# Patient Record
Sex: Male | Born: 1955 | Race: White | Hispanic: No | Marital: Married | Smoking: Never smoker
Health system: Southern US, Community
[De-identification: ages and names within clinical notes are randomized; demographics above are authoritative.]

## PROBLEM LIST (undated history)

## (undated) DIAGNOSIS — N400 Enlarged prostate without lower urinary tract symptoms: Secondary | ICD-10-CM

## (undated) DIAGNOSIS — K5190 Ulcerative colitis, unspecified, without complications: Secondary | ICD-10-CM

## (undated) DIAGNOSIS — K219 Gastro-esophageal reflux disease without esophagitis: Secondary | ICD-10-CM

## (undated) DIAGNOSIS — I10 Essential (primary) hypertension: Secondary | ICD-10-CM

## (undated) HISTORY — PX: TONSILLECTOMY: SUR1361

## (undated) HISTORY — PX: KNEE ARTHROSCOPY AND ARTHROTOMY: SUR84

---

## 2007-08-26 ENCOUNTER — Ambulatory Visit: Payer: Self-pay | Admitting: Surgery

## 2007-08-30 ENCOUNTER — Ambulatory Visit: Payer: Self-pay | Admitting: Surgery

## 2007-10-20 ENCOUNTER — Emergency Department: Payer: Self-pay | Admitting: Emergency Medicine

## 2007-10-20 ENCOUNTER — Other Ambulatory Visit: Payer: Self-pay

## 2008-02-16 ENCOUNTER — Ambulatory Visit: Payer: Self-pay | Admitting: Specialist

## 2008-10-16 ENCOUNTER — Ambulatory Visit: Payer: Self-pay | Admitting: Gastroenterology

## 2009-03-10 ENCOUNTER — Emergency Department: Payer: Self-pay | Admitting: Emergency Medicine

## 2009-05-24 ENCOUNTER — Ambulatory Visit: Payer: Self-pay | Admitting: Gastroenterology

## 2011-08-22 ENCOUNTER — Emergency Department: Payer: Self-pay | Admitting: Internal Medicine

## 2011-08-22 LAB — URINALYSIS, COMPLETE
Glucose,UR: NEGATIVE mg/dL (ref 0–75)
Leukocyte Esterase: NEGATIVE
Nitrite: NEGATIVE
Ph: 8 (ref 4.5–8.0)
Specific Gravity: 1.011 (ref 1.003–1.030)
Squamous Epithelial: NONE SEEN

## 2011-08-22 LAB — CBC
HCT: 40.7 % (ref 40.0–52.0)
HGB: 14.2 g/dL (ref 13.0–18.0)
MCHC: 34.8 g/dL (ref 32.0–36.0)
MCV: 95 fL (ref 80–100)
RBC: 4.27 10*6/uL — ABNORMAL LOW (ref 4.40–5.90)

## 2011-08-22 LAB — BASIC METABOLIC PANEL
Anion Gap: 6 — ABNORMAL LOW (ref 7–16)
BUN: 22 mg/dL — ABNORMAL HIGH (ref 7–18)
Calcium, Total: 8.5 mg/dL (ref 8.5–10.1)
Chloride: 106 mmol/L (ref 98–107)
EGFR (Non-African Amer.): >60
Potassium: 4 mmol/L (ref 3.5–5.1)
Sodium: 138 mmol/L (ref 136–145)

## 2011-08-22 LAB — AMYLASE: Amylase: 56 U/L (ref 25–115)

## 2012-03-08 DIAGNOSIS — N401 Enlarged prostate with lower urinary tract symptoms: Secondary | ICD-10-CM | POA: Insufficient documentation

## 2012-03-08 DIAGNOSIS — D075 Carcinoma in situ of prostate: Secondary | ICD-10-CM | POA: Insufficient documentation

## 2012-03-08 DIAGNOSIS — R972 Elevated prostate specific antigen [PSA]: Secondary | ICD-10-CM | POA: Insufficient documentation

## 2012-03-08 DIAGNOSIS — Z8042 Family history of malignant neoplasm of prostate: Secondary | ICD-10-CM | POA: Insufficient documentation

## 2012-03-08 DIAGNOSIS — N400 Enlarged prostate without lower urinary tract symptoms: Secondary | ICD-10-CM | POA: Diagnosis present

## 2012-12-03 ENCOUNTER — Ambulatory Visit: Payer: Self-pay | Admitting: Gastroenterology

## 2012-12-17 ENCOUNTER — Other Ambulatory Visit: Payer: Self-pay | Admitting: Family

## 2012-12-23 ENCOUNTER — Other Ambulatory Visit: Payer: Self-pay | Admitting: Family

## 2013-07-18 ENCOUNTER — Ambulatory Visit: Payer: Self-pay | Admitting: Gastroenterology

## 2014-05-10 ENCOUNTER — Emergency Department: Payer: Self-pay | Admitting: Emergency Medicine

## 2014-05-10 LAB — CBC
HCT: 41.6 % (ref 40.0–52.0)
HGB: 13.9 g/dL (ref 13.0–18.0)
MCH: 32.7 pg (ref 26.0–34.0)
MCHC: 33.5 g/dL (ref 32.0–36.0)
MCV: 98 fL (ref 80–100)
Platelet: 269 10*3/uL (ref 150–440)
RBC: 4.26 10*6/uL — ABNORMAL LOW (ref 4.40–5.90)
RDW: 12.9 % (ref 11.5–14.5)
WBC: 7.7 10*3/uL (ref 3.8–10.6)

## 2014-05-10 LAB — BASIC METABOLIC PANEL
Anion Gap: 4 — ABNORMAL LOW (ref 7–16)
BUN: 18 mg/dL (ref 7–18)
CALCIUM: 8.5 mg/dL (ref 8.5–10.1)
CHLORIDE: 104 mmol/L (ref 98–107)
Co2: 32 mmol/L (ref 21–32)
Creatinine: 0.94 mg/dL (ref 0.60–1.30)
EGFR (African American): >60
EGFR (Non-African Amer.): >60
Glucose: 90 mg/dL (ref 65–99)
OSMOLALITY: 281 (ref 275–301)
POTASSIUM: 4.5 mmol/L (ref 3.5–5.1)
Sodium: 140 mmol/L (ref 136–145)

## 2014-05-10 LAB — D-DIMER(ARMC): D-DIMER: 404 ng/mL

## 2014-05-10 LAB — TSH: Thyroid Stimulating Horm: 1.88 u[IU]/mL

## 2014-05-10 LAB — TROPONIN I: Troponin-I: <0.02 ng/mL

## 2015-01-02 DIAGNOSIS — M1711 Unilateral primary osteoarthritis, right knee: Secondary | ICD-10-CM | POA: Insufficient documentation

## 2015-12-09 ENCOUNTER — Emergency Department: Payer: No Typology Code available for payment source

## 2015-12-09 ENCOUNTER — Emergency Department
Admission: EM | Admit: 2015-12-09 | Discharge: 2015-12-09 | Disposition: A | Discharge status: Home / Self Care | Payer: No Typology Code available for payment source | Attending: Emergency Medicine | Admitting: Emergency Medicine

## 2015-12-09 DIAGNOSIS — H8149 Vertigo of central origin, unspecified ear: Secondary | ICD-10-CM | POA: Insufficient documentation

## 2015-12-09 DIAGNOSIS — M62838 Other muscle spasm: Secondary | ICD-10-CM | POA: Diagnosis not present

## 2015-12-09 DIAGNOSIS — R0789 Other chest pain: Secondary | ICD-10-CM | POA: Insufficient documentation

## 2015-12-09 DIAGNOSIS — R079 Chest pain, unspecified: Secondary | ICD-10-CM

## 2015-12-09 DIAGNOSIS — R42 Dizziness and giddiness: Secondary | ICD-10-CM

## 2015-12-09 HISTORY — DX: Ulcerative colitis, unspecified, without complications: K51.90

## 2015-12-09 LAB — CBC
HEMATOCRIT: 42 % (ref 40.0–52.0)
Hemoglobin: 14.7 g/dL (ref 13.0–18.0)
MCH: 32 pg (ref 26.0–34.0)
MCHC: 35.1 g/dL (ref 32.0–36.0)
MCV: 91.3 fL (ref 80.0–100.0)
PLATELETS: 266 10*3/uL (ref 150–440)
RBC: 4.6 MIL/uL (ref 4.40–5.90)
RDW: 13.4 % (ref 11.5–14.5)
WBC: 6.5 10*3/uL (ref 3.8–10.6)

## 2015-12-09 LAB — TROPONIN I
Troponin I: <0.03 ng/mL (ref <0.03)
Troponin I: <0.03 ng/mL (ref <0.03)

## 2015-12-09 LAB — BASIC METABOLIC PANEL
ANION GAP: 5 (ref 5–15)
BUN: 27 mg/dL — AB (ref 6–20)
CO2: 25 mmol/L (ref 22–32)
CREATININE: 0.84 mg/dL (ref 0.61–1.24)
Calcium: 8.8 mg/dL — ABNORMAL LOW (ref 8.9–10.3)
Chloride: 107 mmol/L (ref 101–111)
GFR calc Af Amer: >60 mL/min (ref >60)
GFR calc non Af Amer: >60 mL/min (ref >60)
GLUCOSE: 121 mg/dL — AB (ref 65–99)
POTASSIUM: 3.6 mmol/L (ref 3.5–5.1)
Sodium: 137 mmol/L (ref 135–145)

## 2015-12-09 MED ORDER — CYCLOBENZAPRINE HCL 10 MG PO TABS
5.0000 mg | ORAL_TABLET | Freq: Once | ORAL | Status: DC
  Filled 2015-12-09: qty 1

## 2015-12-09 MED ORDER — IOPAMIDOL (ISOVUE-370) INJECTION 76%
75.0000 mL | Freq: Once | INTRAVENOUS | Status: AC | PRN
  Administered 2015-12-09: 75 mL via INTRAVENOUS

## 2015-12-09 MED ORDER — MECLIZINE HCL 25 MG PO TABS: 25.0000 mg | ORAL_TABLET | Freq: Three times a day (TID) | ORAL | 0 refills | Status: DC | PRN

## 2015-12-09 MED ORDER — CYCLOBENZAPRINE HCL 10 MG PO TABS
10.0000 mg | ORAL_TABLET | Freq: Once | ORAL | Status: AC
  Administered 2015-12-09: 10 mg via ORAL

## 2015-12-09 MED ORDER — CYCLOBENZAPRINE HCL 10 MG PO TABS: 10.0000 mg | ORAL_TABLET | Freq: Three times a day (TID) | ORAL | 0 refills | Status: DC | PRN

## 2015-12-09 NOTE — ED Triage Notes (Signed)
Pt c/o feeling like heart is racing for the past 3 days with left sided chest pain that radiates into the left arm with SOB/.

## 2015-12-09 NOTE — ED Provider Notes (Signed)
[facility] Emergency Department Provider Note ____________________________________________   I have reviewed the triage vital signs and the triage nursing note.  HISTORY  Chief Complaint Palpitations   Historian Patient and spouse and son  HPI Samuel Roberts is a 60 y.o. male with hx of ulcerative colitis here for 2-3 days of "dizzy" spells.He states the first time that it happened he was leaning his head back putting contact solution in his eyes, when he raised his head up he felt lightheaded and extremely dizzy with some spinning sensation. He has felt fatigued since then. He is also having some pain in his left neck down into his left shoulder. Some mild chest discomfort on the left side, the first time he felt this was this morning, now or 2 before he came in. No shortness of breath. No nausea or vomiting. No numbness or tingling. No known traumatic injury, but he has previously broken the left shoulder in the past. He is also a Curator and left handed, and might have overused the left arm/shoulder.  He has had several other dizzy spells including this morning which makes him have to stop until he gets his bearings back. It's not every time he changes position, but he states that tilting his head back and forward can typically bring it on. He's never been diagnosed with vertigo or had an episode like this before in the past.  Denies recent illnesses including cough, fever, congestion, vomiting, or diarrhea. He reports generalized fatigue, but no specific exertional dyspnea.    Past Medical History:  Diagnosis Date  . Colitis, ulcerative ([place])     There are no active problems to display for this patient.   Past Surgical History:  Procedure Laterality Date  . KNEE ARTHROSCOPY AND ARTHROTOMY    . TONSILLECTOMY      Prior to Admission medications   Medication Sig Start Date End Date Taking? Authorizing Provider  cyclobenzaprine (FLEXERIL) 10 MG tablet  Take 1 tablet (10 mg total) by mouth 3 (three) times daily as needed for muscle spasms. 12/09/15   Lisa Roca, MD  meclizine (ANTIVERT) 25 MG tablet Take 1 tablet (25 mg total) by mouth 3 (three) times daily as needed for dizziness or nausea. 12/09/15   Lisa Roca, MD    No Known Allergies  No family history on file.  Social History Social History  Substance Use Topics  . Smoking status: Never Smoker  . Smokeless tobacco: Never Used  . Alcohol use No    Review of Systems  Constitutional: Negative for fever. Eyes: Negative for visual changes.  ENT: Negative for sore throat. Cardiovascular: Negative for pleuritic chest pain. Respiratory: Negative for shortness of breath. Gastrointestinal: Negative for abdominal pain, vomiting and diarrhea. Genitourinary: Negative for dysuria. Musculoskeletal: Negative for back pain. Skin: Negative for rash. Neurological: Negative for headache. 10 point Review of Systems otherwise negative ____________________________________________   PHYSICAL EXAM:  VITAL SIGNS: ED Triage Vitals [12/09/15 0827]  Enc Vitals Group     BP (!) 147/75     Pulse Rate 67     Resp 20     Temp 98 F (36.7 C)     Temp Source Oral     SpO2 98 %     Weight 190 lb (86.2 kg)     Height 5' 10"  (1.778 m)     Head Circumference      Peak Flow      Pain Score 4     Pain Loc  Pain Edu?      Excl. in [place]?      Constitutional: Alert and oriented. Well appearing and in no distress. HEENT   Head: Normocephalic and atraumatic.      Eyes: Conjunctivae are normal. PERRL. Normal extraocular movements.      Ears:         Nose: No congestion/rhinnorhea.   Mouth/Throat: Mucous membranes are moist.   Neck: No stridor. Cardiovascular/Chest: Normal rate, regular rhythm.  No murmurs, rubs, or gallops. Respiratory: Normal respiratory effort without tachypnea nor retractions. Breath sounds are clear and equal bilaterally. No  wheezes/rales/rhonchi. Gastrointestinal: Soft. No distention, no guarding, no rebound. Nontender.    Genitourinary/rectal:Deferred Musculoskeletal: Palpable and tender muscle spasm of left trapezius with twitching upon trigger point pressure.  Muscle spasm of left pectoralis muscle.  No peripheral edema or calf tenderness. Neurologic:  Normal speech and language. No gross or focal neurologic deficits are appreciated.  Heel to shin intact bilaterally. No facial droop. No aphasia. Cranial nerves II through X intact. 5 out of 5 strength in 4 extremities. Skin:  Skin is warm, dry and intact. No rash noted. Psychiatric: Mood and affect are normal. Speech and behavior are normal. Patient exhibits appropriate insight and judgment.  ____________________________________________   EKG I, Lisa Roca, MD, the attending physician have personally viewed and interpreted all ECGs.  69 bpm. Normal sinus rhythm. Narrow QRS. Normal axis. Normal ST and T-wave ____________________________________________  LABS (pertinent positives/negatives)  Labs Reviewed  BASIC METABOLIC PANEL - Abnormal; Notable for the following:       Result Value   Glucose, Bld 121 (*)    BUN 27 (*)    Calcium 8.8 (*)    All other components within normal limits  CBC  TROPONIN I  TROPONIN I    ____________________________________________  RADIOLOGY All Xrays were viewed by me. Imaging interpreted by Radiologist.  Chest x-ray two-view: No edema or consolidation. Small focus of left carotid artery calcification.  CT head without contrast, CT angiogram head and neck:  IMPRESSION: No extracranial or intracranial stenosis or occlusion is identified.  No acute intracranial findings or abnormal postcontrast enhancement. __________________________________________  PROCEDURES  Procedure(s) performed: None  Critical Care performed: None  ____________________________________________   ED COURSE / ASSESSMENT AND  PLAN  Pertinent labs & imaging results that were available during my care of the patient were reviewed by me and considered in my medical decision making (see chart for details).   The main reason the patient came in is multiple episodes of dizziness that sounded someways peripheral and position related, however given the left-sided neck discomfort, I discussed with patient and family obtaining CT head and CT angiogram of the head and neck.  Ultimately, I suspect his dizziness is peripheral vertigo and I will prescribe meclizine.  In terms of the left-sided chest and left shoulder discomfort, his EKG is reassuring, his troponins are reassuring, and his symptoms are very atypical, and he has findings of muscle spasms on the left side of his neck and chest. I will treat with Flexeril. I am additionally going to go ahead and refer him for cardiology evaluation of atypical chest discomfort as well.    CONSULTATIONS:  None  Patient / Family / Caregiver informed of clinical course, medical decision-making process, and agree with plan.   I discussed return precautions, follow-up instructions, and discharged instructions with patient and/or family.   ___________________________________________   FINAL CLINICAL IMPRESSION(S) / ED DIAGNOSES   Final diagnoses:  Dizzy spells  Trapezius muscle spasm  Chest pain, unspecified chest pain type  Vertigo              Note: This dictation was prepared with Dragon dictation. Any transcriptional errors that result from this process are unintentional    Lisa Roca, MD 12/09/15 1250

## 2015-12-09 NOTE — ED Notes (Signed)
Pt in via triage with complaints of left side chest discomfort that radiates into left shoulder x 3 days.  Pt also reports shortness of breath, dizziness, weakness, and "feeling like my heart is racing."  Pt with knot to left upper chest with tenderness upon palpation.  Pt denies any recent injury.  Pt A/Ox4, sinus brady, other vitals WDL, no immediate distress at this time.

## 2015-12-09 NOTE — Discharge Instructions (Signed)
You were evaluated for dizzy spells as well as left-sided neck and chest discomfort. Evaluation are reassuring in the emergency room today.  I suspect your dizzy spells are from vertigo, and your being prescribed meclizine to help her symptoms. If symptoms persist for more than another week, I would recommend you follow-up with a neurologist.  I suspect your left-sided neck and chest discomfort are from muscle spasm, and you are being prescribed a muscle relaxer medication called Flexeril.  However, as we discussed I am recommending that you follow-up with a cardiologist, called the office for Dr.Khan for appointment next 1-2 days.  Please follow up with your primary care physician or the [facility] clinic.  Return to the emergency department for any worsening chest pain, any associated shortness of breath, nausea, sweats, numbness, tingling, passing out, headache, seizure, confusion or altered mental status, or any other symptoms concerning to you.

## 2016-09-05 ENCOUNTER — Emergency Department: Payer: No Typology Code available for payment source

## 2016-09-05 ENCOUNTER — Observation Stay
Admission: EM | Admit: 2016-09-05 | Discharge: 2016-09-06 | Disposition: A | Discharge status: Home / Self Care | Payer: No Typology Code available for payment source | Attending: Internal Medicine | Admitting: Internal Medicine

## 2016-09-05 DIAGNOSIS — Z8249 Family history of ischemic heart disease and other diseases of the circulatory system: Secondary | ICD-10-CM | POA: Diagnosis not present

## 2016-09-05 DIAGNOSIS — R079 Chest pain, unspecified: Secondary | ICD-10-CM | POA: Diagnosis present

## 2016-09-05 DIAGNOSIS — N400 Enlarged prostate without lower urinary tract symptoms: Secondary | ICD-10-CM | POA: Diagnosis not present

## 2016-09-05 DIAGNOSIS — K5190 Ulcerative colitis, unspecified, without complications: Secondary | ICD-10-CM | POA: Diagnosis not present

## 2016-09-05 DIAGNOSIS — I209 Angina pectoris, unspecified: Principal | ICD-10-CM | POA: Insufficient documentation

## 2016-09-05 DIAGNOSIS — G8929 Other chronic pain: Secondary | ICD-10-CM | POA: Insufficient documentation

## 2016-09-05 DIAGNOSIS — K5010 Crohn's disease of large intestine without complications: Secondary | ICD-10-CM | POA: Diagnosis present

## 2016-09-05 DIAGNOSIS — Z79899 Other long term (current) drug therapy: Secondary | ICD-10-CM | POA: Diagnosis not present

## 2016-09-05 DIAGNOSIS — I700 Atherosclerosis of aorta: Secondary | ICD-10-CM | POA: Diagnosis not present

## 2016-09-05 DIAGNOSIS — K50111 Crohn's disease of large intestine with rectal bleeding: Secondary | ICD-10-CM

## 2016-09-05 DIAGNOSIS — M545 Low back pain: Secondary | ICD-10-CM | POA: Insufficient documentation

## 2016-09-05 HISTORY — DX: Benign prostatic hyperplasia without lower urinary tract symptoms: N40.0

## 2016-09-05 LAB — COMPREHENSIVE METABOLIC PANEL
ALBUMIN: 4.1 g/dL (ref 3.5–5.0)
ALT: 15 U/L — ABNORMAL LOW (ref 17–63)
ANION GAP: 8 (ref 5–15)
AST: 27 U/L (ref 15–41)
Alkaline Phosphatase: 62 U/L (ref 38–126)
BILIRUBIN TOTAL: 0.5 mg/dL (ref 0.3–1.2)
BUN: 16 mg/dL (ref 6–20)
CO2: 24 mmol/L (ref 22–32)
Calcium: 9.3 mg/dL (ref 8.9–10.3)
Chloride: 104 mmol/L (ref 101–111)
Creatinine, Ser: 0.67 mg/dL (ref 0.61–1.24)
GFR calc Af Amer: >60 mL/min (ref >60)
Glucose, Bld: 157 mg/dL — ABNORMAL HIGH (ref 65–99)
POTASSIUM: 4.1 mmol/L (ref 3.5–5.1)
Sodium: 136 mmol/L (ref 135–145)
TOTAL PROTEIN: 7.9 g/dL (ref 6.5–8.1)

## 2016-09-05 LAB — CBC
HCT: 41.8 % (ref 40.0–52.0)
Hemoglobin: 14.8 g/dL (ref 13.0–18.0)
MCH: 31.7 pg (ref 26.0–34.0)
MCHC: 35.5 g/dL (ref 32.0–36.0)
MCV: 89.2 fL (ref 80.0–100.0)
PLATELETS: 335 10*3/uL (ref 150–440)
RBC: 4.68 MIL/uL (ref 4.40–5.90)
RDW: 13 % (ref 11.5–14.5)
WBC: 7.9 10*3/uL (ref 3.8–10.6)

## 2016-09-05 LAB — FIBRIN DERIVATIVES D-DIMER (ARMC ONLY): FIBRIN DERIVATIVES D-DIMER (ARMC): 527.76 — AB (ref 0.00–499.00)

## 2016-09-05 LAB — TROPONIN I: Troponin I: <0.03 ng/mL (ref <0.03)

## 2016-09-05 MED ORDER — ASPIRIN 81 MG PO CHEW
CHEWABLE_TABLET | ORAL | Status: AC
  Filled 2016-09-05: qty 4

## 2016-09-05 MED ORDER — NITROGLYCERIN 0.4 MG SL SUBL
0.4000 mg | SUBLINGUAL_TABLET | SUBLINGUAL | Status: DC | PRN
  Administered 2016-09-05 (×2): 0.4 mg via SUBLINGUAL
  Filled 2016-09-05 (×2): qty 1

## 2016-09-05 MED ORDER — ASPIRIN 81 MG PO CHEW
324.0000 mg | CHEWABLE_TABLET | Freq: Once | ORAL | Status: AC
  Administered 2016-09-05: 324 mg via ORAL

## 2016-09-05 NOTE — ED Triage Notes (Signed)
Patient c/o left sided chest tightness, left arm pain/numbness, diaphoresis, SOB beginning today. Pt reports family history of MI.

## 2016-09-05 NOTE — ED Provider Notes (Signed)
[facility] Emergency Department Provider Note    First MD Initiated Contact with Patient 09/05/16 2251     (approximate)  I have reviewed the triage vital signs and the nursing notes.   HISTORY  Chief Complaint Chest Pain   HPI Samuel Roberts is a 61 y.o. male with history of ulcerative colitis presents to the emergency department with acute onset of left-sided chest pain radiating to left arm 3 hours. Patient states that the pain has been persistent with onset at rest. Patient describes pain as tightness. Patient denies any lower extremity pain swelling. Patient admits to dyspnea "lightheadedness", diaphoresis. Patient denies any personal history of coronary artery disease however does admit that his mother father and one of his brothers suffer from coronary artery disease and myocardial infarction.   Past Medical History:  Diagnosis Date  . BPH (benign prostatic hyperplasia)   . Colitis, ulcerative ([place])     Patient Active Problem List   Diagnosis Date Noted  . Angina pectoris ([place]) 09/06/2016  . UC (ulcerative colitis) ([place]) 09/06/2016  . BPH (benign prostatic hyperplasia) 09/06/2016    Past Surgical History:  Procedure Laterality Date  . JOINT REPLACEMENT    . KNEE ARTHROSCOPY AND ARTHROTOMY    . TONSILLECTOMY      Prior to Admission medications   Medication Sig Start Date End Date Taking? Authorizing Provider  finasteride (PROSCAR) 5 MG tablet Take 5 mg by mouth every other day.  08/04/16  Yes [provider]  mesalamine (LIALDA) 1.2 g EC tablet Take 3 tablets by mouth daily.  09/03/16  Yes [provider]  pantoprazole (PROTONIX) 40 MG tablet Take 40 mg by mouth every other day.  08/04/16  Yes [provider]  traMADol (ULTRAM) 50 MG tablet Take 50 mg by mouth every 6 (six) hours as needed for moderate pain.  08/21/16  Yes [provider]    Allergies No known drug allergies  Family History  Problem  Relation Age of Onset  . Heart failure Mother   . Heart failure Father     Social History Social History  Substance Use Topics  . Smoking status: Never Smoker  . Smokeless tobacco: Never Used  . Alcohol use No    Review of Systems Constitutional: No fever/chills Eyes: No visual changes. ENT: No sore throat. Cardiovascular: Positive for chest pain. Respiratory: Positive for shortness of breath. Gastrointestinal: No abdominal pain.  No nausea, no vomiting.  No diarrhea.  No constipation. Genitourinary: Negative for dysuria. Musculoskeletal: Negative for neck pain.  Negative for back pain. Integumentary: Negative for rash. Neurological: Negative for headaches, focal weakness or numbness. Positive for lightheadedness   ____________________________________________   PHYSICAL EXAM:  VITAL SIGNS: ED Triage Vitals  Enc Vitals Group     BP 09/05/16 2300 (!) 153/99     Pulse Rate 09/05/16 2300 98     Resp 09/05/16 2300 16     Temp --      Temp src --      SpO2 09/05/16 2300 96 %     Weight 09/05/16 2236 200 lb (90.7 kg)     Height 09/05/16 2236 5' 10"  (1.778 m)     Head Circumference --      Peak Flow --      Pain Score 09/05/16 2235 7     Pain Loc --      Pain Edu? --      Excl. in [place]? --     Constitutional:  Alert and oriented. Well appearing and in no acute distress. Eyes: Conjunctivae are normal.  Head: Atraumatic. Mouth/Throat: Mucous membranes are moist. Neck: No stridor.   Cardiovascular: Normal rate, regular rhythm. Good peripheral circulation. Grossly normal heart sounds. Respiratory: Normal respiratory effort.  No retractions. Lungs CTAB. Gastrointestinal: Soft and nontender. No distention.  Musculoskeletal: No lower extremity tenderness nor edema. No gross deformities of extremities. Neurologic:  Normal speech and language. No gross focal neurologic deficits are appreciated.  Skin:  Skin is warm, dry and intact. No rash noted. Psychiatric: Mood and affect  are normal. Speech and behavior are normal.  ____________________________________________   LABS (all labs ordered are listed, but only abnormal results are displayed)  Labs Reviewed  COMPREHENSIVE METABOLIC PANEL - Abnormal; Notable for the following:       Result Value   Glucose, Bld 157 (*)    ALT 15 (*)    All other components within normal limits  FIBRIN DERIVATIVES D-DIMER ([facility] ONLY) - Abnormal; Notable for the following:    Fibrin derivatives D-dimer ([facility]) 527.76 (*)    All other components within normal limits  CBC  TROPONIN I  TROPONIN I   ____________________________________________  EKG  ED ECG REPORT I, [place] N Brallan Denio, the attending physician, personally viewed and interpreted this ECG.   Date: 09/05/2016  EKG Time: 10:37PM  Rate: 104  Rhythm: Sinus tachycardia  Axis: Normal  Intervals: Normal  ST&T Change: None  ____________________________________________  RADIOLOGY I, [place] N Raechell Singleton, personally viewed and evaluated these images (plain radiographs) as part of my medical decision making, as well as reviewing the written report by the radiologist.  Dg Chest 2 View  Result Date: 09/05/2016 CLINICAL DATA:  Left-sided chest tightness EXAM: CHEST  2 VIEW COMPARISON:  12/09/2015 FINDINGS: The heart size and mediastinal contours are within normal limits. Both lungs are clear. The visualized skeletal structures are unremarkable. IMPRESSION: No active cardiopulmonary disease. Electronically Signed   By: Ashley Royalty M.D.   On: 09/05/2016 23:03   Ct Angio Chest Pe W Or Wo Contrast  Result Date: 09/06/2016 CLINICAL DATA:  Left-sided chest pain. EXAM: CT ANGIOGRAPHY CHEST WITH CONTRAST TECHNIQUE: Multidetector CT imaging of the chest was performed using the standard protocol during bolus administration of intravenous contrast. Multiplanar CT image reconstructions and MIPs were obtained to evaluate the vascular anatomy. CONTRAST:  75 cc Isovue 370 IV COMPARISON:   Radiographs 2 hours prior. FINDINGS: Cardiovascular: There are no filling defects within the pulmonary arteries to suggest pulmonary embolus. Tortuous thoracic aorta with mild atherosclerosis, no aneurysm or dissection. Heart is upper normal in size. Mediastinum/Nodes: No mediastinal, hilar, or axillary adenopathy. The esophagus is decompressed. Trachea and mainstem bronchi are patent. Lungs/Pleura: Hypoventilatory atelectasis at the lung bases. No consolidation. No pulmonary edema. No pleural fluid. No pulmonary mass. Upper Abdomen: Unremarkable.  Ingested pills in the stomach. Musculoskeletal: There are no acute or suspicious osseous abnormalities. Review of the MIP images confirms the above findings. IMPRESSION: 1. No pulmonary embolus.  No acute abnormality. 2. Mild aortic atherosclerosis. Electronically Signed   By: Jeb Levering M.D.   On: 09/06/2016 00:57     Procedures   ____________________________________________   INITIAL IMPRESSION / ASSESSMENT AND PLAN / ED COURSE  Pertinent labs & imaging results that were available during my care of the patient were reviewed by me and considered in my medical decision making (see chart for details).  Given history of physical exam concern for possible cardiac etiology for the patient's chest pain.  As such patient was discussed with Dr. Jannifer [place] for hospital admission for further evaluation and management. Patient was given aspirin 324 mg in the emergency department as well as sublingual nitroglycerin with improvement of pain      ____________________________________________  FINAL CLINICAL IMPRESSION(S) / ED DIAGNOSES  Final diagnoses:  Chest pain     MEDICATIONS GIVEN DURING THIS VISIT:  Medications  nitroGLYCERIN (NITROSTAT) SL tablet 0.4 mg (0.4 mg Sublingual Given 09/05/16 2316)  aspirin chewable tablet 324 mg (324 mg Oral Given 09/05/16 2312)  iopamidol (ISOVUE-370) 76 % injection 75 mL (75 mLs Intravenous Contrast Given 09/06/16  0040)     NEW OUTPATIENT MEDICATIONS STARTED DURING THIS VISIT:  New Prescriptions   No medications on file    Modified Medications   No medications on file    Discontinued Medications   CYCLOBENZAPRINE (FLEXERIL) 10 MG TABLET    Take 1 tablet (10 mg total) by mouth 3 (three) times daily as needed for muscle spasms.   MECLIZINE (ANTIVERT) 25 MG TABLET    Take 1 tablet (25 mg total) by mouth 3 (three) times daily as needed for dizziness or nausea.     Note:  This document was prepared using Dragon voice recognition software and may include unintentional dictation errors.    Gregor Hams, MD 09/06/16 [phone]

## 2016-09-06 ENCOUNTER — Encounter: Payer: Self-pay | Admitting: Internal Medicine

## 2016-09-06 ENCOUNTER — Emergency Department: Payer: No Typology Code available for payment source

## 2016-09-06 ENCOUNTER — Observation Stay (HOSPITAL_BASED_OUTPATIENT_CLINIC_OR_DEPARTMENT_OTHER)
Admit: 2016-09-06 | Discharge: 2016-09-06 | Disposition: A | Discharge status: Home / Self Care | Payer: No Typology Code available for payment source | Attending: Internal Medicine | Admitting: Internal Medicine

## 2016-09-06 DIAGNOSIS — K5010 Crohn's disease of large intestine without complications: Secondary | ICD-10-CM | POA: Diagnosis present

## 2016-09-06 DIAGNOSIS — R072 Precordial pain: Secondary | ICD-10-CM | POA: Diagnosis not present

## 2016-09-06 DIAGNOSIS — K50111 Crohn's disease of large intestine with rectal bleeding: Secondary | ICD-10-CM

## 2016-09-06 DIAGNOSIS — I209 Angina pectoris, unspecified: Secondary | ICD-10-CM | POA: Diagnosis present

## 2016-09-06 LAB — CBC
HCT: 40.7 % (ref 40.0–52.0)
Hemoglobin: 14.1 g/dL (ref 13.0–18.0)
MCH: 31.1 pg (ref 26.0–34.0)
MCHC: 34.5 g/dL (ref 32.0–36.0)
MCV: 90.1 fL (ref 80.0–100.0)
PLATELETS: 306 10*3/uL (ref 150–440)
RBC: 4.52 MIL/uL (ref 4.40–5.90)
RDW: 13.1 % (ref 11.5–14.5)
WBC: 9.7 10*3/uL (ref 3.8–10.6)

## 2016-09-06 LAB — TROPONIN I: Troponin I: <0.03 ng/mL (ref <0.03)

## 2016-09-06 LAB — BASIC METABOLIC PANEL
Anion gap: 7 (ref 5–15)
BUN: 17 mg/dL (ref 6–20)
CALCIUM: 9.3 mg/dL (ref 8.9–10.3)
CHLORIDE: 103 mmol/L (ref 101–111)
CO2: 28 mmol/L (ref 22–32)
CREATININE: 0.73 mg/dL (ref 0.61–1.24)
GFR calc non Af Amer: >60 mL/min (ref >60)
Glucose, Bld: 143 mg/dL — ABNORMAL HIGH (ref 65–99)
Potassium: 5.1 mmol/L (ref 3.5–5.1)
Sodium: 138 mmol/L (ref 135–145)

## 2016-09-06 LAB — ECHOCARDIOGRAM COMPLETE
Height: 70 in
WEIGHTICAEL: 3305.6 [oz_av]

## 2016-09-06 MED ORDER — ASPIRIN 81 MG PO CHEW: 81.0000 mg | CHEWABLE_TABLET | Freq: Every day | ORAL | Status: DC

## 2016-09-06 MED ORDER — ONDANSETRON HCL 4 MG PO TABS: 4.0000 mg | ORAL_TABLET | Freq: Four times a day (QID) | ORAL | Status: DC | PRN

## 2016-09-06 MED ORDER — ACETAMINOPHEN 325 MG PO TABS: 650.0000 mg | ORAL_TABLET | Freq: Four times a day (QID) | ORAL | Status: DC | PRN

## 2016-09-06 MED ORDER — ENOXAPARIN SODIUM 40 MG/0.4ML ~~LOC~~ SOLN: 40.0000 mg | SUBCUTANEOUS | Status: DC

## 2016-09-06 MED ORDER — MESALAMINE 1.2 G PO TBEC
4.8000 g | DELAYED_RELEASE_TABLET | Freq: Every day | ORAL | Status: DC
  Filled 2016-09-06: qty 4

## 2016-09-06 MED ORDER — FINASTERIDE 5 MG PO TABS: 5.0000 mg | ORAL_TABLET | Freq: Every day | ORAL | Status: DC

## 2016-09-06 MED ORDER — NITROGLYCERIN 0.4 MG SL SUBL: 0.4000 mg | SUBLINGUAL_TABLET | SUBLINGUAL | 0 refills | Status: DC | PRN

## 2016-09-06 MED ORDER — ASPIRIN 81 MG PO CHEW: 81.0000 mg | CHEWABLE_TABLET | Freq: Every day | ORAL | 2 refills | Status: DC

## 2016-09-06 MED ORDER — IOPAMIDOL (ISOVUE-370) INJECTION 76%
75.0000 mL | Freq: Once | INTRAVENOUS | Status: AC | PRN
  Administered 2016-09-06: 75 mL via INTRAVENOUS

## 2016-09-06 MED ORDER — ONDANSETRON HCL 4 MG/2ML IJ SOLN: 4.0000 mg | Freq: Four times a day (QID) | INTRAMUSCULAR | Status: DC | PRN

## 2016-09-06 MED ORDER — ATORVASTATIN CALCIUM 20 MG PO TABS: 40.0000 mg | ORAL_TABLET | Freq: Every day | ORAL | Status: DC

## 2016-09-06 MED ORDER — TRAMADOL HCL 50 MG PO TABS
50.0000 mg | ORAL_TABLET | Freq: Four times a day (QID) | ORAL | Status: DC | PRN
Start: 2016-09-06 — End: 2016-09-06

## 2016-09-06 MED ORDER — ACETAMINOPHEN 650 MG RE SUPP: 650.0000 mg | Freq: Four times a day (QID) | RECTAL | Status: DC | PRN

## 2016-09-06 MED ORDER — PANTOPRAZOLE SODIUM 40 MG PO TBEC: 40.0000 mg | DELAYED_RELEASE_TABLET | Freq: Every day | ORAL | Status: DC

## 2016-09-06 NOTE — Progress Notes (Signed)
Patient discharged to home with his wife.  Discharge instructions included new medications (Nitro SL), explained how and when to take it. He did not appear to be paying attention, he made NO eye contact with me.  He also made very little eye contact with Dr. Tressia Miners when she made rounds.  He was impatient when I asked if he had questions.  I let him know he was scheduled for a Myoview stress test Monday at 0830.  He said he was too busy and could have it.  His wife asked me to not cancel it.  I will leave a message for Nuclear Medicine that he may be a "no show".

## 2016-09-06 NOTE — Plan of Care (Signed)
Problem: Health Behavior/Discharge Planning: Goal: Ability to manage health-related needs will improve Outcome: Completed/Met Date Met: 09/06/16 Discharge instructions complete   

## 2016-09-06 NOTE — Progress Notes (Signed)
[facility] at [place] NAME: Samuel Roberts    MR#:  [id number]  DATE OF BIRTH:  [birth year]  SUBJECTIVE:  CHIEF COMPLAINT:   Chief Complaint  Patient presents with  . Chest Pain   -Admitted with substernal chest tightness and also left arm pain and tingling. Resolved after sublingual nitroglycerin. -For stress test this morning  REVIEW OF SYSTEMS:  Review of Systems  Constitutional: Negative for chills, fever and malaise/fatigue.  HENT: Negative for ear discharge, ear pain, hearing loss and nosebleeds.   Respiratory: Negative for cough, shortness of breath and wheezing.   Cardiovascular: Positive for chest pain. Negative for palpitations and leg swelling.  Gastrointestinal: Negative for abdominal pain, constipation, diarrhea, nausea and vomiting.  Genitourinary: Negative for dysuria.  Musculoskeletal: Positive for back pain. Negative for myalgias.  Neurological: Negative for dizziness, sensory change, speech change, focal weakness, seizures and headaches.  Psychiatric/Behavioral: Negative for depression.    DRUG ALLERGIES:  No Known Allergies  VITALS:  Blood pressure 133/78, pulse 61, temperature 98.1 F (36.7 C), temperature source Oral, resp. rate 16, height 5' 10"  (1.778 m), weight 93.7 kg (206 lb 9.6 oz), SpO2 94 %.  PHYSICAL EXAMINATION:  Physical Exam  GENERAL:  61 y.o.-year-old patient lying in the bed with no acute distress.  EYES: Pupils equal, round, reactive to light and accommodation. No scleral icterus. Extraocular muscles intact.  HEENT: Head atraumatic, normocephalic. Oropharynx and nasopharynx clear.  NECK:  Supple, no jugular venous distention. No thyroid enlargement, no tenderness.  LUNGS: Normal breath sounds bilaterally, no wheezing, rales,rhonchi or crepitation. No use of accessory muscles of respiration.  CARDIOVASCULAR: S1, S2 normal. No murmurs, rubs, or gallops.  ABDOMEN: Soft, nontender, nondistended.  Bowel sounds present. No organomegaly or mass.  EXTREMITIES: No pedal edema, cyanosis, or clubbing.  NEUROLOGIC: Cranial nerves II through XII are intact. Muscle strength 5/5 in all extremities. Sensation intact. Gait not checked.  PSYCHIATRIC: The patient is alert and oriented x 3.  SKIN: No obvious rash, lesion, or ulcer.    LABORATORY PANEL:   CBC  Recent Labs Lab 09/06/16 0202  WBC 9.7  HGB 14.1  HCT 40.7  PLT 306   ------------------------------------------------------------------------------------------------------------------  Chemistries   Recent Labs Lab 09/05/16 2255 09/06/16 0202  NA 136 138  K 4.1 5.1  CL 104 103  CO2 24 28  GLUCOSE 157* 143*  BUN 16 17  CREATININE 0.67 0.73  CALCIUM 9.3 9.3  AST 27  --   ALT 15*  --   ALKPHOS 62  --   BILITOT 0.5  --    ------------------------------------------------------------------------------------------------------------------  Cardiac Enzymes  Recent Labs Lab 09/06/16 0721  TROPONINI <0.03   ------------------------------------------------------------------------------------------------------------------  RADIOLOGY:  Dg Chest 2 View  Result Date: 09/05/2016 CLINICAL DATA:  Left-sided chest tightness EXAM: CHEST  2 VIEW COMPARISON:  12/09/2015 FINDINGS: The heart size and mediastinal contours are within normal limits. Both lungs are clear. The visualized skeletal structures are unremarkable. IMPRESSION: No active cardiopulmonary disease. Electronically Signed   By: Ashley Royalty M.D.   On: 09/05/2016 23:03   Ct Angio Chest Pe W Or Wo Contrast  Result Date: 09/06/2016 CLINICAL DATA:  Left-sided chest pain. EXAM: CT ANGIOGRAPHY CHEST WITH CONTRAST TECHNIQUE: Multidetector CT imaging of the chest was performed using the standard protocol during bolus administration of intravenous contrast. Multiplanar CT image reconstructions and MIPs were obtained to evaluate the vascular anatomy. CONTRAST:  75 cc Isovue 370 IV  COMPARISON:  Radiographs 2  hours prior. FINDINGS: Cardiovascular: There are no filling defects within the pulmonary arteries to suggest pulmonary embolus. Tortuous thoracic aorta with mild atherosclerosis, no aneurysm or dissection. Heart is upper normal in size. Mediastinum/Nodes: No mediastinal, hilar, or axillary adenopathy. The esophagus is decompressed. Trachea and mainstem bronchi are patent. Lungs/Pleura: Hypoventilatory atelectasis at the lung bases. No consolidation. No pulmonary edema. No pleural fluid. No pulmonary mass. Upper Abdomen: Unremarkable.  Ingested pills in the stomach. Musculoskeletal: There are no acute or suspicious osseous abnormalities. Review of the MIP images confirms the above findings. IMPRESSION: 1. No pulmonary embolus.  No acute abnormality. 2. Mild aortic atherosclerosis. Electronically Signed   By: Jeb Levering M.D.   On: 09/06/2016 00:57    EKG:   Orders placed or performed during the hospital encounter of 09/05/16  . ED EKG within 10 minutes  . ED EKG within 10 minutes  . EKG 12-Lead  . EKG 12-Lead    ASSESSMENT AND PLAN:   61 year old male with past medical history significant for prostatic hypertrophy, ulcerative colitis presents to the hospital secondary to sudden onset of chest pain and left arm pain.  #1 chest pain-sounds very typical of angina. Has had prior episodes of occasional chest pain. -Troponins are negative. Ruled out for acute MI. -Echocardiogram and cardiology consult are pending. -ordered Myoview. -Started on aspirin and statin. Follow-up lipid profile. No other factors noted.  #2 chronic low back pain-status post steroid injection in the lower back yesterday. -Continue when necessary tramadol  #3 ulcerative colitis-stable. Continue mesalamine  #4 BPH-on Proscar.  #5 DVT prophylaxis-Lovenox     All the records are reviewed and case discussed with Care Management/Social Workerr. Management plans discussed with the patient,  family and they are in agreement.  CODE STATUS:  Full code  TOTAL TIME TAKING CARE OF THIS PATIENT: 37 minutes.   POSSIBLE D/C IN 1-2 DAYS, DEPENDING ON CLINICAL CONDITION.   Gladstone Lighter M.D on 09/06/2016 at 10:27 AM  Between 7am to 6pm - Pager - [phone]  After 6pm go to [email or web address] - password [email or web address] [facility]  Office  [phone]  CC: Primary care physician; [name], MD

## 2016-09-06 NOTE — H&P (Signed)
[facility] at [place] NAME: Samuel Roberts    MR#:  [id number]  DATE OF BIRTH:  [birth year]  DATE OF ADMISSION:  09/05/2016  PRIMARY CARE PHYSICIAN: [name], MD   REQUESTING/REFERRING PHYSICIAN: Owens Shark, MD  CHIEF COMPLAINT:   Chief Complaint  Patient presents with  . Chest Pain    HISTORY OF PRESENT ILLNESS:  Samuel Roberts  is a 61 y.o. male who presents with An episode of central left-sided chest pain with radiation to his arm. This started earlier this afternoon, was accompanied with some mild shortness of breath and diaphoresis. Chest pain eased and resolved with administration of sublingual nitroglycerin. Patient has no prior history of heart disease or chest pain the past, but does have strong family history of cardiac disease. Initial cardiac workup in the ED was within normal limits, but given his symptoms hospitalists were called for admission and further evaluation  PAST MEDICAL HISTORY:   Past Medical History:  Diagnosis Date  . BPH (benign prostatic hyperplasia)   . Colitis, ulcerative ([place])     PAST SURGICAL HISTORY:   Past Surgical History:  Procedure Laterality Date  . JOINT REPLACEMENT    . KNEE ARTHROSCOPY AND ARTHROTOMY    . TONSILLECTOMY      SOCIAL HISTORY:   Social History  Substance Use Topics  . Smoking status: Never Smoker  . Smokeless tobacco: Never Used  . Alcohol use No    FAMILY HISTORY:   Family History  Problem Relation Age of Onset  . Heart failure Mother   . Heart failure Father     DRUG ALLERGIES:  No Known Allergies  MEDICATIONS AT HOME:   Prior to Admission medications   Medication Sig Start Date End Date Taking? Authorizing Provider  cyclobenzaprine (FLEXERIL) 10 MG tablet Take 1 tablet (10 mg total) by mouth 3 (three) times daily as needed for muscle spasms. 12/09/15   Lisa Roca, MD  finasteride (PROSCAR) 5 MG tablet Take 5 mg by mouth daily. 08/04/16    [provider]  meclizine (ANTIVERT) 25 MG tablet Take 1 tablet (25 mg total) by mouth 3 (three) times daily as needed for dizziness or nausea. 12/09/15   Lisa Roca, MD  mesalamine (LIALDA) 1.2 g EC tablet Take 4 tablets by mouth daily. 09/03/16   [provider]  pantoprazole (PROTONIX) 40 MG tablet Take 40 mg by mouth daily. 08/04/16   [provider]  traMADol (ULTRAM) 50 MG tablet Take 50 mg by mouth every 6 (six) hours as needed for moderate pain.  08/21/16   [provider]    REVIEW OF SYSTEMS:  Review of Systems  Constitutional: Positive for diaphoresis. Negative for chills, fever, malaise/fatigue and weight loss.  HENT: Negative for ear pain, hearing loss and tinnitus.   Eyes: Negative for blurred vision, double vision, pain and redness.  Respiratory: Positive for shortness of breath. Negative for cough and hemoptysis.   Cardiovascular: Positive for chest pain. Negative for palpitations, orthopnea and leg swelling.  Gastrointestinal: Negative for abdominal pain, constipation, diarrhea, nausea and vomiting.  Genitourinary: Negative for dysuria, frequency and hematuria.  Musculoskeletal: Negative for back pain, joint pain and neck pain.  Skin:       No acne, rash, or lesions  Neurological: Negative for dizziness, tremors, focal weakness and weakness.  Endo/Heme/Allergies: Negative for polydipsia. Does not bruise/bleed easily.  Psychiatric/Behavioral: Negative for depression. The patient is not nervous/anxious and does not have insomnia.  VITAL SIGNS:   Vitals:   09/05/16 2236 09/05/16 2300 09/05/16 2336 09/06/16 0027  BP:  (!) 153/99 129/89 116/87  Pulse:  98 96 81  Resp:  16 19 16   SpO2:  96% 96% 96%  Weight: 90.7 kg (200 lb)     Height: 5' 10"  (1.778 m)      Wt Readings from Last 3 Encounters:  09/05/16 90.7 kg (200 lb)  12/09/15 86.2 kg (190 lb)    PHYSICAL EXAMINATION:  Physical Exam  Vitals reviewed. Constitutional: He is  oriented to person, place, and time. He appears well-developed and well-nourished. No distress.  HENT:  Head: Normocephalic and atraumatic.  Mouth/Throat: Oropharynx is clear and moist.  Eyes: Conjunctivae and EOM are normal. Pupils are equal, round, and reactive to light. No scleral icterus.  Neck: Normal range of motion. Neck supple. No JVD present. No thyromegaly present.  Cardiovascular: Normal rate, regular rhythm and intact distal pulses.  Exam reveals no gallop and no friction rub.   No murmur heard. Respiratory: Effort normal and breath sounds normal. No respiratory distress. He has no wheezes. He has no rales.  GI: Soft. Bowel sounds are normal. He exhibits no distension. There is no tenderness.  Musculoskeletal: Normal range of motion. He exhibits no edema.  No arthritis, no gout  Lymphadenopathy:    He has no cervical adenopathy.  Neurological: He is alert and oriented to person, place, and time. No cranial nerve deficit.  No dysarthria, no aphasia  Skin: Skin is warm and dry. No rash noted. No erythema.  Psychiatric: He has a normal mood and affect. His behavior is normal. Judgment and thought content normal.    LABORATORY PANEL:   CBC  Recent Labs Lab 09/05/16 2255  WBC 7.9  HGB 14.8  HCT 41.8  PLT 335   ------------------------------------------------------------------------------------------------------------------  Chemistries   Recent Labs Lab 09/05/16 2255  NA 136  K 4.1  CL 104  CO2 24  GLUCOSE 157*  BUN 16  CREATININE 0.67  CALCIUM 9.3  AST 27  ALT 15*  ALKPHOS 62  BILITOT 0.5   ------------------------------------------------------------------------------------------------------------------  Cardiac Enzymes  Recent Labs Lab 09/05/16 2255  TROPONINI <0.03   ------------------------------------------------------------------------------------------------------------------  RADIOLOGY:  Dg Chest 2 View  Result Date: 09/05/2016 CLINICAL  DATA:  Left-sided chest tightness EXAM: CHEST  2 VIEW COMPARISON:  12/09/2015 FINDINGS: The heart size and mediastinal contours are within normal limits. Both lungs are clear. The visualized skeletal structures are unremarkable. IMPRESSION: No active cardiopulmonary disease. Electronically Signed   By: Ashley Royalty M.D.   On: 09/05/2016 23:03    EKG:   Orders placed or performed during the hospital encounter of 09/05/16  . ED EKG within 10 minutes  . ED EKG within 10 minutes  . EKG 12-Lead  . EKG 12-Lead    IMPRESSION AND PLAN:  Principal Problem:   Angina pectoris ([place]) - patient's symptoms are typical for cardiac and his chest pain. Initial workup in the ED is within normal limits.  We will trend his cardiac enzymes tonight and then get an echocardiogram and a cardiology consult in the morning Active Problems:   UC (ulcerative colitis) ([place]) - continue home meds   BPH (benign prostatic hyperplasia) - continue home medications  All the records are reviewed and case discussed with ED provider. Management plans discussed with the patient and/or family.  DVT PROPHYLAXIS: SubQ lovenox  GI PROPHYLAXIS: PPI  ADMISSION STATUS: Observation  CODE STATUS: Full Code Status History  This patient does not have a recorded code status. Please follow your organizational policy for patients in this situation.      TOTAL TIME TAKING CARE OF THIS PATIENT: 40 minutes.   Cru Kritikos Wailuku 09/06/2016, 12:42 AM  [facility]  Office  [phone]  CC: Primary care physician; [name], MD  Note:  This document was prepared using Dragon voice recognition software and may include unintentional dictation errors.

## 2016-09-06 NOTE — Progress Notes (Signed)
*  PRELIMINARY RESULTS* Echocardiogram 2D Echocardiogram has been performed.  Lavell Luster Ritvik Mczeal 09/06/2016, 8:05 AM

## 2016-09-07 NOTE — Discharge Summary (Signed)
[facility] at [place] NAME: Samuel Roberts    MR#:  [id number]  DATE OF BIRTH:  [birth year]  DATE OF ADMISSION:  09/05/2016   ADMITTING PHYSICIAN: Lance Coon, MD  DATE OF DISCHARGE: 09/06/2016  2:00 PM  PRIMARY CARE PHYSICIAN: [name], MD   ADMISSION DIAGNOSIS:   Chest pain [R07.9]  DISCHARGE DIAGNOSIS:   Principal Problem:   Angina pectoris ([place]) Active Problems:   UC (ulcerative colitis) ([place])   BPH (benign prostatic hyperplasia)   SECONDARY DIAGNOSIS:   Past Medical History:  Diagnosis Date  . BPH (benign prostatic hyperplasia)   . Colitis, ulcerative [facility])     HOSPITAL COURSE:   61 year old male with past medical history significant for prostatic hypertrophy, ulcerative colitis presents to the hospital secondary to sudden onset of chest pain and left arm pain.  #1 chest pain-sounds very typical of angina. Has had prior episodes of occasional chest pain. -Troponins are negative. Ruled out for acute MI. -Echocardiogram done and was completely normal with normal LV ejection fraction. -Myoview view was ordered, however patient did not want to stay for Myoview and monitor get that study done as an outpatient. Cardiology follow up as outpatient as well as patient wanted to be discharged and was very anxious about it. Discussed the plan with patient and his wife and they were comfortable to get the Myoview as outpatient as he is completely asymptomatic at this time.  -Started on aspirin at discharge and also give prescription for when necessary sublingual nitroglycerin  #2 chronic low back pain-status post steroid injection in the lower back prior to admission. -Continue when necessary tramadol  #3 ulcerative colitis-stable. Continue mesalamine  #4 BPH-on Proscar.  Discharge home today  DISCHARGE CONDITIONS:   Guarded  CONSULTS OBTAINED:   Treatment Team:  Thompson Grayer, MD  DRUG ALLERGIES:   No  Known Allergies DISCHARGE MEDICATIONS:   Allergies as of 09/06/2016   No Known Allergies     Medication List    TAKE these medications   aspirin 81 MG chewable tablet Chew 1 tablet (81 mg total) by mouth daily.   finasteride 5 MG tablet Commonly known as:  PROSCAR Take 5 mg by mouth every other day. Notes to patient:  EVERY OTHER DAY   mesalamine 1.2 g EC tablet Commonly known as:  LIALDA Take 3 tablets by mouth daily.   nitroGLYCERIN 0.4 MG SL tablet Commonly known as:  NITROSTAT Place 1 tablet (0.4 mg total) under the tongue every 5 (five) minutes as needed for chest pain (x3).   pantoprazole 40 MG tablet Commonly known as:  PROTONIX Take 40 mg by mouth every other day.   traMADol 50 MG tablet Commonly known as:  ULTRAM Take 50 mg by mouth every 6 (six) hours as needed for moderate pain.        DISCHARGE INSTRUCTIONS:   1. PCP follow-up in 1 week. Outpatient stress test recommended.  DIET:   Cardiac diet  ACTIVITY:   Activity as tolerated  OXYGEN:   Home Oxygen: No.  Oxygen Delivery: room air  DISCHARGE LOCATION:   home   If you experience worsening of your admission symptoms, develop shortness of breath, life threatening emergency, suicidal or homicidal thoughts you must seek medical attention immediately by calling 911 or calling your MD immediately  if symptoms less severe.  You Must read complete instructions/literature along with all the possible adverse reactions/side effects for all the Medicines you take and that  have been prescribed to you. Take any new Medicines after you have completely understood and accpet all the possible adverse reactions/side effects.   Please note  You were cared for by a hospitalist during your hospital stay. If you have any questions about your discharge medications or the care you received while you were in the hospital after you are discharged, you can call the unit and asked to speak with the hospitalist on call  if the hospitalist that took care of you is not available. Once you are discharged, your primary care physician will handle any further medical issues. Please note that NO REFILLS for any discharge medications will be authorized once you are discharged, as it is imperative that you return to your primary care physician (or establish a relationship with a primary care physician if you do not have one) for your aftercare needs so that they can reassess your need for medications and monitor your lab values.    On the day of Discharge:  VITAL SIGNS:   Blood pressure 133/78, pulse 61, temperature 98.1 F (36.7 C), temperature source Oral, resp. rate 16, height 5' 10"  (1.778 m), weight 93.7 kg (206 lb 9.6 oz), SpO2 94 %.  PHYSICAL EXAMINATION:    GENERAL:  61 y.o.-year-old patient lying in the bed with no acute distress.  EYES: Pupils equal, round, reactive to light and accommodation. No scleral icterus. Extraocular muscles intact.  HEENT: Head atraumatic, normocephalic. Oropharynx and nasopharynx clear.  NECK:  Supple, no jugular venous distention. No thyroid enlargement, no tenderness.  LUNGS: Normal breath sounds bilaterally, no wheezing, rales,rhonchi or crepitation. No use of accessory muscles of respiration.  CARDIOVASCULAR: S1, S2 normal. No murmurs, rubs, or gallops.  ABDOMEN: Soft, nontender, nondistended. Bowel sounds present. No organomegaly or mass.  EXTREMITIES: No pedal edema, cyanosis, or clubbing.  NEUROLOGIC: Cranial nerves II through XII are intact. Muscle strength 5/5 in all extremities. Sensation intact. Gait not checked.  PSYCHIATRIC: The patient is alert and oriented x 3.  SKIN: No obvious rash, lesion, or ulcer.   DATA REVIEW:   CBC  Recent Labs Lab 09/06/16 0202  WBC 9.7  HGB 14.1  HCT 40.7  PLT 306    Chemistries   Recent Labs Lab 09/05/16 2255 09/06/16 0202  NA 136 138  K 4.1 5.1  CL 104 103  CO2 24 28  GLUCOSE 157* 143*  BUN 16 17  CREATININE  0.67 0.73  CALCIUM 9.3 9.3  AST 27  --   ALT 15*  --   ALKPHOS 62  --   BILITOT 0.5  --      Microbiology Results  No results found for this or any previous visit.  RADIOLOGY:  No results found.   Management plans discussed with the patient, family and they are in agreement.  CODE STATUS:  Code Status History    Date Active Date Inactive Code Status Order ID Comments User Context   09/06/2016  1:39 AM 09/06/2016  5:08 PM Full Code [id number]  Lance Coon, MD ED      TOTAL TIME TAKING CARE OF THIS PATIENT: 38 minutes.    Mart Colpitts M.D on 09/07/2016 at 12:19 PM  Between 7am to 6pm - Pager - [phone]  After 6pm go to [email or web address] - Proofreader  [facility]  Office  [phone]  CC: Primary care physician; [name], MD   Note: This dictation was prepared with Dragon dictation along with smaller phrase technology. Any transcriptional errors that result  from this process are unintentional.

## 2016-09-08 ENCOUNTER — Other Ambulatory Visit: Payer: No Typology Code available for payment source

## 2016-09-09 ENCOUNTER — Encounter
Admission: RE | Disposition: A | Discharge status: Home / Self Care | Payer: Self-pay | Source: Ambulatory Visit | Attending: Internal Medicine

## 2016-09-09 ENCOUNTER — Ambulatory Visit: Admit: 2016-09-09 | Payer: No Typology Code available for payment source | Admitting: Internal Medicine

## 2016-09-09 ENCOUNTER — Ambulatory Visit
Admission: RE | Admit: 2016-09-09 | Discharge: 2016-09-09 | Disposition: A | Discharge status: Home / Self Care | Payer: No Typology Code available for payment source | Source: Ambulatory Visit | Attending: Internal Medicine | Admitting: Internal Medicine

## 2016-09-09 DIAGNOSIS — Z955 Presence of coronary angioplasty implant and graft: Secondary | ICD-10-CM | POA: Insufficient documentation

## 2016-09-09 DIAGNOSIS — K5190 Ulcerative colitis, unspecified, without complications: Secondary | ICD-10-CM | POA: Insufficient documentation

## 2016-09-09 DIAGNOSIS — Z6829 Body mass index (BMI) 29.0-29.9, adult: Secondary | ICD-10-CM | POA: Insufficient documentation

## 2016-09-09 DIAGNOSIS — E669 Obesity, unspecified: Secondary | ICD-10-CM | POA: Diagnosis not present

## 2016-09-09 DIAGNOSIS — M1990 Unspecified osteoarthritis, unspecified site: Secondary | ICD-10-CM | POA: Diagnosis not present

## 2016-09-09 DIAGNOSIS — Z8249 Family history of ischemic heart disease and other diseases of the circulatory system: Secondary | ICD-10-CM | POA: Diagnosis not present

## 2016-09-09 DIAGNOSIS — Z720 Tobacco use: Secondary | ICD-10-CM | POA: Diagnosis not present

## 2016-09-09 DIAGNOSIS — I2510 Atherosclerotic heart disease of native coronary artery without angina pectoris: Secondary | ICD-10-CM | POA: Diagnosis present

## 2016-09-09 DIAGNOSIS — Z86718 Personal history of other venous thrombosis and embolism: Secondary | ICD-10-CM | POA: Diagnosis not present

## 2016-09-09 DIAGNOSIS — I25110 Atherosclerotic heart disease of native coronary artery with unstable angina pectoris: Secondary | ICD-10-CM | POA: Diagnosis not present

## 2016-09-09 DIAGNOSIS — E785 Hyperlipidemia, unspecified: Secondary | ICD-10-CM | POA: Diagnosis not present

## 2016-09-09 HISTORY — PX: LEFT HEART CATH AND CORONARY ANGIOGRAPHY: CATH118249

## 2016-09-09 SURGERY — LEFT HEART CATH AND CORONARY ANGIOGRAPHY
Anesthesia: Moderate Sedation

## 2016-09-09 SURGERY — LEFT HEART CATH AND CORONARY ANGIOGRAPHY
Anesthesia: Moderate Sedation | Laterality: Left

## 2016-09-09 MED ORDER — IOPAMIDOL (ISOVUE-300) INJECTION 61%
INTRAVENOUS | Status: DC | PRN
  Administered 2016-09-09: 105 mL via INTRA_ARTERIAL

## 2016-09-09 MED ORDER — MIDAZOLAM HCL 2 MG/2ML IJ SOLN
INTRAMUSCULAR | Status: AC
  Filled 2016-09-09: qty 2

## 2016-09-09 MED ORDER — SODIUM CHLORIDE 0.9 % WEIGHT BASED INFUSION
3.0000 mL/kg/h | INTRAVENOUS | Status: DC
  Administered 2016-09-09: 3 mL/kg/h via INTRAVENOUS

## 2016-09-09 MED ORDER — ASPIRIN 81 MG PO CHEW: 81.0000 mg | CHEWABLE_TABLET | ORAL | Status: DC

## 2016-09-09 MED ORDER — SODIUM CHLORIDE 0.9 % IV SOLN: 250.0000 mL | INTRAVENOUS | Status: DC | PRN

## 2016-09-09 MED ORDER — SODIUM CHLORIDE 0.9% FLUSH: 3.0000 mL | INTRAVENOUS | Status: DC | PRN

## 2016-09-09 MED ORDER — FENTANYL CITRATE (PF) 100 MCG/2ML IJ SOLN
INTRAMUSCULAR | Status: AC
  Filled 2016-09-09: qty 2

## 2016-09-09 MED ORDER — MIDAZOLAM HCL 2 MG/2ML IJ SOLN
INTRAMUSCULAR | Status: DC | PRN
  Administered 2016-09-09: 1 mg via INTRAVENOUS

## 2016-09-09 MED ORDER — SODIUM CHLORIDE 0.9% FLUSH: 3.0000 mL | Freq: Two times a day (BID) | INTRAVENOUS | Status: DC

## 2016-09-09 MED ORDER — SODIUM CHLORIDE 0.9 % WEIGHT BASED INFUSION
1.0000 mL/kg/h | INTRAVENOUS | Status: DC
  Administered 2016-09-09: 1 mL/kg/h via INTRAVENOUS

## 2016-09-09 MED ORDER — SODIUM CHLORIDE 0.9 % WEIGHT BASED INFUSION: 1.0000 mL/kg/h | INTRAVENOUS | Status: DC

## 2016-09-09 MED ORDER — ACETAMINOPHEN 325 MG PO TABS: 650.0000 mg | ORAL_TABLET | ORAL | Status: DC | PRN

## 2016-09-09 MED ORDER — ONDANSETRON HCL 4 MG/2ML IJ SOLN: 4.0000 mg | Freq: Four times a day (QID) | INTRAMUSCULAR | Status: DC | PRN

## 2016-09-09 MED ORDER — HEPARIN (PORCINE) IN NACL 2-0.9 UNIT/ML-% IJ SOLN
INTRAMUSCULAR | Status: AC
  Filled 2016-09-09: qty 500

## 2016-09-09 MED ORDER — FENTANYL CITRATE (PF) 100 MCG/2ML IJ SOLN
INTRAMUSCULAR | Status: DC | PRN
  Administered 2016-09-09: 50 ug via INTRAVENOUS

## 2016-09-09 SURGICAL SUPPLY — 9 items
CATH 5FR PIGTAIL DIAGNOSTIC (CATHETERS) ×2 IMPLANT
CATH INFINITI 5FR JL4 (CATHETERS) ×1 IMPLANT
CATH INFINITI JR4 5F (CATHETERS) ×2 IMPLANT
DEVICE CLOSURE MYNXGRIP 5F (Vascular Products) ×1 IMPLANT
GUIDEWIRE 3MM J TIP .035 145 (WIRE) ×2 IMPLANT
KIT MANI 3VAL PERCEP (MISCELLANEOUS) ×2 IMPLANT
NEEDLE PERC 18GX7CM (NEEDLE) ×2 IMPLANT
PACK CARDIAC CATH (CUSTOM PROCEDURE TRAY) ×2 IMPLANT
SHEATH AVANTI 5FR X 11CM (SHEATH) ×1 IMPLANT

## 2016-09-09 NOTE — Discharge Instructions (Signed)
Moderate Conscious Sedation, Adult, Care After These instructions provide you with information about caring for yourself after your procedure. Your health care provider may also give you more specific instructions. Your treatment has been planned according to current medical practices, but problems sometimes occur. Call your health care provider if you have any problems or questions after your procedure. What can I expect after the procedure? After your procedure, it is common:  To feel sleepy for several hours.  To feel clumsy and have poor balance for several hours.  To have poor judgment for several hours.  To vomit if you eat too soon. Follow these instructions at home: For at least 24 hours after the procedure:    Do not:  Participate in activities where you could fall or become injured.  Drive.  Use heavy machinery.  Drink alcohol.  Take sleeping pills or medicines that cause drowsiness.  Make important decisions or sign legal documents.  Take care of children on your own.  Rest. Eating and drinking   Follow the diet recommended by your health care provider.  If you vomit:  Drink water, juice, or soup when you can drink without vomiting.  Make sure you have little or no nausea before eating solid foods. General instructions   Have a responsible adult stay with you until you are awake and alert.  Take over-the-counter and prescription medicines only as told by your health care provider.  If you smoke, do not smoke without supervision.  Keep all follow-up visits as told by your health care provider. This is important. Contact a health care provider if:  You keep feeling nauseous or you keep vomiting.  You feel light-headed.  You develop a rash.  You have a fever. Get help right away if:  You have trouble breathing. This information is not intended to replace advice given to you by your health care provider. Make sure you discuss any questions you  have with your health care provider. Document Released: 01/26/2013 Document Revised: 09/10/2015 Document Reviewed: 07/28/2015 [employer] Interactive Patient Education  2017 [place]. Groin Insertion Instructions-If you lose feeling or develop tingling or pain in your leg or foot after the procedure, please walk around first.  If the discomfort does not improve , contact your physician and proceed to the nearest emergency room.  Loss of feeling in your leg might mean that a blockage has formed in the artery and this can be appropriately treated.  Limit your activity for the next two days after your procedure.  Avoid stooping, bending, heavy lifting or exertion as this may put pressure on the insertion site.  Resume normal activities in 48 hours.  You may shower after 24 hours but avoid excessive warm water and do not scrub the site.  Remove clear dressing in 48 hours.  If you have had a closure device inserted, do not soak in a tub bath or a hot tub for at least one week.  No driving for 48 hours after discharge.  After the procedure, check the insertion site occasionally.  If any oozing occurs or there is apparent swelling, firm pressure over the site will prevent a bruise from forming.  You can not hurt anything by pressing directly on the site.  The pressure stops the bleeding by allowing a small clot to form.  If the bleeding continues after the pressure has been applied for more than 15 minutes, call 911 or go to the nearest emergency room.    The x-ray dye causes  you to pass a considerate amount of urine.  For this reason, you will be asked to drink plenty of liquids after the procedure to prevent dehydration.  You may resume you regular diet.  Avoid caffeine products.    For pain at the site of your procedure, take non-aspirin medicines such as Tylenol.  Medications: A. Hold Metformin for 48 hours if applicable.  B. Continue taking all your present medications at home unless your doctor  prescribes any changes.

## 2016-09-10 ENCOUNTER — Encounter: Payer: Self-pay | Admitting: Internal Medicine

## 2016-09-10 LAB — CARDIAC CATHETERIZATION: CATHEFQUANT: 60 %

## 2016-09-17 ENCOUNTER — Other Ambulatory Visit: Payer: Self-pay | Admitting: Gastroenterology

## 2016-09-17 DIAGNOSIS — R1013 Epigastric pain: Secondary | ICD-10-CM

## 2016-09-17 DIAGNOSIS — R110 Nausea: Secondary | ICD-10-CM

## 2016-10-14 ENCOUNTER — Ambulatory Visit: Admission: RE | Admit: 2016-10-14 | Payer: No Typology Code available for payment source | Source: Ambulatory Visit

## 2017-06-24 ENCOUNTER — Ambulatory Visit
Admission: RE | Admit: 2017-06-24 | Discharge: 2017-06-24 | Disposition: A | Discharge status: Home / Self Care | Payer: 59 | Source: Ambulatory Visit | Attending: Nurse Practitioner | Admitting: Nurse Practitioner

## 2017-06-24 ENCOUNTER — Encounter: Payer: Self-pay | Admitting: Nurse Practitioner

## 2017-06-24 ENCOUNTER — Ambulatory Visit: Payer: 59 | Attending: Nurse Practitioner | Admitting: Nurse Practitioner

## 2017-06-24 VITALS — BP 137/94 | HR 91 | Temp 98.0°F | Resp 16 | Ht 69.0 in | Wt 210.0 lb

## 2017-06-24 DIAGNOSIS — Z9660 Presence of unspecified orthopedic joint implant: Secondary | ICD-10-CM | POA: Insufficient documentation

## 2017-06-24 DIAGNOSIS — Z79899 Other long term (current) drug therapy: Secondary | ICD-10-CM | POA: Insufficient documentation

## 2017-06-24 DIAGNOSIS — M545 Low back pain, unspecified: Secondary | ICD-10-CM

## 2017-06-24 DIAGNOSIS — G8929 Other chronic pain: Secondary | ICD-10-CM | POA: Insufficient documentation

## 2017-06-24 DIAGNOSIS — M47812 Spondylosis without myelopathy or radiculopathy, cervical region: Secondary | ICD-10-CM | POA: Diagnosis not present

## 2017-06-24 DIAGNOSIS — Z789 Other specified health status: Secondary | ICD-10-CM

## 2017-06-24 DIAGNOSIS — M5481 Occipital neuralgia: Secondary | ICD-10-CM | POA: Diagnosis present

## 2017-06-24 DIAGNOSIS — M542 Cervicalgia: Secondary | ICD-10-CM | POA: Diagnosis not present

## 2017-06-24 DIAGNOSIS — Z79891 Long term (current) use of opiate analgesic: Secondary | ICD-10-CM | POA: Insufficient documentation

## 2017-06-24 DIAGNOSIS — G894 Chronic pain syndrome: Secondary | ICD-10-CM | POA: Insufficient documentation

## 2017-06-24 DIAGNOSIS — Z813 Family history of other psychoactive substance abuse and dependence: Secondary | ICD-10-CM | POA: Insufficient documentation

## 2017-06-24 DIAGNOSIS — Z8249 Family history of ischemic heart disease and other diseases of the circulatory system: Secondary | ICD-10-CM | POA: Insufficient documentation

## 2017-06-24 DIAGNOSIS — M50321 Other cervical disc degeneration at C4-C5 level: Secondary | ICD-10-CM | POA: Insufficient documentation

## 2017-06-24 DIAGNOSIS — Z8379 Family history of other diseases of the digestive system: Secondary | ICD-10-CM | POA: Diagnosis not present

## 2017-06-24 DIAGNOSIS — M899 Disorder of bone, unspecified: Secondary | ICD-10-CM | POA: Diagnosis not present

## 2017-06-24 DIAGNOSIS — M25512 Pain in left shoulder: Secondary | ICD-10-CM

## 2017-06-24 DIAGNOSIS — M25562 Pain in left knee: Secondary | ICD-10-CM | POA: Diagnosis not present

## 2017-06-24 DIAGNOSIS — Z833 Family history of diabetes mellitus: Secondary | ICD-10-CM | POA: Diagnosis not present

## 2017-06-24 DIAGNOSIS — Z809 Family history of malignant neoplasm, unspecified: Secondary | ICD-10-CM | POA: Insufficient documentation

## 2017-06-24 DIAGNOSIS — M25561 Pain in right knee: Secondary | ICD-10-CM

## 2017-06-24 DIAGNOSIS — Z955 Presence of coronary angioplasty implant and graft: Secondary | ICD-10-CM | POA: Insufficient documentation

## 2017-06-24 DIAGNOSIS — M79622 Pain in left upper arm: Secondary | ICD-10-CM | POA: Insufficient documentation

## 2017-06-24 DIAGNOSIS — M898X2 Other specified disorders of bone, upper arm: Secondary | ICD-10-CM

## 2017-06-24 NOTE — Progress Notes (Signed)
Patient's Name: Samuel Roberts  MRN: [id number]  Referring Provider: Renata Caprice  DOB: [birth year]  PCP: [name], MD  DOS: 06/24/2017  Note by: Dionisio David NP  Service setting: Ambulatory outpatient  Specialty: Interventional Pain Management  Location: [facility] (AMB) Pain Management Facility    Patient type: New Patient    Primary Reason(s) for Visit: Initial Patient Evaluation CC: Neck Pain (left s/p surgery from a fall); Shoulder Pain (left); Back Pain (right lower s/p fracture); Knee Pain (bilateral.  right knee s/p orthoscopy x 2.  pins in right knee ); and Foot Pain (bilateral)  HPI  Samuel Roberts is a 62 y.o. year old, male patient, who comes today for an initial evaluation. He has Angina pectoris ([place]); UC (ulcerative colitis) ([place]); BPH (benign prostatic hyperplasia); Carcinoma in situ of prostate; Elevated prostate specific antigen (PSA); Family history of malignant neoplasm of prostate; Osteoarthritis of knee; Enlarged prostate with lower urinary tract symptoms (LUTS); Chronic neck pain  (Primary Area of Pain) (B (L>R); Occipital neuralgia of left side [facility] Area of Pain); Chronic left shoulder pain (Secondary Area of Pain); Chronic right-sided low back pain without sciatica (Fourth Area of Pain); Chronic pain of both knees (R>L); Chronic pain syndrome; Long term current use of opiate analgesic; Pharmacologic therapy; Disorder of skeletal system; and Problems influencing health status on their problem list.. His primarily concern today is the Neck Pain (left s/p surgery from a fall); Shoulder Pain (left); Back Pain (right lower s/p fracture); Knee Pain (bilateral.  right knee s/p orthoscopy x 2.  pins in right knee ); and Foot Pain (bilateral)  Pain Assessment: Location: Lower, Right(has been told that it was his SI joint) Back(see visit information) Radiating: neck into shoulder and upper back in the spinal area Onset: More than a month ago Duration: Chronic pain Quality:  Sore, Discomfort, Dull, Constant, Sharp, Radiating Severity: 4 /10 (self-reported pain score)  Note: Reported level is compatible with observation.                          Effect on ADL: patient states he remains very active, working fulltime.  neck and shoulder pain affects him more than anything along with the back.  Timing: Constant Modifying factors: procedures to the knee,   Onset and Duration: Present longer than 3 months Cause of pain: football injuries Severity: Getting worse, NAS-11 at its worse: 10/10, NAS-11 at its best: 4/10, NAS-11 now: 5/10 and NAS-11 on the average: 7/10 Timing: Not influenced by the time of the day Aggravating Factors: Bending, Climbing, Kneeling, Lifiting, Motion, Prolonged sitting, Prolonged standing, Squatting, Stooping , Twisting, Walking, Walking uphill, Walking downhill and Working Alleviating Factors: none Associated Problems: Fatigue, Nausea, Personality changes, Spasms, Swelling, Pain that wakes patient up and Pain that does not allow patient to sleep Quality of Pain: Aching, Agonizing, Annoying, Burning, Constant, Deep, Distressing, Exhausting, Feeling of constriction, Heavy, Pressure-like, Sharp, Shooting, Tender, Uncomfortable and Work related Previous Examinations or Tests: Endoscopy, X-rays and Chiropractic evaluation Previous Treatments: Chiropractic manipulations  The patient comes into the clinics today for the first time for a chronic pain management evaluation. According to the patient's primary area of pain is in his neck. He admits that 6 years ago he fell off a loading dock 6 feet and fracture his left humerus.  He admits that he has been having left sided neck and shoulder pain since the fall. He denies any previous surgeries, interventional therapy, physical therapy or  recent images.   His second area of pain is in his left shoulder. He denies any surgery just casting after the fall.  He is left-hand dominant. He denies any upper extremity  numbness tingling or weakness. He denies any previous physical therapy or recent images.  His third area of pain is headaches. He admits that he does have a headache most days. He admits that the majority are in the back of his head. He contributes that to daily working. He denies any previous interventional therapy or specific treatment.  His fourth area pain is in his lower back. He admits that his right side only. He denies any pain going down the legs.He denies any previous surgery. He admits that he has had sacroiliac joint injection by Dr. Lubertha Sayres greater than 1 year ago. He admits that he has been more than a chiropractor for adjustments and has been effective. He denies any recent physical therapy. He admits that he did have a recent x-ray.  His next area of pain is in his knees. He admits that the right is greater than the left. He is status post surgery to his right knee. He states first surgery was in 1980 and he had 2 additional arthroscopic surgeries by Dr. Sabra Heck with [facility]. He admits that he did have a cortisone injection to the right knee recently which is effective. He admits that he has had injection to his left knee within the past year which was also effective. He has had recent images   Today I took the time to provide the patient with information regarding this pain practice. The patient was informed that the practice is divided into two sections: an interventional pain management section, as well as a completely separate and distinct medication management section. I explained that there are procedure days for interventional therapies, and evaluation days for follow-ups and medication management. Because of the amount of documentation required during both, they are kept separated. This means that there is the possibility that he may be scheduled for a procedure on one day, and medication management the next. I have also informed him that because of staffing and facility  limitations, this practice will no longer take patients for medication management only. To illustrate the reasons for this, I gave the patient the example of surgeons, and how inappropriate it would be to refer a patient to his/her care, just to write for the post-surgical antibiotics on a surgery done by a different surgeon.   Because interventional pain management is part of the board-certified specialty for the doctors, the patient was informed that joining this practice means that they are open to any and all interventional therapies. I made it clear that this does not mean that they will be forced to have any procedures done. What this means is that I believe interventional therapies to be essential part of the diagnosis and proper management of chronic pain conditions. Therefore, patients not interested in these interventional alternatives will be better served under the care of a different practitioner.  The patient was also made aware of my Comprehensive Pain Management Safety Guidelines where by joining this practice, they limit all of their nerve blocks and joint injections to those done by our practice, for as long as we are retained to manage their care. Historic Controlled Substance Pharmacotherapy Review  PMP and historical list of controlled substances: Tramadol 50 mg, oxycodone/acetaminophen 10/325 mg, diphenoxylate/atropine 2.5/0.025, hydrocodone/acetaminophen 5/325 mg, oxycodone 5 mg, oxycodone/acetaminophen 5/325 mg Highest opioid analgesic regimen found:  Oxycodone/acetaminophen 10 /325 mg 1-2 tablets every 2 hours (fill date 07/11/2016) oxycodone 60 mg per day Most recent opioid analgesic: Tramadol 50 mg 4 times daily (fill date 06/12/2017) tramadol 20 mg per day Current opioid analgesics: Tramadol 50 mg 4 times daily (fill date 06/12/2017) tramadol 20 mg per day however patient only using 10 mg per day Highest recorded MME/day: 42m/day MME/day: 10 mg/day Medications: The patient did  not bring the medication(s) to the appointment, as requested in our "New Patient Package" Pharmacodynamics: Desired effects: Analgesia: The patient reports >50% benefit. Reported improvement in function: The patient reports medication allows him to accomplish basic ADLs. Clinically meaningful improvement in function (CMIF): Sustained CMIF goals met Perceived effectiveness: Described as relatively effective, allowing for increase in activities of daily living (ADL) Undesirable effects: Side-effects or Adverse reactions: None reported Historical Monitoring: The patient  reports that he does not use drugs. List of all UDS Test(s): No results found for: MDMA, COCAINSCRNUR, PCPSCRNUR, PCPQUANT, CANNABQUANT, THCU, EJasperList of all Serum Drug Screening Test(s):  No results found for: AMPHSCRSER, BARBSCRSER, BENZOSCRSER, COCAINSCRSER, PCPSCRSER, PCPQUANT, THCSCRSER, CANNABQUANT, OPIATESCRSER, OXYSCRSER, PROPOXSCRSER Historical Background Evaluation: [place] PDMP: Six (6) year initial data search conducted.             [place] Department of public safety, offender search: (Editor, commissioningInformation) Non-contributory Risk Assessment Profile: Aberrant behavior: None observed or detected today Risk factors for fatal opioid overdose: None identified today Fatal overdose hazard ratio (HR): Calculation deferred Non-fatal overdose hazard ratio (HR): Calculation deferred Risk of opioid abuse or dependence: 0.7-3.0% with doses ? 36 MME/day and 6.1-26% with doses ? 120 MME/day. Substance use disorder (SUD) risk level: Pending results of Medical Psychology Evaluation for SUD Opioid risk tool (ORT) (Total Score): 10  ORT Scoring interpretation table:  Score <3 = Low Risk for SUD  Score between 4-7 = Moderate Risk for SUD  Score >8 = High Risk for Opioid Abuse   PHQ-2 Depression Scale:  Total score: 0  PHQ-2 Scoring interpretation table: (Score and probability of major depressive disorder)  Score 0 = No depression   Score 1 = 15.4% Probability  Score 2 = 21.1% Probability  Score 3 = 38.4% Probability  Score 4 = 45.5% Probability  Score 5 = 56.4% Probability  Score 6 = 78.6% Probability   PHQ-9 Depression Scale:  Total score: 0  PHQ-9 Scoring interpretation table:  Score 0-4 = No depression  Score 5-9 = Mild depression  Score 10-14 = Moderate depression  Score 15-19 = Moderately severe depression  Score 20-27 = Severe depression (2.4 times higher risk of SUD and 2.89 times higher risk of overuse)   Pharmacologic Plan: Pending ordered tests and/or consults  Meds  The patient has a current medication list which includes the following prescription(s): metoprolol tartrate, pantoprazole, and tramadol.  Current Outpatient Medications on File Prior to Visit  Medication Sig  . metoprolol tartrate (LOPRESSOR) 25 MG tablet Take 25 mg by mouth 2 (two) times daily.  . pantoprazole (PROTONIX) 40 MG tablet Take 40 mg by mouth every other day.   . traMADol (ULTRAM) 50 MG tablet Take 50 mg by mouth every 6 (six) hours as needed for moderate pain.    No current facility-administered medications on file prior to visit.    Imaging Review    Note: No new results found.        ROS  Cardiovascular History: No reported cardiovascular signs or symptoms such as High blood pressure, coronary artery disease, abnormal heart  rate or rhythm, heart attack, blood thinner therapy or heart weakness and/or failure Pulmonary or Respiratory History: Shortness of breath Neurological History: No reported neurological signs or symptoms such as seizures, abnormal skin sensations, urinary and/or fecal incontinence, being born with an abnormal open spine and/or a tethered spinal cord Review of Past Neurological Studies: No results found for this or any previous visit. Psychological-Psychiatric History: No reported psychological or psychiatric signs or symptoms such as difficulty sleeping, anxiety, depression, delusions or  hallucinations (schizophrenial), mood swings (bipolar disorders) or suicidal ideations or attempts Gastrointestinal History: Reflux or heatburn Genitourinary History: No reported renal or genitourinary signs or symptoms such as difficulty voiding or producing urine, peeing blood, non-functioning kidney, kidney stones, difficulty emptying the bladder, difficulty controlling the flow of urine, or chronic kidney disease Hematological History: No reported hematological signs or symptoms such as prolonged bleeding, low or poor functioning platelets, bruising or bleeding easily, hereditary bleeding problems, low energy levels due to low hemoglobin or being anemic Endocrine History: No reported endocrine signs or symptoms such as high or low blood sugar, rapid heart rate due to high thyroid levels, obesity or weight gain due to slow thyroid or thyroid disease Rheumatologic History: No reported rheumatological signs and symptoms such as fatigue, joint pain, tenderness, swelling, redness, heat, stiffness, decreased range of motion, with or without associated rash Musculoskeletal History: Negative for myasthenia gravis, muscular dystrophy, multiple sclerosis or malignant hyperthermia Work History: Working full time  Allergies  Samuel Roberts has No Known Allergies.  Laboratory Chemistry  Inflammation Markers No results found for: CRP, ESRSEDRATE (CRP: Acute Phase) (ESR: Chronic Phase) Renal Function Markers Lab Results  Component Value Date   BUN 17 09/06/2016   CREATININE 0.73 09/06/2016   GFRAA >60 09/06/2016   GFRNONAA >60 09/06/2016   Hepatic Function Markers Lab Results  Component Value Date   AST 27 09/05/2016   ALT 15 (L) 09/05/2016   ALBUMIN 4.1 09/05/2016   ALKPHOS 62 09/05/2016   Electrolytes Lab Results  Component Value Date   NA 138 09/06/2016   K 5.1 09/06/2016   CL 103 09/06/2016   CALCIUM 9.3 09/06/2016   Neuropathy Markers No results found for: [id number] Bone Pathology  Markers Lab Results  Component Value Date   ALKPHOS 62 09/05/2016   CALCIUM 9.3 09/06/2016   Coagulation Parameters Lab Results  Component Value Date   PLT 306 09/06/2016   Cardiovascular Markers Lab Results  Component Value Date   HGB 14.1 09/06/2016   HCT 40.7 09/06/2016   Note: Lab results reviewed.  [place]  Drug: Samuel Roberts  reports that he does not use drugs. Alcohol:  reports that he does not drink alcohol. Tobacco:  reports that  has never smoked. he has never used smokeless tobacco. Medical:  has a past medical history of BPH (benign prostatic hyperplasia) and Colitis, ulcerative ([place]). Family: family history includes Cancer in his father; Cirrhosis in his brother; Diabetes in his mother, sister, and sister; Drug abuse in his brother and brother; Heart disease in his brother and brother; Heart failure in his father and mother.  Past Surgical History:  Procedure Laterality Date  . JOINT REPLACEMENT Right    pins and tendon replacement.   [facility] Kitchen KNEE ARTHROSCOPY AND ARTHROTOMY    . LEFT HEART CATH AND CORONARY ANGIOGRAPHY Left 09/09/2016   Procedure: Left Heart Cath and Coronary Angiography;  Surgeon: Yolonda Kida, MD;  Location: [facility];  Service: Cardiovascular;  Laterality: Left;  . TONSILLECTOMY  Active Ambulatory Problems    Diagnosis Date Noted  . Angina pectoris ([place]) 09/06/2016  . UC (ulcerative colitis) ([place]) 09/06/2016  . BPH (benign prostatic hyperplasia) 09/06/2016  . Carcinoma in situ of prostate 03/08/2012  . Elevated prostate specific antigen (PSA) 03/08/2012  . Family history of malignant neoplasm of prostate 03/08/2012  . Osteoarthritis of knee 01/02/2015  . Enlarged prostate with lower urinary tract symptoms (LUTS) 03/08/2012  . Chronic neck pain  (Primary Area of Pain) (B (L>R) 06/24/2017  . Occipital neuralgia of left side [facility] Area of Pain) 06/24/2017  . Chronic left shoulder pain (Secondary Area of Pain) 06/24/2017  .  Chronic right-sided low back pain without sciatica (Fourth Area of Pain) 06/24/2017  . Chronic pain of both knees (R>L) 06/24/2017  . Chronic pain syndrome 06/24/2017  . Long term current use of opiate analgesic 06/24/2017  . Pharmacologic therapy 06/24/2017  . Disorder of skeletal system 06/24/2017  . Problems influencing health status 06/24/2017   Resolved Ambulatory Problems    Diagnosis Date Noted  . No Resolved Ambulatory Problems   Past Medical History:  Diagnosis Date  . BPH (benign prostatic hyperplasia)   . Colitis, ulcerative ([place])    Constitutional Exam  General appearance: Well nourished, well developed, and well hydrated. In no apparent acute distress Vitals:   06/24/17 0814  BP: (!) 137/94  Pulse: 91  Resp: 16  Temp: 98 F (36.7 C)  TempSrc: Oral  SpO2: 98%  Weight: 210 lb (95.3 kg)  Height: _0  (1.753 m)   BMI Assessment: Estimated body mass index is 31.01 kg/m as calculated from the following:   Height as of this encounter: _1  (1.753 m).   Weight as of this encounter: 210 lb (95.3 kg).  BMI interpretation table: BMI level Category Range association with higher incidence of chronic pain  <18 kg/m2 Underweight   18.5-24.9 kg/m2 Ideal body weight   25-29.9 kg/m2 Overweight Increased incidence by 20%  30-34.9 kg/m2 Obese (Class I) Increased incidence by 68%  35-39.9 kg/m2 Severe obesity (Class II) Increased incidence by 136%  >40 kg/m2 Extreme obesity (Class III) Increased incidence by 254%   BMI Readings from Last 4 Encounters:  06/24/17 31.01 kg/m  09/09/16 29.56 kg/m  09/06/16 29.64 kg/m  12/09/15 27.26 kg/m   Wt Readings from Last 4 Encounters:  06/24/17 210 lb (95.3 kg)  09/09/16 206 lb (93.4 kg)  09/06/16 206 lb 9.6 oz (93.7 kg)  12/09/15 190 lb (86.2 kg)  Psych/Mental status: Alert, oriented x 3 (person, place, & time)       Eyes: PERLA Respiratory: No evidence of acute respiratory distress  Cervical Spine Exam  Inspection: No  masses, redness, or swelling Alignment: Symmetrical Functional ROM: Adequate ROM      Stability: No instability detected Muscle strength & Tone: Functionally intact Sensory: Unimpaired Palpation: Complains of area being tender to palpation              Upper Extremity (UE) Exam    Side: Right upper extremity  Side: Left upper extremity  Inspection: No masses, redness, swelling, or asymmetry. No contractures  Inspection: Asymmetry in comparison to right  Functional ROM: Unrestricted ROM          Functional ROM: Unrestricted ROM          Muscle strength & Tone: Functionally intact  Muscle strength & Tone: Functionally intact  Sensory: Unimpaired  Sensory: Unimpaired  Palpation: No palpable anomalies  Palpation: Complains of area being tender to palpation              Specialized Test(s): Deferred         Specialized Test(s): Deferred          Thoracic Spine Exam  Inspection: No masses, redness, or swelling Alignment: Symmetrical Functional ROM: Unrestricted ROM Stability: No instability detected Sensory: Unimpaired Muscle strength & Tone: No palpable anomalies  Lumbar Spine Exam  Inspection: No masses, redness, or swelling Alignment: Symmetrical Functional ROM: Painful ROM      Stability: No instability detected Muscle strength & Tone: Functionally intact Sensory: Unimpaired Palpation: Non-tender       Provocative Tests: Lumbar Hyperextension and rotation test: Positive on the right for facet joint pain.bilateral leg raising is negative Patrick's Maneuver: Negative for bilateral S-I arthralgia and for bilateral hip arthralgia  Gait & Posture Assessment  Ambulation: Unassisted Gait: Relatively normal for age and body habitus Posture: WNL   Lower Extremity Exam    Side: Right lower extremity  Side: Left lower extremity  Inspection: Well-healed scar from previous surgery Edema  Inspection: No masses, redness, swelling, or asymmetry. No contractures  Functional ROM:  Decreased ROM          Functional ROM: Unrestricted ROM          Muscle strength & Tone: Functionally intact  Muscle strength & Tone: Functionally intact  Sensory: Unimpaired  Sensory: Unimpaired  Palpation: Tender medial aspect   Palpation: No palpable anomalies   Assessment  Primary Diagnosis & Pertinent Problem List: The primary encounter diagnosis was Chronic neck pain  (Primary Area of Pain) (B (L>R). Diagnoses of Occipital neuralgia of left side (Tertiary Area of Pain), Chronic left shoulder pain (Secondary Area of Pain), Chronic right-sided low back pain without sciatica (Fourth Area of Pain), Chronic pain of both knees (R>L), Chronic pain syndrome, Long term current use of opiate analgesic, Pharmacologic therapy, Disorder of skeletal system, and Problems influencing health status were also pertinent to this visit.  Visit Diagnosis: 1. Chronic neck pain  (Primary Area of Pain) (B (L>R)   2. Occipital neuralgia of left side [facility] Area of Pain)   3. Chronic left shoulder pain (Secondary Area of Pain)   4. Chronic right-sided low back pain without sciatica (Fourth Area of Pain)   5. Chronic pain of both knees (R>L)   6. Chronic pain syndrome   7. Long term current use of opiate analgesic   8. Pharmacologic therapy   9. Disorder of skeletal system   10. Problems influencing health status    Plan of Care  Initial treatment plan:  Please be advised that as per protocol, today's visit has been an evaluation only. We have not taken over the patient's controlled substance management.  Problem-specific plan: No problem-specific Assessment & Plan notes found for this encounter.  Ordered Lab-work, Procedure(s), Referral(s), & Consult(s): Orders Placed This Encounter  Procedures  . DG Cervical Spine Complete  . DG Shoulder Left  . Compliance Drug Analysis, Ur  . Magnesium  . Sedimentation rate  . 25-Hydroxyvitamin D Lcms D2+D3  . C-reactive protein  . Ambulatory referral to  Psychology   Pharmacotherapy: Medications ordered:  No orders of the defined types were placed in this encounter.  Medications administered during this visit: Samuel Roberts had no medications administered during this visit.   Pharmacotherapy under consideration:  Opioid Analgesics: The patient was informed that there is no guarantee that he would be a candidate for opioid  analgesics. The decision will be made following CDC guidelines. This decision will be based on the results of diagnostic studies, as well as Samuel Roberts risk profile.  Membrane stabilizer: To be determined at a later time Muscle relaxant: To be determined at a later time NSAID: To be determined at a later time Other analgesic(s): To be determined at a later time   Interventional therapies under consideration: Samuel Roberts was informed that there is no guarantee that he would be a candidate for interventional therapies. The decision will be based on the results of diagnostic studies, as well as Samuel Roberts's risk profile.  Possible procedure(s): Diagnostic left-sided cervical facet nerve block Possible left-sided cervical facet RFA Diagnostic left suprascapular nerve block Diagnostic trigger point injection Diagnostic right lumbar facet nerve block Diagnostic right lumbar facet RFA Diagnostic Left knee Hyalgan series Diagnostic Right knee genicular nerve block Possible right knee genicular RFA    Provider-requested follow-up: Return for 2nd Visit, w/ Dr. Dossie Arbour, after [facility] eval.  No future appointments.  Primary Care Physician: [name], MD Location: [facility] Note by:  Date: 06/24/2017; Time: 10:16 AM  Pain Score Disclaimer: We use the NRS-11 scale. This is a self-reported, subjective measurement of pain severity with only modest accuracy. It is used primarily to identify changes within a particular patient. It must be understood that outpatient pain scales are  significantly less accurate that those used for research, where they can be applied under ideal controlled circumstances with minimal exposure to variables. In reality, the score is likely to be a combination of pain intensity and pain affect, where pain affect describes the degree of emotional arousal or changes in action readiness caused by the sensory experience of pain. Factors such as social and work situation, setting, emotional state, anxiety levels, expectation, and prior pain experience may influence pain perception and show large inter-individual differences that may also be affected by time variables.  Patient instructions provided during this appointment: Patient Instructions   ____________________________________________________________________________________________  Appointment Policy Summary  It is our goal and responsibility to provide the medical community with assistance in the evaluation and management of patients with chronic pain. Unfortunately our resources are limited. Because we do not have an unlimited amount of time, or available appointments, we are required to closely monitor and manage their use. The following rules exist to maximize their use:  Patient's responsibilities: 1. Punctuality:  At what time should I arrive? You should be physically present in our office 30 minutes before your scheduled appointment. Your scheduled appointment is with your assigned healthcare provider. However, it takes 5-10 minutes to be "checked-in", and another 15 minutes for the nurses to do the admission. If you arrive to our office at the time you were given for your appointment, you will end up being at least 20-25 minutes late to your appointment with the provider. 2. Tardiness:  What happens if I arrive only a few minutes after my scheduled appointment time? You will need to reschedule your appointment. The cutoff is your appointment time. This is why it is so important that you arrive  at least 30 minutes before that appointment. If you have an appointment scheduled for 10:00 AM and you arrive at 10:01, you will be required to reschedule your appointment.  3. Plan ahead:  Always assume that you will encounter traffic on your way in. Plan for it. If you are dependent on a driver, make sure they understand these rules and the need to arrive early.  4. Other appointments and responsibilities:  Avoid scheduling any other appointments before or after your pain clinic appointments.  5. Be prepared:  Write down everything that you need to discuss with your healthcare provider and give this information to the admitting nurse. Write down the medications that you will need refilled. Bring your pills and bottles (even the empty ones), to all of your appointments, except for those where a procedure is scheduled. 6. No children or pets:  Find someone to take care of them. It is not appropriate to bring them in. 7. Scheduling changes:  We request "advanced notification" of any changes or cancellations. 8. Advanced notification:  Defined as a time period of more than 24 hours prior to the originally scheduled appointment. This allows for the appointment to be offered to other patients. 9. Rescheduling:  When a visit is rescheduled, it will require the cancellation of the original appointment. For this reason they both fall within the category of "Cancellations".  10. Cancellations:  They require advanced notification. Any cancellation less than 24 hours before the  appointment will be recorded as a "No Show". 11. No Show:  Defined as an unkept appointment where the patient failed to notify or declare to the practice their intention or inability to keep the appointment.  Corrective process for repeat offenders:  1. Tardiness: Three (3) episodes of rescheduling due to late arrivals will be recorded as one (1) "No Show". 2. Cancellation or reschedule: Three (3) cancellations or rescheduling  will be recorded as one (1) "No Show". 3. "No Shows": Three (3) "No Shows" within a 12 month period will result in discharge from the practice. ____________________________________________________________________________________________  ____________________________________________________________________________________________  Pain Scale  Introduction: The pain score used by this practice is the Verbal Numerical Rating Scale (VNRS-11). This is an 11-point scale. It is for adults and children 10 years or older. There are significant differences in how the pain score is reported, used, and applied. Forget everything you learned in the past and learn this scoring system.  General Information: The scale should reflect your current level of pain. Unless you are specifically asked for the level of your worst pain, or your average pain. If you are asked for one of these two, then it should be understood that it is over the past 24 hours.  Basic Activities of Daily Living (ADL): Personal hygiene, dressing, eating, transferring, and using restroom.  Instructions: Most patients tend to report their level of pain as a combination of two factors, their physical pain and their psychosocial pain. This last one is also known as "suffering" and it is reflection of how physical pain affects you socially and psychologically. From now on, report them separately. From this point on, when asked to report your pain level, report only your physical pain. Use the following table for reference.  Pain Clinic Pain Levels (0-5/10)  Pain Level Score  Description  No Pain 0   Mild pain 1 Nagging, annoying, but does not interfere with basic activities of daily living (ADL). Patients are able to eat, bathe, get dressed, toileting (being able to get on and off the toilet and perform personal hygiene functions), transfer (move in and out of bed or a chair without assistance), and maintain continence (able to control bladder and  bowel functions). Blood pressure and heart rate are unaffected. A normal heart rate for a healthy adult ranges from 60 to 100 bpm (beats per minute).   Mild to moderate pain 2 Noticeable and distracting. Impossible to  hide from other people. More frequent flare-ups. Still possible to adapt and function close to normal. It can be very annoying and may have occasional stronger flare-ups. With discipline, patients may get used to it and adapt.   Moderate pain 3 Interferes significantly with activities of daily living (ADL). It becomes difficult to feed, bathe, get dressed, get on and off the toilet or to perform personal hygiene functions. Difficult to get in and out of bed or a chair without assistance. Very distracting. With effort, it can be ignored when deeply involved in activities.   Moderately severe pain 4 Impossible to ignore for more than a few minutes. With effort, patients may still be able to manage work or participate in some social activities. Very difficult to concentrate. Signs of autonomic nervous system discharge are evident: dilated pupils (mydriasis); mild sweating (diaphoresis); sleep interference. Heart rate becomes elevated (>115 bpm). Diastolic blood pressure (lower number) rises above 100 mmHg. Patients find relief in laying down and not moving.   Severe pain 5 Intense and extremely unpleasant. Associated with frowning face and frequent crying. Pain overwhelms the senses.  Ability to do any activity or maintain social relationships becomes significantly limited. Conversation becomes difficult. Pacing back and forth is common, as getting into a comfortable position is nearly impossible. Pain wakes you up from deep sleep. Physical signs will be obvious: pupillary dilation; increased sweating; goosebumps; brisk reflexes; cold, clammy hands and feet; nausea, vomiting or dry heaves; loss of appetite; significant sleep disturbance with inability to fall asleep or to remain asleep. When  persistent, significant weight loss is observed due to the complete loss of appetite and sleep deprivation.  Blood pressure and heart rate becomes significantly elevated. Caution: If elevated blood pressure triggers a pounding headache associated with blurred vision, then the patient should immediately seek attention at an urgent or emergency care unit, as these may be signs of an impending stroke.    Emergency Department Pain Levels (6-10/10)  Emergency Room Pain 6 Severely limiting. Requires emergency care and should not be seen or managed at an outpatient pain management facility. Communication becomes difficult and requires great effort. Assistance to reach the emergency department may be required. Facial flushing and profuse sweating along with potentially dangerous increases in heart rate and blood pressure will be evident.   Distressing pain 7 Self-care is very difficult. Assistance is required to transport, or use restroom. Assistance to reach the emergency department will be required. Tasks requiring coordination, such as bathing and getting dressed become very difficult.   Disabling pain 8 Self-care is no longer possible. At this level, pain is disabling. The individual is unable to do even the most "basic" activities such as walking, eating, bathing, dressing, transferring to a bed, or toileting. Fine motor skills are lost. It is difficult to think clearly.   Incapacitating pain 9 Pain becomes incapacitating. Thought processing is no longer possible. Difficult to remember your own name. Control of movement and coordination are lost.   The worst pain imaginable 10 At this level, most patients pass out from pain. When this level is reached, collapse of the autonomic nervous system occurs, leading to a sudden drop in blood pressure and heart rate. This in turn results in a temporary and dramatic drop in blood flow to the brain, leading to a loss of consciousness. Fainting is one of the body's  self defense mechanisms. Passing out puts the brain in a calmed state and causes it to shut down for a while, in  order to begin the healing process.    Summary: 1. Refer to this scale when providing [place] with your pain level. 2. Be accurate and careful when reporting your pain level. This will help with your care. 3. Over-reporting your pain level will lead to loss of credibility. 4. Even a level of 1/10 means that there is pain and will be treated at our facility. 5. High, inaccurate reporting will be documented as "Symptom Exaggeration", leading to loss of credibility and suspicions of possible secondary gains such as obtaining more narcotics, or wanting to appear disabled, for fraudulent reasons. 6. Only pain levels of 5 or below will be seen at our facility. 7. Pain levels of 6 and above will be sent to the Emergency Department and the appointment cancelled. ____________________________________________________________________________________________

## 2017-06-24 NOTE — Progress Notes (Signed)
Safety precautions to be maintained throughout the outpatient stay will include: orient to surroundings, keep bed in low position, maintain call bell within reach at all times, provide assistance with transfer out of bed and ambulation.   Patients spouse states that at night when patient rolls over he actually wakes her up moaning and groaning with pain from moving.

## 2017-06-24 NOTE — Patient Instructions (Signed)
____________________________________________________________________________________________  Appointment Policy Summary  It is our goal and responsibility to provide the medical community with assistance in the evaluation and management of patients with chronic pain. Unfortunately our resources are limited. Because we do not have an unlimited amount of time, or available appointments, we are required to closely monitor and manage their use. The following rules exist to maximize their use:  Patient's responsibilities: 1. Punctuality:  At what time should I arrive? You should be physically present in our office 30 minutes before your scheduled appointment. Your scheduled appointment is with your assigned healthcare provider. However, it takes 5-10 minutes to be "checked-in", and another 15 minutes for the nurses to do the admission. If you arrive to our office at the time you were given for your appointment, you will end up being at least 20-25 minutes late to your appointment with the provider. 2. Tardiness:  What happens if I arrive only a few minutes after my scheduled appointment time? You will need to reschedule your appointment. The cutoff is your appointment time. This is why it is so important that you arrive at least 30 minutes before that appointment. If you have an appointment scheduled for 10:00 AM and you arrive at 10:01, you will be required to reschedule your appointment.  3. Plan ahead:  Always assume that you will encounter traffic on your way in. Plan for it. If you are dependent on a driver, make sure they understand these rules and the need to arrive early. 4. Other appointments and responsibilities:  Avoid scheduling any other appointments before or after your pain clinic appointments.  5. Be prepared:  Write down everything that you need to discuss with your healthcare provider and give this information to the admitting nurse. Write down the medications that you will need  refilled. Bring your pills and bottles (even the empty ones), to all of your appointments, except for those where a procedure is scheduled. 6. No children or pets:  Find someone to take care of them. It is not appropriate to bring them in. 7. Scheduling changes:  We request "advanced notification" of any changes or cancellations. 8. Advanced notification:  Defined as a time period of more than 24 hours prior to the originally scheduled appointment. This allows for the appointment to be offered to other patients. 9. Rescheduling:  When a visit is rescheduled, it will require the cancellation of the original appointment. For this reason they both fall within the category of "Cancellations".  10. Cancellations:  They require advanced notification. Any cancellation less than 24 hours before the  appointment will be recorded as a "No Show". 11. No Show:  Defined as an unkept appointment where the patient failed to notify or declare to the practice their intention or inability to keep the appointment.  Corrective process for repeat offenders:  1. Tardiness: Three (3) episodes of rescheduling due to late arrivals will be recorded as one (1) "No Show". 2. Cancellation or reschedule: Three (3) cancellations or rescheduling will be recorded as one (1) "No Show". 3. "No Shows": Three (3) "No Shows" within a 12 month period will result in discharge from the practice. ____________________________________________________________________________________________  ____________________________________________________________________________________________  Pain Scale  Introduction: The pain score used by this practice is the Verbal Numerical Rating Scale (VNRS-11). This is an 11-point scale. It is for adults and children 10 years or older. There are significant differences in how the pain score is reported, used, and applied. Forget everything you learned in the past and learn  this scoring system.  General  Information: The scale should reflect your current level of pain. Unless you are specifically asked for the level of your worst pain, or your average pain. If you are asked for one of these two, then it should be understood that it is over the past 24 hours.  Basic Activities of Daily Living (ADL): Personal hygiene, dressing, eating, transferring, and using restroom.  Instructions: Most patients tend to report their level of pain as a combination of two factors, their physical pain and their psychosocial pain. This last one is also known as "suffering" and it is reflection of how physical pain affects you socially and psychologically. From now on, report them separately. From this point on, when asked to report your pain level, report only your physical pain. Use the following table for reference.  Pain Clinic Pain Levels (0-5/10)  Pain Level Score  Description  No Pain 0   Mild pain 1 Nagging, annoying, but does not interfere with basic activities of daily living (ADL). Patients are able to eat, bathe, get dressed, toileting (being able to get on and off the toilet and perform personal hygiene functions), transfer (move in and out of bed or a chair without assistance), and maintain continence (able to control bladder and bowel functions). Blood pressure and heart rate are unaffected. A normal heart rate for a healthy adult ranges from 60 to 100 bpm (beats per minute).   Mild to moderate pain 2 Noticeable and distracting. Impossible to hide from other people. More frequent flare-ups. Still possible to adapt and function close to normal. It can be very annoying and may have occasional stronger flare-ups. With discipline, patients may get used to it and adapt.   Moderate pain 3 Interferes significantly with activities of daily living (ADL). It becomes difficult to feed, bathe, get dressed, get on and off the toilet or to perform personal hygiene functions. Difficult to get in and out of bed or a chair  without assistance. Very distracting. With effort, it can be ignored when deeply involved in activities.   Moderately severe pain 4 Impossible to ignore for more than a few minutes. With effort, patients may still be able to manage work or participate in some social activities. Very difficult to concentrate. Signs of autonomic nervous system discharge are evident: dilated pupils (mydriasis); mild sweating (diaphoresis); sleep interference. Heart rate becomes elevated (>115 bpm). Diastolic blood pressure (lower number) rises above 100 mmHg. Patients find relief in laying down and not moving.   Severe pain 5 Intense and extremely unpleasant. Associated with frowning face and frequent crying. Pain overwhelms the senses.  Ability to do any activity or maintain social relationships becomes significantly limited. Conversation becomes difficult. Pacing back and forth is common, as getting into a comfortable position is nearly impossible. Pain wakes you up from deep sleep. Physical signs will be obvious: pupillary dilation; increased sweating; goosebumps; brisk reflexes; cold, clammy hands and feet; nausea, vomiting or dry heaves; loss of appetite; significant sleep disturbance with inability to fall asleep or to remain asleep. When persistent, significant weight loss is observed due to the complete loss of appetite and sleep deprivation.  Blood pressure and heart rate becomes significantly elevated. Caution: If elevated blood pressure triggers a pounding headache associated with blurred vision, then the patient should immediately seek attention at an urgent or emergency care unit, as these may be signs of an impending stroke.    Emergency Department Pain Levels (6-10/10)  Emergency Room Pain 6 Severely  limiting. Requires emergency care and should not be seen or managed at an outpatient pain management facility. Communication becomes difficult and requires great effort. Assistance to reach the emergency department  may be required. Facial flushing and profuse sweating along with potentially dangerous increases in heart rate and blood pressure will be evident.   Distressing pain 7 Self-care is very difficult. Assistance is required to transport, or use restroom. Assistance to reach the emergency department will be required. Tasks requiring coordination, such as bathing and getting dressed become very difficult.   Disabling pain 8 Self-care is no longer possible. At this level, pain is disabling. The individual is unable to do even the most "basic" activities such as walking, eating, bathing, dressing, transferring to a bed, or toileting. Fine motor skills are lost. It is difficult to think clearly.   Incapacitating pain 9 Pain becomes incapacitating. Thought processing is no longer possible. Difficult to remember your own name. Control of movement and coordination are lost.   The worst pain imaginable 10 At this level, most patients pass out from pain. When this level is reached, collapse of the autonomic nervous system occurs, leading to a sudden drop in blood pressure and heart rate. This in turn results in a temporary and dramatic drop in blood flow to the brain, leading to a loss of consciousness. Fainting is one of the body's self defense mechanisms. Passing out puts the brain in a calmed state and causes it to shut down for a while, in order to begin the healing process.    Summary: 1. Refer to this scale when providing [place] with your pain level. 2. Be accurate and careful when reporting your pain level. This will help with your care. 3. Over-reporting your pain level will lead to loss of credibility. 4. Even a level of 1/10 means that there is pain and will be treated at our facility. 5. High, inaccurate reporting will be documented as "Symptom Exaggeration", leading to loss of credibility and suspicions of possible secondary gains such as obtaining more narcotics, or wanting to appear disabled, for  fraudulent reasons. 6. Only pain levels of 5 or below will be seen at our facility. 7. Pain levels of 6 and above will be sent to the Emergency Department and the appointment cancelled. ____________________________________________________________________________________________

## 2017-06-25 NOTE — Progress Notes (Signed)
Results were reviewed and found to be: abnormal  Further testing may be useful; no acute abnormality  Review would suggest interventional pain management techniques may be of benefit

## 2017-06-27 LAB — 25-HYDROXY VITAMIN D LCMS D2+D3
25-Hydroxy, Vitamin D-2: 2.2 ng/mL
25-Hydroxy, Vitamin D: 37 ng/mL

## 2017-06-27 LAB — MAGNESIUM: Magnesium: 2.4 mg/dL — ABNORMAL HIGH (ref 1.6–2.3)

## 2017-06-27 LAB — 25-HYDROXYVITAMIN D LCMS D2+D3: 25-HYDROXY, VITAMIN D-3: 35 ng/mL

## 2017-06-27 LAB — SEDIMENTATION RATE: SED RATE: 11 mm/h (ref 0–30)

## 2017-06-27 LAB — C-REACTIVE PROTEIN: CRP: 2.8 mg/L (ref 0.0–4.9)

## 2017-06-29 LAB — COMPLIANCE DRUG ANALYSIS, UR

## 2017-07-13 ENCOUNTER — Ambulatory Visit
Admission: RE | Admit: 2017-07-13 | Discharge: 2017-07-13 | Disposition: A | Discharge status: Home / Self Care | Payer: 59 | Source: Ambulatory Visit | Attending: Pain Medicine | Admitting: Pain Medicine

## 2017-07-13 ENCOUNTER — Encounter: Payer: Self-pay | Admitting: Pain Medicine

## 2017-07-13 ENCOUNTER — Ambulatory Visit: Payer: 59 | Attending: Pain Medicine | Admitting: Pain Medicine

## 2017-07-13 VITALS — BP 135/81 | HR 71 | Temp 97.9°F | Resp 16 | Ht 70.0 in | Wt 200.0 lb

## 2017-07-13 DIAGNOSIS — M545 Low back pain, unspecified: Secondary | ICD-10-CM

## 2017-07-13 DIAGNOSIS — M5481 Occipital neuralgia: Secondary | ICD-10-CM | POA: Diagnosis not present

## 2017-07-13 DIAGNOSIS — M25561 Pain in right knee: Secondary | ICD-10-CM | POA: Insufficient documentation

## 2017-07-13 DIAGNOSIS — M50321 Other cervical disc degeneration at C4-C5 level: Secondary | ICD-10-CM | POA: Diagnosis not present

## 2017-07-13 DIAGNOSIS — Z79891 Long term (current) use of opiate analgesic: Secondary | ICD-10-CM | POA: Diagnosis not present

## 2017-07-13 DIAGNOSIS — M2578 Osteophyte, vertebrae: Secondary | ICD-10-CM | POA: Diagnosis not present

## 2017-07-13 DIAGNOSIS — F329 Major depressive disorder, single episode, unspecified: Secondary | ICD-10-CM | POA: Diagnosis not present

## 2017-07-13 DIAGNOSIS — M4802 Spinal stenosis, cervical region: Secondary | ICD-10-CM | POA: Diagnosis not present

## 2017-07-13 DIAGNOSIS — G8929 Other chronic pain: Secondary | ICD-10-CM | POA: Diagnosis present

## 2017-07-13 DIAGNOSIS — Z789 Other specified health status: Secondary | ICD-10-CM

## 2017-07-13 DIAGNOSIS — M542 Cervicalgia: Secondary | ICD-10-CM | POA: Insufficient documentation

## 2017-07-13 DIAGNOSIS — M1711 Unilateral primary osteoarthritis, right knee: Secondary | ICD-10-CM

## 2017-07-13 DIAGNOSIS — M5030 Other cervical disc degeneration, unspecified cervical region: Secondary | ICD-10-CM | POA: Insufficient documentation

## 2017-07-13 DIAGNOSIS — I209 Angina pectoris, unspecified: Secondary | ICD-10-CM | POA: Diagnosis not present

## 2017-07-13 DIAGNOSIS — M25512 Pain in left shoulder: Secondary | ICD-10-CM | POA: Diagnosis not present

## 2017-07-13 DIAGNOSIS — K5190 Ulcerative colitis, unspecified, without complications: Secondary | ICD-10-CM | POA: Insufficient documentation

## 2017-07-13 DIAGNOSIS — M79602 Pain in left arm: Secondary | ICD-10-CM | POA: Diagnosis not present

## 2017-07-13 DIAGNOSIS — G894 Chronic pain syndrome: Secondary | ICD-10-CM | POA: Insufficient documentation

## 2017-07-13 DIAGNOSIS — M899 Disorder of bone, unspecified: Secondary | ICD-10-CM | POA: Diagnosis not present

## 2017-07-13 DIAGNOSIS — Z79899 Other long term (current) drug therapy: Secondary | ICD-10-CM

## 2017-07-13 DIAGNOSIS — M9981 Other biomechanical lesions of cervical region: Secondary | ICD-10-CM

## 2017-07-13 DIAGNOSIS — M47812 Spondylosis without myelopathy or radiculopathy, cervical region: Secondary | ICD-10-CM | POA: Insufficient documentation

## 2017-07-13 DIAGNOSIS — M25562 Pain in left knee: Secondary | ICD-10-CM

## 2017-07-13 MED ORDER — TRAMADOL HCL 50 MG PO TABS: 50.0000 mg | ORAL_TABLET | Freq: Two times a day (BID) | ORAL | 0 refills | Status: DC

## 2017-07-13 MED ORDER — TRAMADOL HCL 50 MG PO TABS: 50.0000 mg | ORAL_TABLET | Freq: Four times a day (QID) | ORAL | 0 refills | Status: DC | PRN

## 2017-07-13 NOTE — Patient Instructions (Addendum)
____________________________________________________________________________________________  Pain Scale  Introduction: The pain score used by this practice is the Verbal Numerical Rating Scale (VNRS-11). This is an 11-point scale. It is for adults and children 10 years or older. There are significant differences in how the pain score is reported, used, and applied. Forget everything you learned in the past and learn this scoring system.  General Information: The scale should reflect your current level of pain. Unless you are specifically asked for the level of your worst pain, or your average pain. If you are asked for one of these two, then it should be understood that it is over the past 24 hours.  Basic Activities of Daily Living (ADL): Personal hygiene, dressing, eating, transferring, and using restroom.  Instructions: Most patients tend to report their level of pain as a combination of two factors, their physical pain and their psychosocial pain. This last one is also known as "suffering" and it is reflection of how physical pain affects you socially and psychologically. From now on, report them separately. From this point on, when asked to report your pain level, report only your physical pain. Use the following table for reference.  Pain Clinic Pain Levels (0-5/10)  Pain Level Score  Description  No Pain 0   Mild pain 1 Nagging, annoying, but does not interfere with basic activities of daily living (ADL). Patients are able to eat, bathe, get dressed, toileting (being able to get on and off the toilet and perform personal hygiene functions), transfer (move in and out of bed or a chair without assistance), and maintain continence (able to control bladder and bowel functions). Blood pressure and heart rate are unaffected. A normal heart rate for a healthy adult ranges from 60 to 100 bpm (beats per minute).   Mild to moderate pain 2 Noticeable and distracting. Impossible to hide from other  people. More frequent flare-ups. Still possible to adapt and function close to normal. It can be very annoying and may have occasional stronger flare-ups. With discipline, patients may get used to it and adapt.   Moderate pain 3 Interferes significantly with activities of daily living (ADL). It becomes difficult to feed, bathe, get dressed, get on and off the toilet or to perform personal hygiene functions. Difficult to get in and out of bed or a chair without assistance. Very distracting. With effort, it can be ignored when deeply involved in activities.   Moderately severe pain 4 Impossible to ignore for more than a few minutes. With effort, patients may still be able to manage work or participate in some social activities. Very difficult to concentrate. Signs of autonomic nervous system discharge are evident: dilated pupils (mydriasis); mild sweating (diaphoresis); sleep interference. Heart rate becomes elevated (>115 bpm). Diastolic blood pressure (lower number) rises above 100 mmHg. Patients find relief in laying down and not moving.   Severe pain 5 Intense and extremely unpleasant. Associated with frowning face and frequent crying. Pain overwhelms the senses.  Ability to do any activity or maintain social relationships becomes significantly limited. Conversation becomes difficult. Pacing back and forth is common, as getting into a comfortable position is nearly impossible. Pain wakes you up from deep sleep. Physical signs will be obvious: pupillary dilation; increased sweating; goosebumps; brisk reflexes; cold, clammy hands and feet; nausea, vomiting or dry heaves; loss of appetite; significant sleep disturbance with inability to fall asleep or to remain asleep. When persistent, significant weight loss is observed due to the complete loss of appetite and sleep deprivation.  Blood  pressure and heart rate becomes significantly elevated. Caution: If elevated blood pressure triggers a pounding headache  associated with blurred vision, then the patient should immediately seek attention at an urgent or emergency care unit, as these may be signs of an impending stroke.    Emergency Department Pain Levels (6-10/10)  Emergency Room Pain 6 Severely limiting. Requires emergency care and should not be seen or managed at an outpatient pain management facility. Communication becomes difficult and requires great effort. Assistance to reach the emergency department may be required. Facial flushing and profuse sweating along with potentially dangerous increases in heart rate and blood pressure will be evident.   Distressing pain 7 Self-care is very difficult. Assistance is required to transport, or use restroom. Assistance to reach the emergency department will be required. Tasks requiring coordination, such as bathing and getting dressed become very difficult.   Disabling pain 8 Self-care is no longer possible. At this level, pain is disabling. The individual is unable to do even the most "basic" activities such as walking, eating, bathing, dressing, transferring to a bed, or toileting. Fine motor skills are lost. It is difficult to think clearly.   Incapacitating pain 9 Pain becomes incapacitating. Thought processing is no longer possible. Difficult to remember your own name. Control of movement and coordination are lost.   The worst pain imaginable 10 At this level, most patients pass out from pain. When this level is reached, collapse of the autonomic nervous system occurs, leading to a sudden drop in blood pressure and heart rate. This in turn results in a temporary and dramatic drop in blood flow to the brain, leading to a loss of consciousness. Fainting is one of the body's self defense mechanisms. Passing out puts the brain in a calmed state and causes it to shut down for a while, in order to begin the healing process.    Summary: 1. Refer to this scale when providing [place] with your pain level. 2. Be  accurate and careful when reporting your pain level. This will help with your care. 3. Over-reporting your pain level will lead to loss of credibility. 4. Even a level of 1/10 means that there is pain and will be treated at our facility. 5. High, inaccurate reporting will be documented as "Symptom Exaggeration", leading to loss of credibility and suspicions of possible secondary gains such as obtaining more narcotics, or wanting to appear disabled, for fraudulent reasons. 6. Only pain levels of 5 or below will be seen at our facility. 7. Pain levels of 6 and above will be sent to the Emergency Department and the appointment cancelled. ____________________________________________________________________________________________   ____________________________________________________________________________________________  Medication Rules  Applies to: All patients receiving prescriptions (written or electronic).  Pharmacy of record: Pharmacy where electronic prescriptions will be sent. If written prescriptions are taken to a different pharmacy, please inform the nursing staff. The pharmacy listed in the electronic medical record should be the one where you would like electronic prescriptions to be sent.  Prescription refills: Only during scheduled appointments. Applies to both, written and electronic prescriptions.  NOTE: The following applies primarily to controlled substances (Opioid* Pain Medications).   Patient's responsibilities: 1. Pain Pills: Bring all pain pills to every appointment (except for procedure appointments). 2. Pill Bottles: Bring pills in original pharmacy bottle. Always bring newest bottle. Bring bottle, even if empty. 3. Medication refills: You are responsible for knowing and keeping track of what medications you need refilled. The day before your appointment, write a list of all  prescriptions that need to be refilled. Bring that list to your appointment and give it to the  admitting nurse. Prescriptions will be written only during appointments. If you forget a medication, it will not be "Called in", "Faxed", or "electronically sent". You will need to get another appointment to get these prescribed. 4. Prescription Accuracy: You are responsible for carefully inspecting your prescriptions before leaving our office. Have the discharge nurse carefully go over each prescription with you, before taking them home. Make sure that your name is accurately spelled, that your address is correct. Check the name and dose of your medication to make sure it is accurate. Check the number of pills, and the written instructions to make sure they are clear and accurate. Make sure that you are given enough medication to last until your next medication refill appointment. 5. Taking Medication: Take medication as prescribed. Never take more pills than instructed. Never take medication more frequently than prescribed. Taking less pills or less frequently is permitted and encouraged, when it comes to controlled substances (written prescriptions).  6. Inform other Doctors: Always inform, all of your healthcare providers, of all the medications you take. 7. Pain Medication from other Providers: You are not allowed to accept any additional pain medication from any other Doctor or Healthcare provider. There are two exceptions to this rule. (see below) In the event that you require additional pain medication, you are responsible for notifying us, as stated below. 8. Medication Agreement: You are responsible for carefully reading and following our Medication Agreement. This must be signed before receiving any prescriptions from our practice. Safely store a copy of your signed Agreement. Violations to the Agreement will result in no further prescriptions. (Additional copies of our Medication Agreement are available upon request.) 9. Laws, Rules, & Regulations: All patients are expected to follow all Federal  and [employer], [employer], Rules, [employer]. Ignorance of the Laws does not constitute a valid excuse. The use of any illegal substances is prohibited. 10. Adopted CDC guidelines & recommendations: Target dosing levels will be at or below 60 MME/day. Use of benzodiazepines** is not recommended.  Exceptions: There are only two exceptions to the rule of not receiving pain medications from other Healthcare Providers. 1. Exception #1 (Emergencies): In the event of an emergency (i.e.: accident requiring emergency care), you are allowed to receive additional pain medication. However, you are responsible for: As soon as you are able, call our office [phone], at any time of the day or night, and leave a message stating your name, the date and nature of the emergency, and the name and dose of the medication prescribed. In the event that your call is answered by a member of our staff, make sure to document and save the date, time, and the name of the person that took your information.  2. Exception #2 (Planned Surgery): In the event that you are scheduled by another doctor or dentist to have any type of surgery or procedure, you are allowed (for a period no longer than 30 days), to receive additional pain medication, for the acute post-op pain. However, in this case, you are responsible for picking up a copy of our "Post-op Pain Management for Surgeons" handout, and giving it to your surgeon or dentist. This document is available at our office, and does not require an appointment to obtain it. Simply go to our office during business hours (Monday-Thursday from 8:00 AM to 4:00 PM) (Friday 8:00 AM to 12:00 Noon) or if you  have a scheduled appointment with [place], prior to your surgery, and ask for it by name. In addition, you will need to provide [place] with your name, name of your surgeon, type of surgery, and date of procedure or surgery.  *Opioid medications include: morphine, codeine, oxycodone, oxymorphone,  hydrocodone, hydromorphone, meperidine, tramadol, tapentadol, buprenorphine, fentanyl, methadone. **Benzodiazepine medications include: diazepam (Valium), alprazolam (Xanax), clonazepam (Klonopine), lorazepam (Ativan), clorazepate (Tranxene), chlordiazepoxide (Librium), estazolam (Prosom), oxazepam (Serax), temazepam (Restoril), triazolam (Halcion) (Last updated: 06/18/2017) ____________________________________________________________________________________________  ____________________________________________________________________________________________  Medication Recommendations and Reminders  Applies to: All patients receiving prescriptions (written and/or electronic).  Medication Rules & Regulations: These rules and regulations exist for your safety and that of others. They are not flexible and neither are we. Dismissing or ignoring them will be considered "non-compliance" with medication therapy, resulting in complete and irreversible termination of such therapy. (See document titled "Medication Rules" for more details.) In all conscience, because of safety reasons, we cannot continue providing a therapy where the patient does not follow instructions.  Pharmacy of record:   Definition: This is the pharmacy where your electronic prescriptions will be sent.   We do not endorse any particular pharmacy.  You are not restricted in your choice of pharmacy.  The pharmacy listed in the electronic medical record should be the one where you want electronic prescriptions to be sent.  If you choose to change pharmacy, simply notify our nursing staff of your choice of new pharmacy.  Recommendations:  Keep all of your pain medications in a safe place, under lock and key, even if you live alone.   After you fill your prescription, take 1 week's worth of pills and put them away in a safe place. You should keep a separate, properly labeled bottle for this purpose. The remainder should be kept  in the original bottle. Use this as your primary supply, until it runs out. Once it's gone, then you know that you have 1 week's worth of medicine, and it is time to come in for a prescription refill. If you do this correctly, it is unlikely that you will ever run out of medicine.  To make sure that the above recommendation works, it is very important that you make sure your medication refill appointments are scheduled at least 1 week before you run out of medicine. To do this in an effective manner, make sure that you do not leave the office without scheduling your next medication management appointment. Always ask the nursing staff to show you in your prescription , when your medication will be running out. Then arrange for the receptionist to get you a return appointment, at least 7 days before you run out of medicine. Do not wait until you have 1 or 2 pills left, to come in. This is very poor planning and does not take into consideration that we may need to cancel appointments due to bad weather, sickness, or emergencies affecting our staff.  Prescription refills and/or changes in medication(s):   Prescription refills, and/or changes in dose or medication, will be conducted only during scheduled medication management appointments. (Applies to both, written and electronic prescriptions.)  No refills on procedure days. No medication will be changed or started on procedure days. No changes, adjustments, and/or refills will be conducted on a procedure day. Doing so will interfere with the diagnostic portion of the procedure.  No phone refills. No medications will be "called into the pharmacy".  No Fax refills.  No weekend refills.  No Holliday refills.  No after hours refills.  Remember:  Business hours are:  Monday to Thursday 8:00 AM to 4:00 PM Provider's Schedule: Dionisio David, NP - Appointments are:  Medication management: Monday to Thursday 8:00 AM to 4:00 PM Milinda Pointer, MD -  Appointments are:  Medication management: Monday and Wednesday 8:00 AM to 4:00 PM Procedure day: Tuesday and Thursday 7:30 AM to 4:00 PM Gillis Santa, MD - Appointments are:  Medication management: Tuesday and Thursday 8:00 AM to 4:00 PM Procedure day: Monday and Wednesday 7:30 AM to 4:00 PM (Last update: 06/18/2017) ____________________________________________________________________________________________  ____________________________________________________________________________________________  CANNABIDIOL (AKA: CBD Oil or Pills)  Applies to: All patients receiving prescriptions of controlled substances (written and/or electronic).  General Information: Cannabidiol (CBD) was discovered in 5. It is one of some 113 identified cannabinoids in cannabis (Marijuana) plants, accounting for up to 40% of the plant's extract. As of 2018, preliminary clinical research on cannabidiol included studies of anxiety, cognition, movement disorders, and pain.  Cannabidiol is consummed in multiple ways, including inhalation of cannabis smoke or vapor, as an aerosol spray into the cheek, and by mouth. It may be supplied as CBD oil containing CBD as the active ingredient (no added tetrahydrocannabinol (THC) or terpenes), a full-plant CBD-dominant hemp extract oil, capsules, dried cannabis, or as a liquid solution. CBD is thought not have the same psychoactivity as THC, and may affect the actions of THC. Studies suggest that CBD may interact with different biological targets, including cannabinoid receptors and other neurotransmitter receptors. As of 2018 the mechanism of action for its biological effects has not been determined.  In the [place], cannabidiol has a limited approval by the Food and Drug Administration (FDA) for treatment of only two types of epilepsy disorders. The side effects of long-term use of the drug include somnolence, decreased appetite, diarrhea, fatigue, malaise, weakness,  sleeping problems, and others.  CBD remains a Schedule I drug prohibited for any use.  Legality: Some manufacturers ship CBD products nationally, an illegal action which the FDA has not enforced in 2018, with CBD remaining the subject of an FDA investigational new drug evaluation, and is not considered legal as a dietary supplement or food ingredient as of December 2018. Federal illegality has made it difficult historically to conduct research on CBD. CBD is openly sold in head shops and health food stores in some states where such sales have not been explicitly legalized.  Warning: Because it is not FDA approved for general use or treatment of pain, it is not required to undergo the same manufacturing controls as prescription drugs.  This means that the available cannabidiol (CBD) may be contaminated with THC.  If this is the case, it will trigger a positive urine drug screen (UDS) test for cannabinoids (Marijuana).  Because a positive UDS for illicit substances is a violation of our medication agreement, your opioid analgesics (pain medicine) may be permanently discontinued. (Last update: 07/09/2017) ____________________________________________________________________________________________  ____________________________________________________________________________________________  Preparing for Procedure with Sedation  Instructions: . Oral Intake: Do not eat or drink anything for at least 8 hours prior to your procedure. . Transportation: Public transportation is not allowed. Bring an adult driver. The driver must be physically present in our waiting room before any procedure can be started. [place]land Kitchen Physical Assistance: Bring an adult physically capable of assisting you, in the event you need help. This adult should keep you company at home for at least 6 hours after the procedure. . Blood Pressure Medicine: Take your blood pressure medicine with a sip  of water the morning of the  procedure. . Blood thinners:  . Diabetics on insulin: Notify the staff so that you can be scheduled 1st case in the morning. If your diabetes requires high dose insulin, take only  of your normal insulin dose the morning of the procedure and notify the staff that you have done so. . Preventing infections: Shower with an antibacterial soap the morning of your procedure. . Build-up your immune system: Take 1000 mg of Vitamin C with every meal (3 times a day) the day prior to your procedure. Marland Kitchen Antibiotics: Inform the staff if you have a condition or reason that requires you to take antibiotics before dental procedures. . Pregnancy: If you are pregnant, call and cancel the procedure. . Sickness: If you have a cold, fever, or any active infections, call and cancel the procedure. . Arrival: You must be in the facility at least 30 minutes prior to your scheduled procedure. . Children: Do not bring children with you. . Dress appropriately: Bring dark clothing that you would not mind if they get stained. . Valuables: Do not bring any jewelry or valuables.  Procedure appointments are reserved for interventional treatments only. [place]land Kitchen No Prescription Refills. . No medication changes will be discussed during procedure appointments. . No disability issues will be discussed.  Remember:  Regular Business hours are:  Monday to Thursday 8:00 AM to 4:00 PM  Provider's Schedule: Milinda Pointer, MD:  Procedure days: Tuesday and Thursday 7:30 AM to 4:00 PM  Gillis Santa, MD:  Procedure days: Monday and Wednesday 7:30 AM to 4:00 PM ____________________________________________________________________________________________   ____________________________________________________________________________________________  Pain Prevention Technique  Definition:   A technique used to minimize the effects of an activity known to cause inflammation or swelling, which in turn leads to an increase in  pain.  Purpose: To prevent swelling from occurring. It is based on the fact that it is easier to prevent swelling from happening than it is to get rid of it, once it occurs.  Contraindications: 1. Anyone with allergy or hypersensitivity to the recommended medications. 2. Anyone taking anticoagulants (Blood Thinners) (e.g., Coumadin, Warfarin, Plavix, etc.). 3. Patients in Renal Failure.  Technique: Before you undertake an activity known to cause pain, or a flare-up of your chronic pain, and before you experience any pain, do the following:  1. On a full stomach, take 4 (four) over the counter Ibuprofens 264m tablets (Motrin), for a total of 800 mg. 2. In addition, take over the counter Magnesium 400 to 500 mg, before doing the activity.  3. Six (6) hours later, again on a full stomach, repeat the Ibuprofen. 4. That night, take a warm shower and stretch under the running warm water.  This technique may be sufficient to abort the pain and discomfort before it happens. Keep in mind that it takes a lot less medication to prevent swelling than it takes to eliminate it once it occurs.  ____________________________________________________________________________________________

## 2017-07-13 NOTE — Progress Notes (Signed)
Patient's Name: Samuel Roberts  MRN: [id number]  Referring Provider: [name], MD  DOB: 01/17/1956  PCP: [name], MD  DOS: 07/13/2017  Note by: Gaspar Cola, MD  Service setting: Ambulatory outpatient  Specialty: Interventional Pain Management  Location: [facility] (AMB) Pain Management Facility    Patient type: Established   Primary Reason(s) for Visit: Encounter for evaluation before starting new chronic pain management plan of care (Level of risk: moderate) CC: Knee Pain (right); Back Pain (lower right); Neck Pain (left); and Shoulder Pain (left)  HPI  Samuel Roberts is a 62 y.o. year old, male patient, who comes today for a follow-up evaluation to review the test results and decide on a treatment plan. He has Angina pectoris ([place]); UC (ulcerative colitis) ([place]); BPH (benign prostatic hyperplasia); Carcinoma in situ of prostate; Elevated prostate specific antigen (PSA); Family history of malignant neoplasm of prostate; Arthropathy of knee (Right); Enlarged prostate with lower urinary tract symptoms (LUTS); Chronic neck pain  (Primary Area of Pain) (Bilateral) (L>R); Occipital neuralgia (Tertiary Area of Pain) (Left); Chronic shoulder pain (Secondary Area of Pain) (Left); Chronic low back pain (Fourth Area of Pain) (Right) without sciatica; Chronic knee pain (Fifth Area of Pain) (Bilateral) (R>L); Chronic pain syndrome; Long term current use of opiate analgesic; Pharmacologic therapy; Disorder of skeletal system; Problems influencing health status; Pain of left humerus; Chronic upper extremity pain (Left); DDD (degenerative disc disease), cervical; Cervical spondylosis; 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 His primarily concern today is the Knee Pain (right); Back Pain (lower right); Neck Pain (left); and Shoulder Pain (left)  Pain Assessment: Location: Right Knee(see visit info for all pain sites) Radiating: neck pain goes into  shoulder and into upper arm just recently  Onset: More than a month ago Duration: Chronic pain Quality: Constant, Discomfort, Dull, Radiating, Sharp, Sore Severity: 8 /10 (self-reported pain score)  Note: Reported level is compatible with observation.                         When using our objective Pain Scale, levels between 6 and 10/10 are said to belong in an emergency room, as it progressively worsens from a 6/10, described as severely limiting, requiring emergency care not usually available at an outpatient pain management facility. At a 6/10 level, communication becomes difficult and requires great effort. Assistance to reach the emergency department may be required. Facial flushing and profuse sweating along with potentially dangerous increases in heart rate and blood pressure will be evident. Effect on ADL: working full time.  difficulty upon rising with increased pain Timing: Constant Modifying factors: has had several injections to the knee which helped.  injections to the lower back 1st helped, the 2nd did not help, done approx 1 year ago.    Samuel Roberts comes in today for a follow-up visit after his initial evaluation on 06/24/2017. Today we went over the results of his tests. These were explained in "Layman's terms". During today's appointment we went over my diagnostic impression, as well as the proposed treatment plan.  According to the patient's primary area of pain is in his neck. He admits that 6 years ago he fell off a loading dock 6 feet and fracture his left humerus.  He admits that he has been having left sided neck and shoulder pain since the fall. He denies any previous surgeries, interventional therapy, physical therapy or recent images. Cracked his left shoulder blade.  His second area of pain is in his left shoulder. He denies any surgery just casting after the fall.  He is left-hand dominant. He denies any upper extremity numbness tingling or weakness. He denies any previous  physical therapy or recent images.  His third area of pain is in his lower back. He admits that his right side only. He denies any pain going down the legs. He denies any previous surgery. He admits that he has had sacroiliac joint injection by Dr. Lubertha Sayres greater than 1 year ago. He admits that he has been more than a chiropractor for adjustments and has been effective. He denies any recent physical therapy. He admits that he did have a recent x-ray.  His fourth area of pain is in his knees. He admits that the right is greater than the left. He is s/p surgery to his right knee. He states first surgery was in 1980 and he had 2 additional arthroscopic surgeries by Dr. Sabra Heck with [facility]. He admits that he did have a cortisone injection to the right knee recently which is effective. He admits that he has had injection to his left knee within the past year which was also effective. He has had recent images  His fifth area of pain is headaches. He admits that he does have a headache most days. He admits that the majority are in the back of his head. He contributes that to daily working. He denies any previous interventional therapy or specific treatment.  In considering the treatment plan options, Samuel Roberts was reminded that I no longer take patients for medication management only. I asked him to let me know if he had no intention of taking advantage of the interventional therapies, so that we could make arrangements to provide this space to someone interested. I also made it clear that undergoing interventional therapies for the purpose of getting pain medications is very inappropriate on the part of a patient, and it will not be tolerated in this practice. This type of behavior would suggest true addiction and therefore it requires referral to an addiction specialist.   Further details on both, my assessment(s), as well as the proposed treatment plan, please see below.  Controlled Substance  Pharmacotherapy Assessment REMS (Risk Evaluation and Mitigation Strategy)  Analgesic: Tramadol 50 mg BID. (100 mg/day). (2/day) Highest recorded MME/day: 61m/day MME/day: 10 mg/day Pill Count: None expected due to no prior prescriptions written by our practice. No notes on file  Pharmacokinetics: Liberation and absorption (onset of action): WNL Distribution (time to peak effect): WNL Metabolism and excretion (duration of action): WNL         Pharmacodynamics: Desired effects: Analgesia: Mr. NSavantreports >50% benefit. Functional ability: Patient reports that medication allows him to accomplish basic ADLs Clinically meaningful improvement in function (CMIF): Sustained CMIF goals met Perceived effectiveness: Described as relatively effective, allowing for increase in activities of daily living (ADL) Undesirable effects: Side-effects or Adverse reactions: None reported Monitoring: [place] PMP: Online review of the past 154-montheriod previously conducted. Not applicable at this point since we have not taken over the patient's medication management yet. List of all UDS test(s) done:  Lab Results  Component Value Date   SUMMARY FINAL 06/24/2017   Last UDS on record: Summary  Date Value Ref Range Status  06/24/2017 FINAL  Final    Comment:    ==================================================================== TOXAS[facility]RE COMP DRUG ANALYSIS,UR ==================================================================== Test  Result       Flag       Units Drug Present and Declared for Prescription Verification   Tramadol                       >2083        EXPECTED   ng/mg creat   O-Desmethyltramadol            >2083        EXPECTED   ng/mg creat   N-Desmethyltramadol            1305         EXPECTED   ng/mg creat    Source of tramadol is a prescription medication.    O-desmethyltramadol and N-desmethyltramadol are expected    metabolites of tramadol. Drug Present not  Declared for Prescription Verification   Acetaminophen                  PRESENT      UNEXPECTED   Lidocaine                      PRESENT      UNEXPECTED Drug Absent but Declared for Prescription Verification   Metoprolol                     Not Detected UNEXPECTED ==================================================================== Test                      Result    Flag   Units      Ref Range   Creatinine              240              mg/dL      >=20 ==================================================================== Declared Medications:  The flagging and interpretation on this report are based on the  following declared medications.  Unexpected results may arise from  inaccuracies in the declared medications.  **Note: The testing scope of this panel includes these medications:  Metoprolol (Lopressor)  Tramadol (Ultram)  **Note: The testing scope of this panel does not include following  reported medications:  Pantoprazole (Protonix) ==================================================================== For clinical consultation, please call [phone]. ====================================================================    UDS interpretation: No unexpected findings.          Medication Assessment Form: Patient introduced to form today Treatment compliance: Treatment may start today if patient agrees with proposed plan. Evaluation of compliance is not applicable at this point Risk Assessment Profile: Aberrant behavior: See initial evaluations. None observed or detected today Comorbid factors increasing risk of overdose: See initial evaluation. No additional risks detected today Medical Psychology Evaluation: Please see scanned results in medical record. Opioid Risk Tool - 06/24/17 0829      Family History of Substance Abuse   Alcohol  Positive Male    Illegal Drugs  Positive Male    Rx Drugs  Positive Male or Male      Personal History of Substance Abuse   Alcohol   Negative    Illegal Drugs  Negative    Rx Drugs  Negative      Psychological Disease   Psychological Disease  Negative    Depression  Negative      Total Score   Opioid Risk Tool Scoring  10    Opioid Risk Interpretation  High Risk      ORT Scoring interpretation table:  Score <3 = Low Risk for  SUD  Score between 4-7 = Moderate Risk for SUD  Score >8 = High Risk for Opioid Abuse   Risk Mitigation Strategies:  Patient opioid safety counseling: Completed today. Counseling provided to patient as per "Patient Counseling Document". Document signed by patient, attesting to counseling and understanding Patient-Prescriber Agreement (PPA): Obtained today.  Controlled substance notification to other providers: Written and sent today.  Pharmacologic Plan: Today we may be taking over the patient's pharmacological regimen. See below.             Laboratory Chemistry  Inflammation Markers (CRP: Acute Phase) (ESR: Chronic Phase) Lab Results  Component Value Date   CRP 2.8 06/24/2017   ESRSEDRATE 11 06/24/2017                        Renal Function Markers Lab Results  Component Value Date   BUN 17 09/06/2016   CREATININE 0.73 09/06/2016   GFRAA >60 09/06/2016   GFRNONAA >60 09/06/2016                 Hepatic Function Markers Lab Results  Component Value Date   AST 27 09/05/2016   ALT 15 (L) 09/05/2016   ALBUMIN 4.1 09/05/2016   ALKPHOS 62 09/05/2016                 Electrolytes Lab Results  Component Value Date   NA 138 09/06/2016   K 5.1 09/06/2016   CL 103 09/06/2016   CALCIUM 9.3 09/06/2016   MG 2.4 (H) 06/24/2017                        Bone Pathology Markers Lab Results  Component Value Date   25OHVITD1 37 06/24/2017   25OHVITD2 2.2 06/24/2017   25OHVITD3 35 06/24/2017                         Coagulation Parameters Lab Results  Component Value Date   PLT 306 09/06/2016                 Cardiovascular Markers Lab Results  Component Value Date    TROPONINI <0.03 09/06/2016   HGB 14.1 09/06/2016   HCT 40.7 09/06/2016                 Note: Lab results reviewed.  Recent Diagnostic Imaging Review  Cervical Imaging: Cervical DG complete:  Results for orders placed during the hospital encounter of 06/24/17  DG Cervical Spine Complete   Narrative CLINICAL DATA:  Occipital neuralgia left side.  Chronic neck pain  EXAM: CERVICAL SPINE - COMPLETE 4+ VIEW  COMPARISON:  CT angio head and neck 12/09/2015  FINDINGS: Normal alignment.  No fracture or mass.  Disc degeneration and spurring C3-4 and C4-5. Mild foraminal narrowing bilaterally C3-4 and C4-5 due to spurring. Remaining foramen patent. Soft tissues negative  IMPRESSION: Disc degeneration and spondylosis C3-4 and C4-5 with mild foraminal narrowing bilaterally.   Electronically Signed   By: Franchot Gallo M.D.   On: 06/24/2017 15:09    Shoulder Imaging: Shoulder-L DG:  Results for orders placed during the hospital encounter of 06/24/17  DG Shoulder Left   Narrative CLINICAL DATA:  Chronic left shoulder pain  EXAM: LEFT SHOULDER - 2+ VIEW  COMPARISON:  None.  FINDINGS: There is no evidence of fracture or dislocation. There is no evidence of arthropathy or other focal bone abnormality. Soft tissues are unremarkable.  IMPRESSION: Negative.   Electronically Signed   By: Franchot Gallo M.D.   On: 06/24/2017 15:10    Complexity Note: Imaging results reviewed. Results shared with Mr. Woody, using Layman's terms.                         Meds   Current Outpatient Medications:  .  pantoprazole (PROTONIX) 40 MG tablet, Take 40 mg by mouth every other day. , Disp: , Rfl: 0 .  traMADol (ULTRAM) 50 MG tablet, Take 1 tablet (50 mg total) by mouth 2 (two) times daily., Disp: 60 tablet, Rfl: 0  ROS  Constitutional: Denies any fever or chills Gastrointestinal: No reported hemesis, hematochezia, vomiting, or acute GI distress Musculoskeletal: Denies any acute  onset joint swelling, redness, loss of ROM, or weakness Neurological: No reported episodes of acute onset apraxia, aphasia, dysarthria, agnosia, amnesia, paralysis, loss of coordination, or loss of consciousness  Allergies  Mr. Mccolgan has No Known Allergies.  [place]  Drug: Mr. Zacharia  reports that he does not use drugs. Alcohol:  reports that he does not drink alcohol. Tobacco:  reports that he has never smoked. He has never used smokeless tobacco. Medical:  has a past medical history of BPH (benign prostatic hyperplasia) and Colitis, ulcerative ([place]). Surgical: Mr. Hausen  has a past surgical history that includes Knee arthroscopy and arthrotomy; Tonsillectomy; LEFT HEART CATH AND CORONARY ANGIOGRAPHY (Left, 09/09/2016); and Joint replacement (Right). Family: family history includes Cancer in his father; Cirrhosis in his brother; Diabetes in his mother, sister, and sister; Drug abuse in his brother and brother; Heart disease in his brother and brother; Heart failure in his father and mother.  Constitutional Exam  General appearance: Well nourished, well developed, and well hydrated. In no apparent acute distress Vitals:   07/13/17 0835  BP: 135/81  Pulse: 71  Resp: 16  Temp: 97.9 F (36.6 C)  TempSrc: Oral  SpO2: 98%  Weight: 200 lb (90.7 kg)  Height: 5' 10"  (1.778 m)   BMI Assessment: Estimated body mass index is 28.7 kg/m as calculated from the following:   Height as of this encounter: 5' 10"  (1.778 m).   Weight as of this encounter: 200 lb (90.7 kg).  BMI interpretation table: BMI level Category Range association with higher incidence of chronic pain  <18 kg/m2 Underweight   18.5-24.9 kg/m2 Ideal body weight   25-29.9 kg/m2 Overweight Increased incidence by 20%  30-34.9 kg/m2 Obese (Class I) Increased incidence by 68%  35-39.9 kg/m2 Severe obesity (Class II) Increased incidence by 136%  >40 kg/m2 Extreme obesity (Class III) Increased incidence by 254%   BMI Readings from  Last 4 Encounters:  07/13/17 28.70 kg/m  06/24/17 31.01 kg/m  09/09/16 29.56 kg/m  09/06/16 29.64 kg/m   Wt Readings from Last 4 Encounters:  07/13/17 200 lb (90.7 kg)  06/24/17 210 lb (95.3 kg)  09/09/16 206 lb (93.4 kg)  09/06/16 206 lb 9.6 oz (93.7 kg)  Psych/Mental status: Alert, oriented x 3 (person, place, & time)       Eyes: PERLA Respiratory: No evidence of acute respiratory distress  Cervical Spine Area Exam  Skin & Axial Inspection: No masses, redness, edema, swelling, or associated skin lesions Alignment: Symmetrical Functional ROM: Decreased ROM      Stability: No instability detected Muscle Tone/Strength: Functionally intact. No obvious neuro-muscular anomalies detected. Sensory (Neurological): Movement-associated pain Palpation: No palpable anomalies  Upper Extremity (UE) Exam    Side: Right upper extremity  Side: Left upper extremity  Skin & Extremity Inspection: Skin color, temperature, and hair growth are WNL. No peripheral edema or cyanosis. No masses, redness, swelling, asymmetry, or associated skin lesions. No contractures.  Skin & Extremity Inspection: Skin color, temperature, and hair growth are WNL. No peripheral edema or cyanosis. No masses, redness, swelling, asymmetry, or associated skin lesions. No contractures.  Functional ROM: Unrestricted ROM          Functional ROM: Unrestricted ROM          Muscle Tone/Strength: Functionally intact. No obvious neuro-muscular anomalies detected.  Muscle Tone/Strength: Functionally intact. No obvious neuro-muscular anomalies detected.  Sensory (Neurological): Unimpaired          Sensory (Neurological): Unimpaired          Palpation: No palpable anomalies              Palpation: No palpable anomalies              Specialized Test(s): Deferred         Specialized Test(s): Deferred          Thoracic Spine Area Exam  Skin & Axial Inspection: No masses, redness, or swelling Alignment: Symmetrical Functional  ROM: Unrestricted ROM Stability: No instability detected Muscle Tone/Strength: Functionally intact. No obvious neuro-muscular anomalies detected. Sensory (Neurological): Unimpaired Muscle strength & Tone: No palpable anomalies  Lumbar Spine Area Exam  Skin & Axial Inspection: No masses, redness, or swelling Alignment: Symmetrical Functional ROM: Decreased ROM      Stability: No instability detected Muscle Tone/Strength: Functionally intact. No obvious neuro-muscular anomalies detected. Sensory (Neurological): Unimpaired Palpation: No palpable anomalies       Provocative Tests: Lumbar Hyperextension and rotation test: evaluation deferred today       Lumbar Lateral bending test: evaluation deferred today       Patrick's Maneuver: evaluation deferred today                    Gait & Posture Assessment  Ambulation: Limited Gait: Antalgic Posture: Difficulty standing up straight, due to pain   Lower Extremity Exam    Side: Right lower extremity  Side: Left lower extremity  Skin & Extremity Inspection: Skin color, temperature, and hair growth are WNL. No peripheral edema or cyanosis. No masses, redness, swelling, asymmetry, or associated skin lesions. No contractures.  Skin & Extremity Inspection: Skin color, temperature, and hair growth are WNL. No peripheral edema or cyanosis. No masses, redness, swelling, asymmetry, or associated skin lesions. No contractures.  Functional ROM: Unrestricted ROM          Functional ROM: Unrestricted ROM          Muscle Tone/Strength: Functionally intact. No obvious neuro-muscular anomalies detected.  Muscle Tone/Strength: Functionally intact. No obvious neuro-muscular anomalies detected.  Sensory (Neurological): Unimpaired  Sensory (Neurological): Unimpaired  Palpation: No palpable anomalies  Palpation: No palpable anomalies   Assessment & Plan  Primary Diagnosis & Pertinent Problem List: The primary encounter diagnosis was Chronic pain syndrome.  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  Visit Diagnosis: 1. Chronic pain syndrome   2. Chronic neck pain  (Primary Area of Pain) (Bilateral) (L>R)   3. Chronic shoulder pain (Secondary Area of Pain) (Left)   4. Occipital neuralgia (Tertiary Area of Pain) (Left)   5. Chronic low back pain (Fourth Area of Pain) (Right) without sciatica   6. Cervicalgia   7. DDD (degenerative disc disease), cervical   8. Cervical spondylosis   9. 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 Chronic upper extremity pain (Left)   11. Chronic knee pain (Fifth Area of Pain) (Bilateral) (R>L)   12. Arthropathy of knee (Right)   13. Disorder of skeletal system   14. Problems influencing health status   15. Pharmacologic therapy   16. Long term current use of opiate analgesic    Problems updated and reviewed during this visit: Problem  Chronic upper extremity pain (Left)  Ddd (Degenerative Disc Disease), Cervical  Cervical spondylosis   Disc degeneration and spondylosis C3-4 and C4-5 with mild foraminal narrowing bilaterally.   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)  Cervicalgia  Chronic neck pain  (Primary Area of Pain) (Bilateral) (L>R)  Occipital neuralgia (Tertiary Area of Pain) (Left)  Chronic shoulder pain (Secondary Area of Pain) (Left)  Chronic low  back pain (Fourth Area of Pain) (Right) without sciatica  Chronic knee pain (Fifth Area of Pain) (Bilateral) (R>L)  Chronic Pain Syndrome  Pain of Left Humerus  Arthropathy of knee (Right)  Carcinoma in Situ of Prostate  Long Term Current Use of Opiate Analgesic  Pharmacologic Therapy  Disorder of Skeletal System  Problems Influencing Health Status  Angina Pectoris ([facility])  Uc (Ulcerative Colitis) ([facility])  Bph (Benign Prostatic Hyperplasia)  Elevated Prostate Specific Antigen (Psa)  Family History of Malignant Neoplasm of Prostate  Enlarged Prostate With Lower Urinary Tract Symptoms (Luts)   Time Note: Greater than 50% of the 40 minute(s) of face-to-face time spent with Mr. Bartosiewicz, was spent in counseling/coordination of care regarding: the appropriate use of the pain scale, Mr. Aguiniga's primary cause of pain, the results of his recent test(s), the significance of each one oth the test(s) anomalies and it's corresponding characteristic pain pattern(s), the treatment plan, treatment alternatives, realistic expectations, the goals of pain management (increased in functionality), the medication agreement, the importance of providing [place] with accurate post-procedure information, the patient's responsibilities when it comes to controlled substances and the need to collect and read the AVS material.  Plan of Care  Pharmacotherapy (Medications Ordered): Meds ordered this encounter  Medications  . traMADol (ULTRAM) 50 MG tablet    Sig: Take 1 tablet (50 mg total) by mouth 2 (two) times daily.    Dispense:  60 tablet    Refill:  0    Fill one day early if pharmacy is closed on scheduled refill date. Do not fill until: 07/13/17 To last until: 08/12/17    Procedure Orders     Cervical Epidural Injection Lab Orders  No laboratory test(s) ordered today    Imaging Orders     DG Lumbar Spine Complete W/Bend     DG Knee 1-2 Views Right     DG Knee 1-2 Views Left Referral Orders  No referral(s)  requested today    Pharmacological management options:  Opioid Analgesics: We'll take over management today. See above orders Membrane stabilizer: We  have discussed the possibility of optimizing this mode of therapy, if tolerated Muscle relaxant: We have discussed the possibility of a trial NSAID: We have discussed the possibility of a trial Other analgesic(s): To be determined at a later time   Interventional management options: Planned, scheduled, and/or pending:    Diagnostic left-sided cervical epidural steroid injection #1 under fluoroscopic guidance   Considering:   Diagnostic left-sided cervical facet nerve block Possible left-sided cervical facet RFA Diagnostic left suprascapular nerve block Diagnostic trigger point injection Diagnostic right lumbar facet nerve block Diagnostic right lumbar facet RFA Diagnostic Left knee Hyalgan series Diagnostic Right knee genicular nerve block Possible right knee genicular RFA    PRN Procedures:   None at this time   Provider-requested follow-up: Return for Procedure (w/ sedation): (L) CESI #1.  No future appointments.  Primary Care Physician: [name], MD Location: [facility] Out[facility] Facility Note by: Gaspar Cola, MD Date: 07/13/2017; Time: 1:49 PM

## 2017-07-14 ENCOUNTER — Ambulatory Visit (HOSPITAL_BASED_OUTPATIENT_CLINIC_OR_DEPARTMENT_OTHER): Payer: 59 | Admitting: Pain Medicine

## 2017-07-14 ENCOUNTER — Ambulatory Visit
Admission: RE | Admit: 2017-07-14 | Discharge: 2017-07-14 | Disposition: A | Discharge status: Home / Self Care | Payer: 59 | Source: Ambulatory Visit | Attending: Pain Medicine | Admitting: Pain Medicine

## 2017-07-14 ENCOUNTER — Other Ambulatory Visit: Payer: Self-pay

## 2017-07-14 ENCOUNTER — Encounter: Payer: Self-pay | Admitting: Pain Medicine

## 2017-07-14 VITALS — BP 143/94 | HR 74 | Temp 98.2°F | Resp 19 | Ht 70.0 in | Wt 200.0 lb

## 2017-07-14 DIAGNOSIS — M4802 Spinal stenosis, cervical region: Secondary | ICD-10-CM | POA: Diagnosis not present

## 2017-07-14 DIAGNOSIS — M47812 Spondylosis without myelopathy or radiculopathy, cervical region: Secondary | ICD-10-CM | POA: Insufficient documentation

## 2017-07-14 DIAGNOSIS — M542 Cervicalgia: Secondary | ICD-10-CM

## 2017-07-14 DIAGNOSIS — M5030 Other cervical disc degeneration, unspecified cervical region: Secondary | ICD-10-CM

## 2017-07-14 DIAGNOSIS — G8929 Other chronic pain: Secondary | ICD-10-CM | POA: Diagnosis not present

## 2017-07-14 MED ORDER — ROPIVACAINE HCL 2 MG/ML IJ SOLN
INTRAMUSCULAR | Status: AC
  Filled 2017-07-14: qty 10

## 2017-07-14 MED ORDER — LIDOCAINE HCL 2 % IJ SOLN
INTRAMUSCULAR | Status: AC
  Filled 2017-07-14: qty 20

## 2017-07-14 MED ORDER — SODIUM CHLORIDE 0.9% FLUSH
1.0000 mL | Freq: Once | INTRAVENOUS | Status: AC
  Administered 2017-07-14: 10 mL

## 2017-07-14 MED ORDER — ROPIVACAINE HCL 2 MG/ML IJ SOLN
1.0000 mL | Freq: Once | INTRAMUSCULAR | Status: AC
  Administered 2017-07-14: 10 mL via EPIDURAL

## 2017-07-14 MED ORDER — DEXAMETHASONE SODIUM PHOSPHATE 10 MG/ML IJ SOLN
INTRAMUSCULAR | Status: AC
  Filled 2017-07-14: qty 1

## 2017-07-14 MED ORDER — LIDOCAINE HCL 2 % IJ SOLN
20.0000 mL | Freq: Once | INTRAMUSCULAR | Status: AC
  Administered 2017-07-14: 400 mg

## 2017-07-14 MED ORDER — IOPAMIDOL (ISOVUE-M 200) INJECTION 41%
10.0000 mL | Freq: Once | INTRAMUSCULAR | Status: AC
  Administered 2017-07-14: 10 mL via EPIDURAL

## 2017-07-14 MED ORDER — SODIUM CHLORIDE 0.9 % IJ SOLN
INTRAMUSCULAR | Status: AC
  Filled 2017-07-14: qty 10

## 2017-07-14 MED ORDER — DEXAMETHASONE SODIUM PHOSPHATE 10 MG/ML IJ SOLN
10.0000 mg | Freq: Once | INTRAMUSCULAR | Status: AC
  Administered 2017-07-14: 10 mg

## 2017-07-14 MED ORDER — IOPAMIDOL (ISOVUE-M 200) INJECTION 41%
INTRAMUSCULAR | Status: AC
  Filled 2017-07-14: qty 10

## 2017-07-14 NOTE — Progress Notes (Signed)
Safety precautions to be maintained throughout the outpatient stay will include: orient to surroundings, keep bed in low position, maintain call bell within reach at all times, provide assistance with transfer out of bed and ambulation.  

## 2017-07-14 NOTE — Patient Instructions (Signed)

## 2017-07-14 NOTE — Progress Notes (Signed)
Patient's Name: Samuel Roberts  MRN: [id number]  Referring Provider: Milinda Pointer, MD  DOB: 1956/03/02  PCP: [name], MD  DOS: 07/14/2017  Note by: Gaspar Cola, MD  Service setting: Ambulatory outpatient  Specialty: Interventional Pain Management  Patient type: Established  Location: [facility] (AMB) Pain Management Facility  Visit type: Interventional Procedure   Primary Reason for Visit: Interventional Pain Management Treatment. CC: Neck Pain (left)  Procedure:       Anesthesia, Analgesia, Anxiolysis:  Type: Diagnostic, Inter-Laminar, Epidural Steroid Injection #1  Region: Posterior Cervico-thoracic Region Level: C7-T1 Laterality: Left-Sided Paramedial  Type: Local Anesthesia Indication(s): Analgesia         Route: Infiltration ([place]/IM) IV Access: Declined Sedation: Declined  Local Anesthetic: Lidocaine 1-2%   Indications: 1. DDD (degenerative disc disease), cervical   2. Chronic neck pain  (Primary Area of Pain) (Bilateral) (L>R)   3. 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 Cervical spondylosis    Pain Score: Pre-procedure: 7 /10 Post-procedure: 0-No pain/10  Pre-op Assessment:  Samuel Roberts is a 62 y.o. (year old), male patient, seen today for interventional treatment. He  has a past surgical history that includes Knee arthroscopy and arthrotomy; Tonsillectomy; LEFT HEART CATH AND CORONARY ANGIOGRAPHY (Left, 09/09/2016); and Joint replacement (Right). Samuel Roberts has a current medication list which includes the following prescription(s): pantoprazole and tramadol. His primarily concern today is the Neck Pain (left)  Initial Vital Signs:  Pulse Rate: 74 Temp: 98.2 F (36.8 C) Resp: 16 BP: 131/89 SpO2: 99 %  BMI: Estimated body mass index is 28.7 kg/m as calculated from the following:   Height as of this encounter: 5' 10"  (1.778 m).   Weight as of this encounter: 200 lb (90.7 kg).  Risk Assessment: Allergies: Reviewed. He has No Known  Allergies.  Allergy Precautions: None required Coagulopathies: Reviewed. None identified.  Blood-thinner therapy: None at this time Active Infection(s): Reviewed. None identified. Samuel Roberts is afebrile  Site Confirmation: Samuel Roberts was asked to confirm the procedure and laterality before marking the site Procedure checklist: Completed Consent: Before the procedure and under the influence of no sedative(s), amnesic(s), or anxiolytics, the patient was informed of the treatment options, risks and possible complications. To fulfill our ethical and legal obligations, as recommended by the American Medical Association's Code of Ethics, I have informed the patient of my clinical impression; the nature and purpose of the treatment or procedure; the risks, benefits, and possible complications of the intervention; the alternatives, including doing nothing; the risk(s) and benefit(s) of the alternative treatment(s) or procedure(s); and the risk(s) and benefit(s) of doing nothing. The patient was provided information about the general risks and possible complications associated with the procedure. These may include, but are not limited to: failure to achieve desired goals, infection, bleeding, organ or nerve damage, allergic reactions, paralysis, and death. In addition, the patient was informed of those risks and complications associated to Spine-related procedures, such as failure to decrease pain; infection (i.e.: Meningitis, epidural or intraspinal abscess); bleeding (i.e.: epidural hematoma, subarachnoid hemorrhage, or any other type of intraspinal or peri-dural bleeding); organ or nerve damage (i.e.: Any type of peripheral nerve, nerve root, or spinal cord injury) with subsequent damage to sensory, motor, and/or autonomic systems, resulting in permanent pain, numbness, and/or weakness of one or several areas of the body; allergic reactions; (i.e.: anaphylactic reaction); and/or death. Furthermore, the patient  was informed of those risks and complications associated with the medications. These include, but are not  limited to: allergic reactions (i.e.: anaphylactic or anaphylactoid reaction(s)); adrenal axis suppression; blood sugar elevation that in diabetics may result in ketoacidosis or comma; water retention that in patients with history of congestive heart failure may result in shortness of breath, pulmonary edema, and decompensation with resultant heart failure; weight gain; swelling or edema; medication-induced neural toxicity; particulate matter embolism and blood vessel occlusion with resultant organ, and/or nervous system infarction; and/or aseptic necrosis of one or more joints. Finally, the patient was informed that Medicine is not an exact science; therefore, there is also the possibility of unforeseen or unpredictable risks and/or possible complications that may result in a catastrophic outcome. The patient indicated having understood very clearly. We have given the patient no guarantees and we have made no promises. Enough time was given to the patient to ask questions, all of which were answered to the patient's satisfaction. Samuel Roberts has indicated that he wanted to continue with the procedure. Attestation: I, the ordering provider, attest that I have discussed with the patient the benefits, risks, side-effects, alternatives, likelihood of achieving goals, and potential problems during recovery for the procedure that I have provided informed consent. Date  Time: 07/14/2017  1:56 PM  Pre-Procedure Preparation:  Monitoring: As per clinic protocol. Respiration, ETCO2, SpO2, BP, heart rate and rhythm monitor placed and checked for adequate function Safety Precautions: Patient was assessed for positional comfort and pressure points before starting the procedure. Time-out: I initiated and conducted the "Time-out" before starting the procedure, as per protocol. The patient was asked to participate by  confirming the accuracy of the "Time Out" information. Verification of the correct person, site, and procedure were performed and confirmed by me, the nursing staff, and the patient. "Time-out" conducted as per Joint Commission's Universal Protocol (UP.01.01.01). Time: 1440  Description of Procedure Process:   Position: Prone with head of the table was raised to facilitate breathing. Target Area: For Epidural Steroid injections the target is the interlaminar space, initially targeting the lower border of the superior vertebral body lamina. Approach: Paramedial approach. Area Prepped: Entire PosteriorCervical Region Prepping solution: ChloraPrep (2% chlorhexidine gluconate and 70% isopropyl alcohol) Safety Precautions: Aspiration looking for blood return was conducted prior to all injections. At no point did we inject any substances, as a needle was being advanced. No attempts were made at seeking any paresthesias. Safe injection practices and needle disposal techniques used. Medications properly checked for expiration dates. SDV (single dose vial) medications used. Description of the Procedure: Protocol guidelines were followed. The procedure needle was introduced through the skin, ipsilateral to the reported pain, and advanced to the target area. Bone was contacted and the needle walked caudad, until the lamina was cleared. The epidural space was identified using "loss-of-resistance technique" with 2-3 ml of PF-NaCl (0.9% NSS), in a 5cc LOR glass syringe. Vitals:   07/14/17 1433 07/14/17 1438 07/14/17 1443 07/14/17 1446  BP: (!) 128/100 (!) 126/101 (!) 127/97 (!) 143/94  Pulse:      Resp: 17 18 19 19   Temp:      TempSrc:      SpO2: 96% 95% 96% 96%  Weight:      Height:        Start Time: 1440 hrs. End Time: 1445(5.5 cm from skin C7-T1 left) hrs. Materials:  Needle(s) Type: Epidural needle Gauge: 17G Length: 3.5-in Medication(s): Please see orders for medications and dosing  details.  Imaging Guidance (Spinal):  Type of Imaging Technique: Fluoroscopy Guidance (Spinal) Indication(s): Assistance in needle guidance and placement  for procedures requiring needle placement in or near specific anatomical locations not easily accessible without such assistance. Exposure Time: Please see nurses notes. Contrast: Before injecting any contrast, we confirmed that the patient did not have an allergy to iodine, shellfish, or radiological contrast. Once satisfactory needle placement was completed at the desired level, radiological contrast was injected. Contrast injected under live fluoroscopy. No contrast complications. See chart for type and volume of contrast used. Fluoroscopic Guidance: I was personally present during the use of fluoroscopy. "Tunnel Vision Technique" used to obtain the best possible view of the target area. Parallax error corrected before commencing the procedure. "Direction-depth-direction" technique used to introduce the needle under continuous pulsed fluoroscopy. Once target was reached, antero-posterior, oblique, and lateral fluoroscopic projection used confirm needle placement in all planes. Images permanently stored in EMR. Interpretation: I personally interpreted the imaging intraoperatively. Adequate needle placement confirmed in multiple planes. Appropriate spread of contrast into desired area was observed. No evidence of afferent or efferent intravascular uptake. No intrathecal or subarachnoid spread observed. Permanent images saved into the patient's record.  Antibiotic Prophylaxis:   Anti-infectives (From admission, onward)   None     Indication(s): None identified  Post-operative Assessment:  Post-procedure Vital Signs:  Pulse Rate: 74 Temp: 98.2 F (36.8 C) Resp: 19 BP: (!) 143/94 SpO2: 96 %  EBL: None  Complications: No immediate post-treatment complications observed by team, or reported by patient.  Note: The patient tolerated the  entire procedure well. A repeat set of vitals were taken after the procedure and the patient was kept under observation following institutional policy, for this type of procedure. Post-procedural neurological assessment was performed, showing return to baseline, prior to discharge. The patient was provided with post-procedure discharge instructions, including a section on how to identify potential problems. Should any problems arise concerning this procedure, the patient was given instructions to immediately contact us, at any time, without hesitation. In any case, we plan to contact the patient by telephone for a follow-up status report regarding this interventional procedure.  Comments:  No additional relevant information.  Plan of Care    Imaging Orders     DG C-Arm 1-60 Min-No Report Procedure Orders    No procedure(s) ordered today    Medications ordered for procedure: Meds ordered this encounter  Medications  . iopamidol (ISOVUE-M) 41 % intrathecal injection 10 mL    Must be Myelogram-compatible. If not available, you may substitute with a water-soluble, non-ionic, hypoallergenic, myelogram-compatible radiological contrast medium.  Marland Kitchen lidocaine (XYLOCAINE) 2 % (with pres) injection 400 mg  . sodium chloride flush (NS) 0.9 % injection 1 mL  . ropivacaine (PF) 2 mg/mL (0.2%) (NAROPIN) injection 1 mL  . dexamethasone (DECADRON) injection 10 mg   Medications administered: We administered iopamidol, lidocaine, sodium chloride flush, ropivacaine (PF) 2 mg/mL (0.2%), and dexamethasone.  See the medical record for exact dosing, route, and time of administration.  New Prescriptions   No medications on file   Disposition: Discharge home  Discharge Date & Time: 07/14/2017; 1447 hrs.   Physician-requested Follow-up: Return for post-procedure eval (2 wks), w/ Dr. Dossie Arbour.  Future Appointments  Date Time Provider [place]  08/05/2017  1:15 PM Milinda Pointer, MD [facility] None    Primary Care Physician: [name], MD Location: [facility] Outpatient Pain Management Facility Note by: Gaspar Cola, MD Date: 07/14/2017; Time: 3:38 PM  Disclaimer:  Medicine is not an Chief Strategy Officer. The only guarantee in medicine is that nothing is guaranteed. It is important to  note that the decision to proceed with this intervention was based on the information collected from the patient. The Data and conclusions were drawn from the patient's questionnaire, the interview, and the physical examination. Because the information was provided in large part by the patient, it cannot be guaranteed that it has not been purposely or unconsciously manipulated. Every effort has been made to obtain as much relevant data as possible for this evaluation. It is important to note that the conclusions that lead to this procedure are derived in large part from the available data. Always take into account that the treatment will also be dependent on availability of resources and existing treatment guidelines, considered by other Pain Management Practitioners as being common knowledge and practice, at the time of the intervention. For Medico-Legal purposes, it is also important to point out that variation in procedural techniques and pharmacological choices are the acceptable norm. The indications, contraindications, technique, and results of the above procedure should only be interpreted and judged by a Board-Certified Interventional Pain Specialist with extensive familiarity and expertise in the same exact procedure and technique.

## 2017-07-15 ENCOUNTER — Telehealth: Payer: Self-pay | Admitting: *Deleted

## 2017-07-15 NOTE — Telephone Encounter (Signed)
Denies complications post procedure. 

## 2017-07-17 ENCOUNTER — Telehealth: Payer: Self-pay | Admitting: Pain Medicine

## 2017-07-17 NOTE — Telephone Encounter (Signed)
PA sent and approved.  Wife notified.

## 2017-07-17 NOTE — Telephone Encounter (Signed)
Spoke with patient and states he believes he needs a PA for his Tramadol.  Will get it sent today.

## 2017-07-17 NOTE — Telephone Encounter (Signed)
Wife is calling about patient's medications ? Left 2 msgs and that is all she said. Please call to assist

## 2017-07-17 NOTE — Telephone Encounter (Signed)
Attempted to call patient but voicemail box was full.

## 2017-08-05 ENCOUNTER — Encounter: Payer: Self-pay | Admitting: Pain Medicine

## 2017-08-05 ENCOUNTER — Telehealth: Payer: Self-pay

## 2017-08-05 ENCOUNTER — Other Ambulatory Visit: Payer: Self-pay

## 2017-08-05 ENCOUNTER — Ambulatory Visit: Payer: 59 | Attending: Pain Medicine | Admitting: Pain Medicine

## 2017-08-05 VITALS — BP 130/90 | HR 73 | Temp 98.3°F | Resp 16 | Ht 70.0 in | Wt 200.0 lb

## 2017-08-05 DIAGNOSIS — G8929 Other chronic pain: Secondary | ICD-10-CM

## 2017-08-05 DIAGNOSIS — K5190 Ulcerative colitis, unspecified, without complications: Secondary | ICD-10-CM | POA: Diagnosis not present

## 2017-08-05 DIAGNOSIS — M170 Bilateral primary osteoarthritis of knee: Secondary | ICD-10-CM | POA: Diagnosis not present

## 2017-08-05 DIAGNOSIS — M5030 Other cervical disc degeneration, unspecified cervical region: Secondary | ICD-10-CM | POA: Diagnosis not present

## 2017-08-05 DIAGNOSIS — I209 Angina pectoris, unspecified: Secondary | ICD-10-CM | POA: Insufficient documentation

## 2017-08-05 DIAGNOSIS — M542 Cervicalgia: Secondary | ICD-10-CM | POA: Diagnosis not present

## 2017-08-05 DIAGNOSIS — M5481 Occipital neuralgia: Secondary | ICD-10-CM

## 2017-08-05 DIAGNOSIS — M25561 Pain in right knee: Secondary | ICD-10-CM | POA: Diagnosis present

## 2017-08-05 DIAGNOSIS — M1711 Unilateral primary osteoarthritis, right knee: Secondary | ICD-10-CM | POA: Insufficient documentation

## 2017-08-05 DIAGNOSIS — M47812 Spondylosis without myelopathy or radiculopathy, cervical region: Secondary | ICD-10-CM | POA: Insufficient documentation

## 2017-08-05 DIAGNOSIS — N401 Enlarged prostate with lower urinary tract symptoms: Secondary | ICD-10-CM | POA: Diagnosis not present

## 2017-08-05 DIAGNOSIS — M545 Low back pain, unspecified: Secondary | ICD-10-CM

## 2017-08-05 DIAGNOSIS — M25512 Pain in left shoulder: Secondary | ICD-10-CM

## 2017-08-05 DIAGNOSIS — M25562 Pain in left knee: Secondary | ICD-10-CM | POA: Diagnosis not present

## 2017-08-05 DIAGNOSIS — G894 Chronic pain syndrome: Secondary | ICD-10-CM | POA: Insufficient documentation

## 2017-08-05 DIAGNOSIS — Z79891 Long term (current) use of opiate analgesic: Secondary | ICD-10-CM | POA: Insufficient documentation

## 2017-08-05 DIAGNOSIS — M4802 Spinal stenosis, cervical region: Secondary | ICD-10-CM | POA: Insufficient documentation

## 2017-08-05 DIAGNOSIS — M79602 Pain in left arm: Secondary | ICD-10-CM | POA: Insufficient documentation

## 2017-08-05 DIAGNOSIS — N400 Enlarged prostate without lower urinary tract symptoms: Secondary | ICD-10-CM | POA: Diagnosis not present

## 2017-08-05 NOTE — Progress Notes (Signed)
Safety precautions to be maintained throughout the outpatient stay will include: orient to surroundings, keep bed in low position, maintain call bell within reach at all times, provide assistance with transfer out of bed and ambulation.  

## 2017-08-05 NOTE — Progress Notes (Signed)
Nursing Pain Medication Assessment:  Safety precautions to be maintained throughout the outpatient stay will include: orient to surroundings, keep bed in low position, maintain call bell within reach at all times, provide assistance with transfer out of bed and ambulation.  Medication Inspection Compliance: Pill count conducted under aseptic conditions, in front of the patient. Neither the pills nor the bottle was removed from the patient's sight at any time. Once count was completed pills were immediately returned to the patient in their original bottle.  Medication: Tramadol (Ultram) Pill/Patch Count: 26 of 60 pills remain Pill/Patch Appearance: Markings consistent with prescribed medication Bottle Appearance: Standard pharmacy container. Clearly labeled. Filled Date: 04 / 01/ 2019 Last Medication intake:  Today

## 2017-08-05 NOTE — Patient Instructions (Signed)
____________________________________________________________________________________________  Preparing for your procedure (without sedation)  Instructions: . Oral Intake: Do not eat or drink anything for at least 3 hours prior to your procedure. . Transportation: Unless otherwise stated by your physician, you may drive yourself after the procedure. . Blood Pressure Medicine: Take your blood pressure medicine with a sip of water the morning of the procedure. . Blood thinners:  . Diabetics on insulin: Notify the staff so that you can be scheduled 1st case in the morning. If your diabetes requires high dose insulin, take only  of your normal insulin dose the morning of the procedure and notify the staff that you have done so. . Preventing infections: Shower with an antibacterial soap the morning of your procedure.  . Build-up your immune system: Take 1000 mg of Vitamin C with every meal (3 times a day) the day prior to your procedure. Marland Kitchen Antibiotics: Inform the staff if you have a condition or reason that requires you to take antibiotics before dental procedures. . Pregnancy: If you are pregnant, call and cancel the procedure. . Sickness: If you have a cold, fever, or any active infections, call and cancel the procedure. . Arrival: You must be in the facility at least 30 minutes prior to your scheduled procedure. . Children: Do not bring any children with you. . Dress appropriately: Bring dark clothing that you would not mind if they get stained. . Valuables: Do not bring any jewelry or valuables.  Procedure appointments are reserved for interventional treatments only. Marland Kitchen No Prescription Refills. . No medication changes will be discussed during procedure appointments. . No disability issues will be discussed.  Remember:  Regular Business hours are:  Monday to Thursday 8:00 AM to 4:00 PM  Provider's Schedule: Milinda Pointer, MD:  Procedure days: Tuesday and Thursday 7:30 AM to 4:00  PM  Gillis Santa, MD:  Procedure days: Monday and Wednesday 7:30 AM to 4:00 PM ____________________________________________________________________________________________

## 2017-08-05 NOTE — Progress Notes (Signed)
Patient's Name: Samuel Roberts  MRN: [id number]  Referring Provider: [name], MD  DOB: Jun 08, 1955  PCP: [name], MD  DOS: 08/05/2017  Note by: Gaspar Cola, MD  Service setting: Ambulatory outpatient  Specialty: Interventional Pain Management  Location: [facility] (AMB) Pain Management Facility    Patient type: Established   Primary Reason(s) for Visit: Encounter for post-procedure evaluation of chronic illness with mild to moderate exacerbation CC: Shoulder Pain (left ); Back Pain (lower right ); and Knee Pain  HPI  Samuel Roberts is a 62 y.o. year old, male patient, who comes today for a post-procedure evaluation. He has Angina pectoris (); UC (ulcerative colitis) ([place]); BPH (benign prostatic hyperplasia); Carcinoma in situ of prostate; Elevated prostate specific antigen (PSA); Family history of malignant neoplasm of prostate; Arthropathy of knee (Right); Enlarged prostate with lower urinary tract symptoms (LUTS); Chronic neck pain  (Primary Area of Pain) (Bilateral) (L>R); Occipital neuralgia (Tertiary Area of Pain) (Left); Chronic shoulder pain (Secondary Area of Pain) (Left); Chronic low back pain (Fourth Area of Pain) (Right) without sciatica; Chronic knee pain (Fifth Area of Pain) (Bilateral) (R>L); Chronic pain syndrome; Long term current use of opiate analgesic; Pharmacologic therapy; Disorder of skeletal system; Problems influencing health status; Pain of left humerus; Chronic upper extremity pain (Left); DDD (degenerative disc disease), cervical; Cervical spondylosis; 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 His primarily concern today is the Shoulder Pain (left ); Back Pain (lower right ); and Knee Pain  Pain Assessment: Location: Left Shoulder Radiating: left shoulder; right lower back, right knee Onset: More than a month ago Duration:  Chronic pain Quality: Constant, Dull Severity: 3 /10 (self-reported pain score)  Note: Reported level is inconsistent with clinical observations. Clinically the patient looks like a 1/10 A 1/10 is viewed as "Mild" and described as nagging, annoying, but not interfering with basic activities of daily living (ADL). Samuel Roberts is able to eat, bathe, get dressed, do toileting (being able to get on and off the toilet and perform personal hygiene functions), transfer (move in and out of bed or a chair without assistance), and maintain continence (able to control bladder and bowel functions). Physiologic parameters such as blood pressure and heart rate apear wnl.       When using our objective Pain Scale, levels between 6 and 10/10 are said to belong in an emergency room, as it progressively worsens from a 6/10, described as severely limiting, requiring emergency care not usually available at an outpatient pain management facility. At a 6/10 level, communication becomes difficult and requires great effort. Assistance to reach the emergency department may be required. Facial flushing and profuse sweating along with potentially dangerous increases in heart rate and blood pressure will be evident. Effect on ADL: none  Timing: Constant Modifying factors: resting; heat  Samuel Roberts comes in today for post-procedure evaluation after the treatment done on 07/17/2017.  The patient indicates doing much better after the left cervical epidural steroid injection #1.  He no longer has pain with the neck in a neutral position.  He does continue to have some pain on the left side when he turns his neck either to the right or the left.  However, he was able to increase his level of activity to where he did a lot of work this past weekend and he blames all of that work for the reason why some of the pain is  coming back.  Today we have talked about this neck pain, the knee pain, and we will proceed with the other 2 cervical epidural  steroid injection see if we can improve this neck pain further.  Once we get done with this, then will look at treating the knee with some Hyalgan knee injections.  Today we reviewed the results of his knee x-rays.  I have shown the images to the patient on I explained to him in detail.  Further details on both, my assessment(s), as well as the proposed treatment plan, please see below.  Post-Procedure Assessment  07/17/2017 Procedure: Diagnostic left-sided cervical epidural steroid injection #1 under fluoroscopic guidance, no sedation Pre-procedure pain score:  7/10 Post-procedure pain score: 0/10 (100% relief) Influential Factors: BMI: 28.70 kg/m Intra-procedural challenges: None observed.         Assessment challenges: None detected.              Reported side-effects: None.        Post-procedural adverse reactions or complications: None reported         Sedation: No sedation used. When no sedatives are used, the analgesic levels obtained are directly associated to the effectiveness of the local anesthetics. However, when sedation is provided, the level of analgesia obtained during the initial 1 hour following the intervention, is believed to be the result of a combination of factors. These factors may include, but are not limited to: 1. The effectiveness of the local anesthetics used. 2. The effects of the analgesic(s) and/or anxiolytic(s) used. 3. The degree of discomfort experienced by the patient at the time of the procedure. 4. The patients ability and reliability in recalling and recording the events. 5. The presence and influence of possible secondary gains and/or psychosocial factors. Reported result: Relief experienced during the 1st hour after the procedure: 100 % (Ultra-Short Term Relief) Samuel Roberts has indicated area to have been numb during this time. Interpretative annotation: Clinically appropriate result. No IV Analgesic or Anxiolytic given, therefore benefits are completely  due to Local Anesthetic effects.          Effects of local anesthetic: The analgesic effects attained during this period are directly associated to the localized infiltration of local anesthetics and therefore cary significant diagnostic value as to the etiological location, or anatomical origin, of the pain. Expected duration of relief is directly dependent on the pharmacodynamics of the local anesthetic used. Long-acting (4-6 hours) anesthetics used.  Reported result: Relief during the next 4 to 6 hour after the procedure: 100 % (Short-Term Relief) Samuel Roberts has indicated area to have been numb during this time. Interpretative annotation: Clinically appropriate result. Analgesia during this period is likely to be Local Anesthetic-related.          Long-term benefit: Defined as the period of time past the expected duration of local anesthetics (1 hour for short-acting and 4-6 hours for long-acting). With the possible exception of prolonged sympathetic blockade from the local anesthetics, benefits during this period are typically attributed to, or associated with, other factors such as analgesic sensory neuropraxia, antiinflammatory effects, or beneficial biochemical changes provided by agents other than the local anesthetics.  Reported result: Extended relief following procedure: 60 % (Long-Term Relief)            Interpretative annotation: Clinically appropriate result. Partial relief. Incomplete therapeutic success. Inflammation plays a part in the etiology to the pain. Benefit believed to be steroid-related.  Current benefits: Defined as reported results that persistent at this point  in time.   Analgesia: 50-75 % Samuel Roberts reports that both, extremity and the axial pain improved with the treatment. Function: Samuel Roberts reports improvement in function ROM: Samuel Roberts reports improvement in ROM Interpretative annotation: Ongoing benefit. Therapeutic benefit observed. Effective therapeutic approach.           Interpretation: Results would suggest a successful diagnostic intervention.                  Plan:  Proceed with treatment No.: 2          Laboratory Chemistry  Inflammation Markers (CRP: Acute Phase) (ESR: Chronic Phase) Lab Results  Component Value Date   CRP 2.8 06/24/2017   ESRSEDRATE 11 06/24/2017                         Renal Function Markers Lab Results  Component Value Date   BUN 17 09/06/2016   CREATININE 0.73 09/06/2016   GFRAA >60 09/06/2016   GFRNONAA >60 09/06/2016                              Hepatic Function Markers Lab Results  Component Value Date   AST 27 09/05/2016   ALT 15 (L) 09/05/2016   ALBUMIN 4.1 09/05/2016   ALKPHOS 62 09/05/2016                        Electrolytes Lab Results  Component Value Date   NA 138 09/06/2016   K 5.1 09/06/2016   CL 103 09/06/2016   CALCIUM 9.3 09/06/2016   MG 2.4 (H) 06/24/2017                        Bone Pathology Markers Lab Results  Component Value Date   25OHVITD1 37 06/24/2017   25OHVITD2 2.2 06/24/2017   25OHVITD3 35 06/24/2017                         Coagulation Parameters Lab Results  Component Value Date   PLT 306 09/06/2016                        Cardiovascular Markers Lab Results  Component Value Date   TROPONINI <0.03 09/06/2016   HGB 14.1 09/06/2016   HCT 40.7 09/06/2016                         Note: Lab results reviewed.  Recent Diagnostic Imaging Results  DG C-Arm 1-60 Min-No Report Fluoroscopy was utilized by the requesting physician.  No radiographic  interpretation.   Complexity Note: I personally reviewed the fluoroscopic imaging of the procedure.                        Meds   Current Outpatient Medications:  .  mesalamine (LIALDA) 1.2 g EC tablet, Take 1.2 g by mouth 2 (two) times daily., Disp: , Rfl:  .  pantoprazole (PROTONIX) 40 MG tablet, Take 40 mg by mouth every other day. , Disp: , Rfl: 0 .  traMADol (ULTRAM) 50 MG tablet, Take 1 tablet (50 mg total)  by mouth 2 (two) times daily., Disp: 60 tablet, Rfl: 0  ROS  Constitutional: Denies any fever or chills Gastrointestinal: No reported hemesis, hematochezia, vomiting, or acute GI distress  Musculoskeletal: Denies any acute onset joint swelling, redness, loss of ROM, or weakness Neurological: No reported episodes of acute onset apraxia, aphasia, dysarthria, agnosia, amnesia, paralysis, loss of coordination, or loss of consciousness  Allergies  Samuel Roberts has No Known Allergies.  [place]  Drug: Samuel Roberts  reports that he does not use drugs. Alcohol:  reports that he does not drink alcohol. Tobacco:  reports that he has never smoked. He has never used smokeless tobacco. Medical:  has a past medical history of BPH (benign prostatic hyperplasia) and Colitis, ulcerative ([place]). Surgical: Samuel Roberts  has a past surgical history that includes Knee arthroscopy and arthrotomy; Tonsillectomy; LEFT HEART CATH AND CORONARY ANGIOGRAPHY (Left, 09/09/2016); and Joint replacement (Right). Family: family history includes Cancer in his father; Cirrhosis in his brother; Diabetes in his mother, sister, and sister; Drug abuse in his brother and brother; Heart disease in his brother and brother; Heart failure in his father and mother.  Constitutional Exam  General appearance: Well nourished, well developed, and well hydrated. In no apparent acute distress Vitals:   08/05/17 1319  BP: 130/90  Pulse: 73  Resp: 16  Temp: 98.3 F (36.8 C)  TempSrc: Oral  SpO2: 99%  Weight: 200 lb (90.7 kg)  Height: _0  (1.778 m)   BMI Assessment: Estimated body mass index is 28.7 kg/m as calculated from the following:   Height as of this encounter: _1  (1.778 m).   Weight as of this encounter: 200 lb (90.7 kg).  BMI interpretation table: BMI level Category Range association with higher incidence of chronic pain  <18 kg/m2 Underweight   18.5-24.9 kg/m2 Ideal body weight   25-29.9 kg/m2 Overweight Increased incidence  by 20%  30-34.9 kg/m2 Obese (Class I) Increased incidence by 68%  35-39.9 kg/m2 Severe obesity (Class II) Increased incidence by 136%  >40 kg/m2 Extreme obesity (Class III) Increased incidence by 254%   BMI Readings from Last 4 Encounters:  08/05/17 28.70 kg/m  07/14/17 28.70 kg/m  07/13/17 28.70 kg/m  06/24/17 31.01 kg/m   Wt Readings from Last 4 Encounters:  08/05/17 200 lb (90.7 kg)  07/14/17 200 lb (90.7 kg)  07/13/17 200 lb (90.7 kg)  06/24/17 210 lb (95.3 kg)  Psych/Mental status: Alert, oriented x 3 (person, place, & time)       Eyes: PERLA Respiratory: No evidence of acute respiratory distress  Cervical Spine Area Exam  Skin & Axial Inspection: No masses, redness, edema, swelling, or associated skin lesions Alignment: Symmetrical Functional ROM: Improved after treatment      Stability: No instability detected Muscle Tone/Strength: Functionally intact. No obvious neuro-muscular anomalies detected. Sensory (Neurological): Movement-associated discomfort Palpation: Tender              Upper Extremity (UE) Exam    Side: Right upper extremity  Side: Left upper extremity  Skin & Extremity Inspection: Skin color, temperature, and hair growth are WNL. No peripheral edema or cyanosis. No masses, redness, swelling, asymmetry, or associated skin lesions. No contractures.  Skin & Extremity Inspection: Skin color, temperature, and hair growth are WNL. No peripheral edema or cyanosis. No masses, redness, swelling, asymmetry, or associated skin lesions. No contractures.  Functional ROM: Unrestricted ROM          Functional ROM: Unrestricted ROM          Muscle Tone/Strength: Functionally intact. No obvious neuro-muscular anomalies detected.  Muscle Tone/Strength: Functionally intact. No obvious neuro-muscular anomalies detected.  Sensory (Neurological): Unimpaired  Sensory (Neurological): Unimpaired          Palpation: No palpable anomalies              Palpation: No palpable  anomalies              Specialized Test(s): Deferred         Specialized Test(s): Deferred          Thoracic Spine Area Exam  Skin & Axial Inspection: No masses, redness, or swelling Alignment: Symmetrical Functional ROM: Unrestricted ROM Stability: No instability detected Muscle Tone/Strength: Functionally intact. No obvious neuro-muscular anomalies detected. Sensory (Neurological): Unimpaired Muscle strength & Tone: No palpable anomalies  Lumbar Spine Area Exam  Skin & Axial Inspection: No masses, redness, or swelling Alignment: Symmetrical Functional ROM: Unrestricted ROM       Stability: No instability detected Muscle Tone/Strength: Functionally intact. No obvious neuro-muscular anomalies detected. Sensory (Neurological): Unimpaired Palpation: No palpable anomalies       Provocative Tests: Lumbar Hyperextension and rotation test: evaluation deferred today       Lumbar Lateral bending test: evaluation deferred today       Patrick's Maneuver: evaluation deferred today                    Gait & Posture Assessment  Ambulation: Unassisted Gait: Relatively normal for age and body habitus Posture: WNL   Lower Extremity Exam    Side: Right lower extremity  Side: Left lower extremity  Skin & Extremity Inspection: Skin color, temperature, and hair growth are WNL. No peripheral edema or cyanosis. No masses, redness, swelling, asymmetry, or associated skin lesions. No contractures.  Skin & Extremity Inspection: Skin color, temperature, and hair growth are WNL. No peripheral edema or cyanosis. No masses, redness, swelling, asymmetry, or associated skin lesions. No contractures.  Functional ROM: Unrestricted ROM          Functional ROM: Unrestricted ROM          Muscle Tone/Strength: Functionally intact. No obvious neuro-muscular anomalies detected.  Muscle Tone/Strength: Functionally intact. No obvious neuro-muscular anomalies detected.  Sensory (Neurological): Unimpaired  Sensory  (Neurological): Unimpaired  Palpation: No palpable anomalies  Palpation: No palpable anomalies   Assessment  Primary Diagnosis & Pertinent Problem List: The primary encounter diagnosis was Chronic neck pain  (Primary Area of Pain) (Bilateral) (L>R). Diagnoses of Chronic shoulder pain (Secondary Area of Pain) (Left), Occipital neuralgia (Tertiary Area of Pain) (Left), Chronic low back pain (Fourth Area of Pain) (Right) without sciatica, Chronic knee pain (Fifth Area of Pain) (Bilateral) (R>L), DDD (degenerative disc disease), cervical, Tricompartment osteoarthritis of knee (Right), and Osteoarthritis of knees (Bilateral) were also pertinent to this visit.  Status Diagnosis  Controlled Controlled Controlled 1. Chronic neck pain  (Primary Area of Pain) (Bilateral) (L>R)   2. Chronic shoulder pain (Secondary Area of Pain) (Left)   3. Occipital neuralgia (Tertiary Area of Pain) (Left)   4. Chronic low back pain (Fourth Area of Pain) (Right) without sciatica   5. Chronic knee pain (Fifth Area of Pain) (Bilateral) (R>L)   6. DDD (degenerative disc disease), cervical   7. Tricompartment osteoarthritis of knee (Right)   8. Osteoarthritis of knees (Bilateral)     Problems updated and reviewed during this visit: Problem  Tricompartment osteoarthritis of knee (Right)  Osteoarthritis of knees (Bilateral)   Plan of Care  Pharmacotherapy (Medications Ordered): No orders of the defined types were placed in this encounter.  Medications administered today: Vangie Bicker. Churilla had no medications  administered during this visit.   Procedure Orders     Cervical Epidural Injection Lab Orders  No laboratory test(s) ordered today   Imaging Orders  No imaging studies ordered today   Referral Orders  No referral(s) requested today    Interventional management options: Planned, scheduled, and/or pending:   Diagnostic left cervical epidural steroid injection #2 under fluoroscopic guidance, no sedation.    Considering:   Diagnostic left-sided cervical facet nerve block Possible left-sided cervical facet RFA Diagnosticleft suprascapular nerve block Diagnostictrigger point injection Diagnosticright lumbar facet nerve block Diagnostic right lumbar facet RFA Diagnostic Left knee Hyalgan series Diagnostic Right knee genicular nerve block Possible right knee genicular RFA   Palliative PRN treatment(s):   None at this time   Provider-requested follow-up: Return for Procedure (no sedation): (L) CESI #2.  No future appointments. Primary Care Physician: [name], MD Location: [facility] Note by: Gaspar Cola, MD Date: 08/05/2017; Time: 3:28 PM

## 2017-08-13 ENCOUNTER — Encounter: Payer: Self-pay | Admitting: Nurse Practitioner

## 2017-08-13 ENCOUNTER — Other Ambulatory Visit: Payer: Self-pay

## 2017-08-13 ENCOUNTER — Ambulatory Visit: Payer: 59 | Admitting: Pain Medicine

## 2017-08-13 ENCOUNTER — Ambulatory Visit: Payer: 59 | Attending: Nurse Practitioner | Admitting: Nurse Practitioner

## 2017-08-13 VITALS — BP 123/92 | HR 67 | Temp 98.3°F | Resp 18 | Ht 70.0 in | Wt 200.0 lb

## 2017-08-13 DIAGNOSIS — M25561 Pain in right knee: Secondary | ICD-10-CM | POA: Insufficient documentation

## 2017-08-13 DIAGNOSIS — M47812 Spondylosis without myelopathy or radiculopathy, cervical region: Secondary | ICD-10-CM | POA: Diagnosis not present

## 2017-08-13 DIAGNOSIS — M79602 Pain in left arm: Secondary | ICD-10-CM | POA: Diagnosis not present

## 2017-08-13 DIAGNOSIS — M4802 Spinal stenosis, cervical region: Secondary | ICD-10-CM | POA: Diagnosis not present

## 2017-08-13 DIAGNOSIS — K5190 Ulcerative colitis, unspecified, without complications: Secondary | ICD-10-CM | POA: Diagnosis not present

## 2017-08-13 DIAGNOSIS — M545 Low back pain: Secondary | ICD-10-CM | POA: Diagnosis present

## 2017-08-13 DIAGNOSIS — M170 Bilateral primary osteoarthritis of knee: Secondary | ICD-10-CM | POA: Diagnosis not present

## 2017-08-13 DIAGNOSIS — Z8042 Family history of malignant neoplasm of prostate: Secondary | ICD-10-CM | POA: Diagnosis not present

## 2017-08-13 DIAGNOSIS — Z79891 Long term (current) use of opiate analgesic: Secondary | ICD-10-CM | POA: Diagnosis not present

## 2017-08-13 DIAGNOSIS — G894 Chronic pain syndrome: Secondary | ICD-10-CM

## 2017-08-13 DIAGNOSIS — I209 Angina pectoris, unspecified: Secondary | ICD-10-CM | POA: Insufficient documentation

## 2017-08-13 DIAGNOSIS — M25512 Pain in left shoulder: Secondary | ICD-10-CM | POA: Insufficient documentation

## 2017-08-13 DIAGNOSIS — N400 Enlarged prostate without lower urinary tract symptoms: Secondary | ICD-10-CM | POA: Diagnosis not present

## 2017-08-13 DIAGNOSIS — M542 Cervicalgia: Secondary | ICD-10-CM | POA: Diagnosis present

## 2017-08-13 DIAGNOSIS — G8929 Other chronic pain: Secondary | ICD-10-CM | POA: Insufficient documentation

## 2017-08-13 DIAGNOSIS — M5481 Occipital neuralgia: Secondary | ICD-10-CM

## 2017-08-13 DIAGNOSIS — M5030 Other cervical disc degeneration, unspecified cervical region: Secondary | ICD-10-CM | POA: Insufficient documentation

## 2017-08-13 MED ORDER — TRAMADOL HCL 50 MG PO TABS: 50.0000 mg | ORAL_TABLET | Freq: Two times a day (BID) | ORAL | 2 refills | Status: DC

## 2017-08-13 NOTE — Progress Notes (Signed)
Patient's Name: WENDY MIKLES  MRN: [id number]  Referring Provider: [name], MD  DOB: 1956/01/21  PCP: [name], MD  DOS: 08/13/2017  Note by: Vevelyn Francois NP  Service setting: Ambulatory outpatient  Specialty: Interventional Pain Management  Location: [facility] (AMB) Pain Management Facility    Patient type: Established    Primary Reason(s) for Visit: Encounter for prescription drug management. (Level of risk: moderate)  CC: Neck Pain; Back Pain (lower); and Knee Pain (right)  HPI  Mr. Crescenzo is a 62 y.o. year old, male patient, who comes today for a medication management evaluation. He has Angina pectoris ([place]); UC (ulcerative colitis) ([place]); BPH (benign prostatic hyperplasia); Carcinoma in situ of prostate; Elevated prostate specific antigen (PSA); Family history of malignant neoplasm of prostate; Arthropathy of knee (Right); Enlarged prostate with lower urinary tract symptoms (LUTS); Chronic neck pain  (Primary Area of Pain) (Bilateral) (L>R); Occipital neuralgia (Tertiary Area of Pain) (Left); Chronic shoulder pain (Secondary Area of Pain) (Left); Chronic low back pain (Fourth Area of Pain) (Right) without sciatica; Chronic knee pain (Fifth Area of Pain) (Bilateral) (R>L); Chronic pain syndrome; Long term current use of opiate analgesic; Pharmacologic therapy; Disorder of skeletal system; Problems influencing health status; Pain of left humerus; Chronic upper extremity pain (Left); DDD (degenerative disc disease), cervical; Cervical spondylosis; 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 His primarily concern today is the Neck Pain; Back Pain (lower); and Knee Pain (right)  Pain Assessment: Location: Right Neck Radiating: denies Onset: More than a month ago Duration: Chronic pain Quality: Aching, Sharp Severity: 5 (knee pain)/10 (self-reported  pain score)  Note: Reported level is compatible with observation.                         When using our objective Pain Scale, levels between 6 and 10/10 are said to belong in an emergency room, as it progressively worsens from a 6/10, described as severely limiting, requiring emergency care not usually available at an outpatient pain management facility. At a 6/10 level, communication becomes difficult and requires great effort. Assistance to reach the emergency department may be required. Facial flushing and profuse sweating along with potentially dangerous increases in heart rate and blood pressure will be evident. Timing: Constant Modifying factors: medications  Mr. Heady was last scheduled for an appointment on 06/24/2017 for medication management. During today's appointment we reviewed Mr. Munce's chronic pain status, as well as his outpatient medication regimen. He is SP CESI and he admits that he is doing well. He states that his ROM has improved . He still has a little pain but nothing like it was. He is going to have a repeat of this in the next few weeks.   The patient  reports that he does not use drugs. His body mass index is 28.7 kg/m.  Further details on both, my assessment(s), as well as the proposed treatment plan, please see below.  Controlled Substance Pharmacotherapy Assessment REMS (Risk Evaluation and Mitigation Strategy)  Analgesic: Tramadol 50 twice daily tramadol 100 mg per day MME/day: 100 mg/day.  Landis Martins, RN  08/13/2017  9:20 AM  Sign at close encounter Nursing Pain Medication Assessment:  Safety precautions to be maintained throughout the outpatient stay will include: orient to surroundings, keep bed in low position, maintain call bell within reach at all times, provide assistance with transfer out of bed  and ambulation.  Medication Inspection Compliance: Pill count conducted under aseptic conditions, in front of the patient. Neither the pills nor the bottle  was removed from the patient's sight at any time. Once count was completed pills were immediately returned to the patient in their original bottle.  Medication: Tramadol (Ultram) Pill/Patch Count: 6 of 60 pills remain Pill/Patch Appearance: Markings consistent with prescribed medication Bottle Appearance: Standard pharmacy container. Clearly labeled. Filled Date: 04/01/ 2018 Last Medication intake:  Today   Pharmacokinetics: Liberation and absorption (onset of action): WNL Distribution (time to peak effect): WNL Metabolism and excretion (duration of action): WNL         Pharmacodynamics: Desired effects: Analgesia: Mr. Prime reports >50% benefit. Functional ability: Patient reports that medication allows him to accomplish basic ADLs Clinically meaningful improvement in function (CMIF): Sustained CMIF goals met Perceived effectiveness: Described as relatively effective, allowing for increase in activities of daily living (ADL) Undesirable effects: Side-effects or Adverse reactions: None reported Monitoring: [place] PMP: Online review of the past 28-monthperiod conducted. Compliant with practice rules and regulations Last UDS on record: Summary  Date Value Ref Range Status  06/24/2017 FINAL  Final    Comment:    ==================================================================== TOXASSURE COMP DRUG ANALYSIS,UR ==================================================================== Test                             Result       Flag       Units Drug Present and Declared for Prescription Verification   Tramadol                       >2083        EXPECTED   ng/mg creat   O-Desmethyltramadol            >2083        EXPECTED   ng/mg creat   N-Desmethyltramadol            1305         EXPECTED   ng/mg creat    Source of tramadol is a prescription medication.    O-desmethyltramadol and N-desmethyltramadol are expected    metabolites of tramadol. Drug Present not Declared for Prescription  Verification   Acetaminophen                  PRESENT      UNEXPECTED   Lidocaine                      PRESENT      UNEXPECTED Drug Absent but Declared for Prescription Verification   Metoprolol                     Not Detected UNEXPECTED ==================================================================== Test                      Result    Flag   Units      Ref Range   Creatinine              240              mg/dL      >=20 ==================================================================== Declared Medications:  The flagging and interpretation on this report are based on the  following declared medications.  Unexpected results may arise from  inaccuracies in the declared medications.  **Note: The testing scope of this panel includes these medications:  Metoprolol (Lopressor)  Tramadol (Ultram)  **Note: The testing scope of this panel does not include following  reported medications:  Pantoprazole (Protonix) ==================================================================== For clinical consultation, please call [phone]. ====================================================================    UDS interpretation: Compliant          Medication Assessment Form: Reviewed. Patient indicates being compliant with therapy Treatment compliance: Compliant Risk Assessment Profile: Aberrant behavior: See prior evaluations. None observed or detected today Comorbid factors increasing risk of overdose: See prior notes. No additional risks detected today Risk of substance use disorder (SUD): Low Opioid Risk Tool - 08/05/17 1338      Family History of Substance Abuse   Alcohol  Positive Male    Illegal Drugs  Positive Male    Rx Drugs  Positive Male or Male      Personal History of Substance Abuse   Alcohol  Negative    Illegal Drugs  Negative    Rx Drugs  Negative      Age   Age between 87-45 years   No      History of Preadolescent Sexual Abuse   History of Preadolescent  Sexual Abuse  Negative or Male      Psychological Disease   Psychological Disease  Negative    Depression  Negative      Total Score   Opioid Risk Tool Scoring  10    Opioid Risk Interpretation  High Risk      ORT Scoring interpretation table:  Score <3 = Low Risk for SUD  Score between 4-7 = Moderate Risk for SUD  Score >8 = High Risk for Opioid Abuse   Risk Mitigation Strategies:  Patient Counseling: Covered Patient-Prescriber Agreement (PPA): Present and active  Notification to other healthcare providers: Done  Pharmacologic Plan: No change in therapy, at this time.             Laboratory Chemistry  Inflammation Markers (CRP: Acute Phase) (ESR: Chronic Phase) Lab Results  Component Value Date   CRP 2.8 06/24/2017   ESRSEDRATE 11 06/24/2017                         Rheumatology Markers No results found for: RF, ANA, Therisa Doy[place], [facility]                      Renal Function Markers Lab Results  Component Value Date   BUN 17 09/06/2016   CREATININE 0.73 09/06/2016   GFRAA >60 09/06/2016   GFRNONAA >60 09/06/2016                              Hepatic Function Markers Lab Results  Component Value Date   AST 27 09/05/2016   ALT 15 (L) 09/05/2016   ALBUMIN 4.1 09/05/2016   ALKPHOS 62 09/05/2016                        Electrolytes Lab Results  Component Value Date   NA 138 09/06/2016   K 5.1 09/06/2016   CL 103 09/06/2016   CALCIUM 9.3 09/06/2016   MG 2.4 (H) 06/24/2017                        Neuropathy Markers No results found for: VITAMINB12, FOLATE, HGBA1C, HIV  Bone Pathology Markers Lab Results  Component Value Date   25OHVITD1 37 06/24/2017   25OHVITD2 2.2 06/24/2017   25OHVITD3 35 06/24/2017                         Coagulation Parameters Lab Results  Component Value Date   PLT 306 09/06/2016                        Cardiovascular Markers Lab Results  Component Value Date   TROPONINI <0.03  09/06/2016   HGB 14.1 09/06/2016   HCT 40.7 09/06/2016                         CA Markers No results found for: CEA, CA125, LABCA2                      Note: Lab results reviewed.  Recent Diagnostic Imaging Results  DG C-Arm 1-60 Min-No Report Fluoroscopy was utilized by the requesting physician.  No radiographic  interpretation.   Complexity Note: Imaging results reviewed. Results shared with Mr. Simoneaux, using Layman's terms.                         Meds   Current Outpatient Medications:  .  mesalamine (LIALDA) 1.2 g EC tablet, Take 1.2 g by mouth 2 (two) times daily., Disp: , Rfl:  .  pantoprazole (PROTONIX) 40 MG tablet, Take 40 mg by mouth every other day. , Disp: , Rfl: 0 .  [START ON 08/19/2017] traMADol (ULTRAM) 50 MG tablet, Take 1 tablet (50 mg total) by mouth 2 (two) times daily., Disp: 60 tablet, Rfl: 2  ROS  Constitutional: Denies any fever or chills Gastrointestinal: No reported hemesis, hematochezia, vomiting, or acute GI distress Musculoskeletal: Denies any acute onset joint swelling, redness, loss of ROM, or weakness Neurological: No reported episodes of acute onset apraxia, aphasia, dysarthria, agnosia, amnesia, paralysis, loss of coordination, or loss of consciousness  Allergies  Mr. Sem has No Known Allergies.  [place]  Drug: Mr. Lukas  reports that he does not use drugs. Alcohol:  reports that he does not drink alcohol. Tobacco:  reports that he has never smoked. He has never used smokeless tobacco. Medical:  has a past medical history of BPH (benign prostatic hyperplasia) and Colitis, ulcerative ([place]). Surgical: Mr. Higashi  has a past surgical history that includes Knee arthroscopy and arthrotomy; Tonsillectomy; LEFT HEART CATH AND CORONARY ANGIOGRAPHY (Left, 09/09/2016); and Joint replacement (Right). Family: family history includes Cancer in his father; Cirrhosis in his brother; Diabetes in his mother, sister, and sister; Drug abuse in his brother and  brother; Heart disease in his brother and brother; Heart failure in his father and mother.  Constitutional Exam  General appearance: Well nourished, well developed, and well hydrated. In no apparent acute distress Vitals:   08/13/17 0916  BP: (!) 123/92  Pulse: 67  Resp: 18  Temp: 98.3 F (36.8 C)  TempSrc: Oral  SpO2: 96%  Weight: 200 lb (90.7 kg)  Height: 5' 10"  (1.778 m)  Psych/Mental status: Alert, oriented x 3 (person, place, & time)       Eyes: PERLA Respiratory: No evidence of acute respiratory distress  Cervical Spine Area Exam  Skin & Axial Inspection: No masses, redness, edema, swelling, or associated skin lesions Alignment: Symmetrical Functional ROM: Adequate ROM      Stability:  No instability detected Muscle Tone/Strength: Functionally intact. No obvious neuro-muscular anomalies detected. Sensory (Neurological): Unimpaired Palpation: No palpable anomalies              Upper Extremity (UE) Exam    Side: Right upper extremity  Side: Left upper extremity  Skin & Extremity Inspection: Skin color, temperature, and hair growth are WNL. No peripheral edema or cyanosis. No masses, redness, swelling, asymmetry, or associated skin lesions. No contractures.  Skin & Extremity Inspection: Skin color, temperature, and hair growth are WNL. No peripheral edema or cyanosis. No masses, redness, swelling, asymmetry, or associated skin lesions. No contractures.  Functional ROM: Unrestricted ROM          Functional ROM: Unrestricted ROM          Muscle Tone/Strength: Functionally intact. No obvious neuro-muscular anomalies detected.  Muscle Tone/Strength: Functionally intact. No obvious neuro-muscular anomalies detected.  Sensory (Neurological): Unimpaired          Sensory (Neurological): Unimpaired          Palpation: No palpable anomalies              Palpation: No palpable anomalies              Specialized Test(s): Deferred         Specialized Test(s): Deferred          Gait &  Posture Assessment  Ambulation: Unassisted Gait: Relatively normal for age and body habitus Posture: WNL    Assessment  Primary Diagnosis & Pertinent Problem List: The primary encounter diagnosis was Chronic neck pain  (Primary Area of Pain) (Bilateral) (L>R). Diagnoses of Cervical spondylosis, Occipital neuralgia (Tertiary Area of Pain) (Left), and Chronic pain syndrome were also pertinent to this visit.  Status Diagnosis  Controlled Controlled Controlled 1. Chronic neck pain  (Primary Area of Pain) (Bilateral) (L>R)   2. Cervical spondylosis   3. Occipital neuralgia (Tertiary Area of Pain) (Left)   4. Chronic pain syndrome     Problems updated and reviewed during this visit: No problems updated. Plan of Care  Pharmacotherapy (Medications Ordered): Meds ordered this encounter  Medications  . traMADol (ULTRAM) 50 MG tablet    Sig: Take 1 tablet (50 mg total) by mouth 2 (two) times daily.    Dispense:  60 tablet    Refill:  2    Fill one day early if pharmacy is closed on scheduled refill date.  Fill every 30 days .Do not fill until: 08/19/2017 To last until: 11/17/2017    Order Specific Question:   Supervising Provider    Answer:   Milinda Pointer [phone]   New Prescriptions   No medications on file   Medications administered today: Deyvi Bonanno. Keeble had no medications administered during this visit. Lab-work, procedure(s), and/or referral(s): No orders of the defined types were placed in this encounter.  Imaging and/or referral(s): None  Interventional management options: Planned, scheduled, and/or pending:    Diagnostic left-sided cervical epidural steroid injection #2 to be reschedule pt to call   Considering:   Diagnostic left-sided cervical facet nerve block Possible left-sided cervical facet RFA Diagnosticleft suprascapular nerve block Diagnostictrigger point injection Diagnosticright lumbar facet nerve block Diagnostic right lumbar facet  RFA Diagnostic Left knee Hyalgan series Diagnostic Right knee genicular nerve block Possible right knee genicular RFA   PRN Procedures:   None at this time    Provider-requested follow-up: Return in about 3 months (around 11/12/2017) for MedMgmt with Me Donella Stade Edison Pace).  Future Appointments  Date Time Provider [place]  11/12/2017 12:45 PM Vevelyn Francois, NP [facility] None   Primary Care Physician: [name], MD Location: [facility] Outpatient Pain Management Facility Note by: Vevelyn Francois NP Date: 08/13/2017; Time: 9:59 AM  Pain Score Disclaimer: We use the NRS-11 scale. This is a self-reported, subjective measurement of pain severity with only modest accuracy. It is used primarily to identify changes within a particular patient. It must be understood that outpatient pain scales are significantly less accurate that those used for research, where they can be applied under ideal controlled circumstances with minimal exposure to variables. In reality, the score is likely to be a combination of pain intensity and pain affect, where pain affect describes the degree of emotional arousal or changes in action readiness caused by the sensory experience of pain. Factors such as social and work situation, setting, emotional state, anxiety levels, expectation, and prior pain experience may influence pain perception and show large inter-individual differences that may also be affected by time variables.  Patient instructions provided during this appointment: Patient Instructions   ____________________________________________________________________________________________  Medication Rules  Applies to: All patients receiving prescriptions (written or electronic).  Pharmacy of record: Pharmacy where electronic prescriptions will be sent. If written prescriptions are taken to a different pharmacy, please inform the nursing staff. The pharmacy listed in the electronic medical record  should be the one where you would like electronic prescriptions to be sent.  Prescription refills: Only during scheduled appointments. Applies to both, written and electronic prescriptions.  NOTE: The following applies primarily to controlled substances (Opioid* Pain Medications).   Patient's responsibilities: 1. Pain Pills: Bring all pain pills to every appointment (except for procedure appointments). 2. Pill Bottles: Bring pills in original pharmacy bottle. Always bring newest bottle. Bring bottle, even if empty. 3. Medication refills: You are responsible for knowing and keeping track of what medications you need refilled. The day before your appointment, write a list of all prescriptions that need to be refilled. Bring that list to your appointment and give it to the admitting nurse. Prescriptions will be written only during appointments. If you forget a medication, it will not be "Called in", "Faxed", or "electronically sent". You will need to get another appointment to get these prescribed. 4. Prescription Accuracy: You are responsible for carefully inspecting your prescriptions before leaving our office. Have the discharge nurse carefully go over each prescription with you, before taking them home. Make sure that your name is accurately spelled, that your address is correct. Check the name and dose of your medication to make sure it is accurate. Check the number of pills, and the written instructions to make sure they are clear and accurate. Make sure that you are given enough medication to last until your next medication refill appointment. 5. Taking Medication: Take medication as prescribed. Never take more pills than instructed. Never take medication more frequently than prescribed. Taking less pills or less frequently is permitted and encouraged, when it comes to controlled substances (written prescriptions).  6. Inform other Doctors: Always inform, all of your healthcare providers, of all the  medications you take. 7. Pain Medication from other Providers: You are not allowed to accept any additional pain medication from any other Doctor or Healthcare provider. There are two exceptions to this rule. (see below) In the event that you require additional pain medication, you are responsible for notifying us, as stated below. 8. Medication Agreement: You are responsible for carefully reading and following our Medication Agreement. This  must be signed before receiving any prescriptions from our practice. Safely store a copy of your signed Agreement. Violations to the Agreement will result in no further prescriptions. (Additional copies of our Medication Agreement are available upon request.) 9. Laws, Rules, & Regulations: All patients are expected to follow all Federal and [employer], [employer], Rules, [employer]. Ignorance of the Laws does not constitute a valid excuse. The use of any illegal substances is prohibited. 10. Adopted CDC guidelines & recommendations: Target dosing levels will be at or below 60 MME/day. Use of benzodiazepines** is not recommended.  Exceptions: There are only two exceptions to the rule of not receiving pain medications from other Healthcare Providers. 1. Exception #1 (Emergencies): In the event of an emergency (i.e.: accident requiring emergency care), you are allowed to receive additional pain medication. However, you are responsible for: As soon as you are able, call our office [phone], at any time of the day or night, and leave a message stating your name, the date and nature of the emergency, and the name and dose of the medication prescribed. In the event that your call is answered by a member of our staff, make sure to document and save the date, time, and the name of the person that took your information.  2. Exception #2 (Planned Surgery): In the event that you are scheduled by another doctor or dentist to have any type of surgery or procedure, you are  allowed (for a period no longer than 30 days), to receive additional pain medication, for the acute post-op pain. However, in this case, you are responsible for picking up a copy of our "Post-op Pain Management for Surgeons" handout, and giving it to your surgeon or dentist. This document is available at our office, and does not require an appointment to obtain it. Simply go to our office during business hours (Monday-Thursday from 8:00 AM to 4:00 PM) (Friday 8:00 AM to 12:00 Noon) or if you have a scheduled appointment with [place], prior to your surgery, and ask for it by name. In addition, you will need to provide [place] with your name, name of your surgeon, type of surgery, and date of procedure or surgery.  *Opioid medications include: morphine, codeine, oxycodone, oxymorphone, hydrocodone, hydromorphone, meperidine, tramadol, tapentadol, buprenorphine, fentanyl, methadone. **Benzodiazepine medications include: diazepam (Valium), alprazolam (Xanax), clonazepam (Klonopine), lorazepam (Ativan), clorazepate (Tranxene), chlordiazepoxide (Librium), estazolam (Prosom), oxazepam (Serax), temazepam (Restoril), triazolam (Halcion) (Last updated: 06/18/2017) ____________________________________________________________________________________________    BMI Assessment: Estimated body mass index is 28.7 kg/m as calculated from the following:   Height as of this encounter: 5' 10"  (1.778 m).   Weight as of this encounter: 200 lb (90.7 kg).  BMI interpretation table: BMI level Category Range association with higher incidence of chronic pain  <18 kg/m2 Underweight   18.5-24.9 kg/m2 Ideal body weight   25-29.9 kg/m2 Overweight Increased incidence by 20%  30-34.9 kg/m2 Obese (Class I) Increased incidence by 68%  35-39.9 kg/m2 Severe obesity (Class II) Increased incidence by 136%  >40 kg/m2 Extreme obesity (Class III) Increased incidence by 254%   Patient's current BMI Ideal Body weight  Body mass index is 28.7  kg/m. Ideal body weight: 73 kg (160 lb 15 oz) Adjusted ideal body weight: 80.1 kg (176 lb 9 oz)   BMI Readings from Last 4 Encounters:  08/13/17 28.70 kg/m  08/05/17 28.70 kg/m  07/14/17 28.70 kg/m  07/13/17 28.70 kg/m   Wt Readings from Last 4 Encounters:  08/13/17 200 lb (90.7 kg)  08/05/17  200 lb (90.7 kg)  07/14/17 200 lb (90.7 kg)  07/13/17 200 lb (90.7 kg)  Epidural Steroid Injection Patient Information  Description: The epidural space surrounds the nerves as they exit the spinal cord.  In some patients, the nerves can be compressed and inflamed by a bulging disc or a tight spinal canal (spinal stenosis).  By injecting steroids into the epidural space, we can bring irritated nerves into direct contact with a potentially helpful medication.  These steroids act directly on the irritated nerves and can reduce swelling and inflammation which often leads to decreased pain.  Epidural steroids may be injected anywhere along the spine and from the neck to the low back depending upon the location of your pain.   After numbing the skin with local anesthetic (like Novocaine), a small needle is passed into the epidural space slowly.  You may experience a sensation of pressure while this is being done.  The entire block usually last less than 10 minutes.  Conditions which may be treated by epidural steroids:   Low back and leg pain  Neck and arm pain  Spinal stenosis  Post-laminectomy syndrome  Herpes zoster (shingles) pain  Pain from compression fractures  Preparation for the injection:  3. Do not eat any solid food or dairy products within 8 hours of your appointment.  4. You may drink clear liquids up to 3 hours before appointment.  Clear liquids include water, black coffee, juice or soda.  No milk or cream please. 5. You may take your regular medication, including pain medications, with a sip of water before your appointment  Diabetics should hold regular insulin (if taken  separately) and take 1/2 normal NPH dos the morning of the procedure.  Carry some sugar containing items with you to your appointment. 6. A driver must accompany you and be prepared to drive you home after your procedure.  7. Bring all your current medications with your. 8. An IV may be inserted and sedation may be given at the discretion of the physician.   9. A blood pressure cuff, EKG and other monitors will often be applied during the procedure.  Some patients may need to have extra oxygen administered for a short period. 10. You will be asked to provide medical information, including your allergies, prior to the procedure.  We must know immediately if you are taking blood thinners (like Coumadin/Warfarin)  Or if you are allergic to IV iodine contrast (dye). We must know if you could possible be pregnant.  Possible side-effects:  Bleeding from needle site  Infection (rare, may require surgery)  Nerve injury (rare)  Numbness & tingling (temporary)  Difficulty urinating (rare, temporary)  Spinal headache ( a headache worse with upright posture)  Light -headedness (temporary)  Pain at injection site (several days)  Decreased blood pressure (temporary)  Weakness in arm/leg (temporary)  Pressure sensation in back/neck (temporary)  Call if you experience:  Fever/chills associated with headache or increased back/neck pain.  Headache worsened by an upright position.  New onset weakness or numbness of an extremity below the injection site  Hives or difficulty breathing (go to the emergency room)  Inflammation or drainage at the infection site  Severe back/neck pain  Any new symptoms which are concerning to you  Please note:  Although the local anesthetic injected can often make your back or neck feel good for several hours after the injection, the pain will likely return.  It takes 3-7 days for steroids to work in the  epidural space.  You may not notice any pain relief for  at least that one week.  If effective, we will often do a series of three injections spaced 3-6 weeks apart to maximally decrease your pain.  After the initial series, we generally will wait several months before considering a repeat injection of the same type.  If you have any questions, please call [phone] [place] were given on e prescription for Tramadol today.

## 2017-08-13 NOTE — Progress Notes (Signed)
Nursing Pain Medication Assessment:  Safety precautions to be maintained throughout the outpatient stay will include: orient to surroundings, keep bed in low position, maintain call bell within reach at all times, provide assistance with transfer out of bed and ambulation.  Medication Inspection Compliance: Pill count conducted under aseptic conditions, in front of the patient. Neither the pills nor the bottle was removed from the patient's sight at any time. Once count was completed pills were immediately returned to the patient in their original bottle.  Medication: Tramadol (Ultram) Pill/Patch Count: 6 of 60 pills remain Pill/Patch Appearance: Markings consistent with prescribed medication Bottle Appearance: Standard pharmacy container. Clearly labeled. Filled Date: 04/01/ 2018 Last Medication intake:  Today

## 2017-08-13 NOTE — Patient Instructions (Addendum)
____________________________________________________________________________________________  Medication Rules  Applies to: All patients receiving prescriptions (written or electronic).  Pharmacy of record: Pharmacy where electronic prescriptions will be sent. If written prescriptions are taken to a different pharmacy, please inform the nursing staff. The pharmacy listed in the electronic medical record should be the one where you would like electronic prescriptions to be sent.  Prescription refills: Only during scheduled appointments. Applies to both, written and electronic prescriptions.  NOTE: The following applies primarily to controlled substances (Opioid* Pain Medications).   Patient's responsibilities: 1. Pain Pills: Bring all pain pills to every appointment (except for procedure appointments). 2. Pill Bottles: Bring pills in original pharmacy bottle. Always bring newest bottle. Bring bottle, even if empty. 3. Medication refills: You are responsible for knowing and keeping track of what medications you need refilled. The day before your appointment, write a list of all prescriptions that need to be refilled. Bring that list to your appointment and give it to the admitting nurse. Prescriptions will be written only during appointments. If you forget a medication, it will not be "Called in", "Faxed", or "electronically sent". You will need to get another appointment to get these prescribed. 4. Prescription Accuracy: You are responsible for carefully inspecting your prescriptions before leaving our office. Have the discharge nurse carefully go over each prescription with you, before taking them home. Make sure that your name is accurately spelled, that your address is correct. Check the name and dose of your medication to make sure it is accurate. Check the number of pills, and the written instructions to make sure they are clear and accurate. Make sure that you are given enough medication to last  until your next medication refill appointment. 5. Taking Medication: Take medication as prescribed. Never take more pills than instructed. Never take medication more frequently than prescribed. Taking less pills or less frequently is permitted and encouraged, when it comes to controlled substances (written prescriptions).  6. Inform other Doctors: Always inform, all of your healthcare providers, of all the medications you take. 7. Pain Medication from other Providers: You are not allowed to accept any additional pain medication from any other Doctor or Healthcare provider. There are two exceptions to this rule. (see below) In the event that you require additional pain medication, you are responsible for notifying us, as stated below. 8. Medication Agreement: You are responsible for carefully reading and following our Medication Agreement. This must be signed before receiving any prescriptions from our practice. Safely store a copy of your signed Agreement. Violations to the Agreement will result in no further prescriptions. (Additional copies of our Medication Agreement are available upon request.) 9. Laws, Rules, & Regulations: All patients are expected to follow all Federal and [employer], [employer], Rules, [employer]. Ignorance of the Laws does not constitute a valid excuse. The use of any illegal substances is prohibited. 10. Adopted CDC guidelines & recommendations: Target dosing levels will be at or below 60 MME/day. Use of benzodiazepines** is not recommended.  Exceptions: There are only two exceptions to the rule of not receiving pain medications from other Healthcare Providers. 1. Exception #1 (Emergencies): In the event of an emergency (i.e.: accident requiring emergency care), you are allowed to receive additional pain medication. However, you are responsible for: As soon as you are able, call our office [phone], at any time of the day or night, and leave a message stating your name, the  date and nature of the emergency, and the name and dose of the medication  prescribed. In the event that your call is answered by a member of our staff, make sure to document and save the date, time, and the name of the person that took your information.  2. Exception #2 (Planned Surgery): In the event that you are scheduled by another doctor or dentist to have any type of surgery or procedure, you are allowed (for a period no longer than 30 days), to receive additional pain medication, for the acute post-op pain. However, in this case, you are responsible for picking up a copy of our "Post-op Pain Management for Surgeons" handout, and giving it to your surgeon or dentist. This document is available at our office, and does not require an appointment to obtain it. Simply go to our office during business hours (Monday-Thursday from 8:00 AM to 4:00 PM) (Friday 8:00 AM to 12:00 Noon) or if you have a scheduled appointment with [place], prior to your surgery, and ask for it by name. In addition, you will need to provide [place] with your name, name of your surgeon, type of surgery, and date of procedure or surgery.  *Opioid medications include: morphine, codeine, oxycodone, oxymorphone, hydrocodone, hydromorphone, meperidine, tramadol, tapentadol, buprenorphine, fentanyl, methadone. **Benzodiazepine medications include: diazepam (Valium), alprazolam (Xanax), clonazepam (Klonopine), lorazepam (Ativan), clorazepate (Tranxene), chlordiazepoxide (Librium), estazolam (Prosom), oxazepam (Serax), temazepam (Restoril), triazolam (Halcion) (Last updated: 06/18/2017) ____________________________________________________________________________________________    BMI Assessment: Estimated body mass index is 28.7 kg/m as calculated from the following:   Height as of this encounter: 5' 10"  (1.778 m).   Weight as of this encounter: 200 lb (90.7 kg).  BMI interpretation table: BMI level Category Range association with higher  incidence of chronic pain  <18 kg/m2 Underweight   18.5-24.9 kg/m2 Ideal body weight   25-29.9 kg/m2 Overweight Increased incidence by 20%  30-34.9 kg/m2 Obese (Class I) Increased incidence by 68%  35-39.9 kg/m2 Severe obesity (Class II) Increased incidence by 136%  >40 kg/m2 Extreme obesity (Class III) Increased incidence by 254%   Patient's current BMI Ideal Body weight  Body mass index is 28.7 kg/m. Ideal body weight: 73 kg (160 lb 15 oz) Adjusted ideal body weight: 80.1 kg (176 lb 9 oz)   BMI Readings from Last 4 Encounters:  08/13/17 28.70 kg/m  08/05/17 28.70 kg/m  07/14/17 28.70 kg/m  07/13/17 28.70 kg/m   Wt Readings from Last 4 Encounters:  08/13/17 200 lb (90.7 kg)  08/05/17 200 lb (90.7 kg)  07/14/17 200 lb (90.7 kg)  07/13/17 200 lb (90.7 kg)  Epidural Steroid Injection Patient Information  Description: The epidural space surrounds the nerves as they exit the spinal cord.  In some patients, the nerves can be compressed and inflamed by a bulging disc or a tight spinal canal (spinal stenosis).  By injecting steroids into the epidural space, we can bring irritated nerves into direct contact with a potentially helpful medication.  These steroids act directly on the irritated nerves and can reduce swelling and inflammation which often leads to decreased pain.  Epidural steroids may be injected anywhere along the spine and from the neck to the low back depending upon the location of your pain.   After numbing the skin with local anesthetic (like Novocaine), a small needle is passed into the epidural space slowly.  You may experience a sensation of pressure while this is being done.  The entire block usually last less than 10 minutes.  Conditions which may be treated by epidural steroids:   Low back and leg pain  Neck  and arm pain  Spinal stenosis  Post-laminectomy syndrome  Herpes zoster (shingles) pain  Pain from compression fractures  Preparation for the  injection:  3. Do not eat any solid food or dairy products within 8 hours of your appointment.  4. You may drink clear liquids up to 3 hours before appointment.  Clear liquids include water, black coffee, juice or soda.  No milk or cream please. 5. You may take your regular medication, including pain medications, with a sip of water before your appointment  Diabetics should hold regular insulin (if taken separately) and take 1/2 normal NPH dos the morning of the procedure.  Carry some sugar containing items with you to your appointment. 6. A driver must accompany you and be prepared to drive you home after your procedure.  7. Bring all your current medications with your. 8. An IV may be inserted and sedation may be given at the discretion of the physician.   9. A blood pressure cuff, EKG and other monitors will often be applied during the procedure.  Some patients may need to have extra oxygen administered for a short period. 10. You will be asked to provide medical information, including your allergies, prior to the procedure.  We must know immediately if you are taking blood thinners (like Coumadin/Warfarin)  Or if you are allergic to IV iodine contrast (dye). We must know if you could possible be pregnant.  Possible side-effects:  Bleeding from needle site  Infection (rare, may require surgery)  Nerve injury (rare)  Numbness & tingling (temporary)  Difficulty urinating (rare, temporary)  Spinal headache ( a headache worse with upright posture)  Light -headedness (temporary)  Pain at injection site (several days)  Decreased blood pressure (temporary)  Weakness in arm/leg (temporary)  Pressure sensation in back/neck (temporary)  Call if you experience:  Fever/chills associated with headache or increased back/neck pain.  Headache worsened by an upright position.  New onset weakness or numbness of an extremity below the injection site  Hives or difficulty breathing (go to  the emergency room)  Inflammation or drainage at the infection site  Severe back/neck pain  Any new symptoms which are concerning to you  Please note:  Although the local anesthetic injected can often make your back or neck feel good for several hours after the injection, the pain will likely return.  It takes 3-7 days for steroids to work in the epidural space.  You may not notice any pain relief for at least that one week.  If effective, we will often do a series of three injections spaced 3-6 weeks apart to maximally decrease your pain.  After the initial series, we generally will wait several months before considering a repeat injection of the same type.  If you have any questions, please call [phone] [place] were given on e prescription for Tramadol today.

## 2017-10-01 ENCOUNTER — Ambulatory Visit: Payer: 59 | Admitting: Pain Medicine

## 2017-10-29 ENCOUNTER — Ambulatory Visit
Admission: RE | Admit: 2017-10-29 | Discharge: 2017-10-29 | Disposition: A | Discharge status: Home / Self Care | Payer: 59 | Source: Ambulatory Visit | Attending: Pain Medicine | Admitting: Pain Medicine

## 2017-10-29 ENCOUNTER — Encounter: Payer: Self-pay | Admitting: Pain Medicine

## 2017-10-29 ENCOUNTER — Ambulatory Visit (HOSPITAL_BASED_OUTPATIENT_CLINIC_OR_DEPARTMENT_OTHER): Payer: 59 | Admitting: Pain Medicine

## 2017-10-29 ENCOUNTER — Other Ambulatory Visit: Payer: Self-pay

## 2017-10-29 VITALS — BP 138/90 | HR 71 | Temp 98.7°F | Resp 16 | Ht 70.0 in | Wt 200.0 lb

## 2017-10-29 DIAGNOSIS — M542 Cervicalgia: Secondary | ICD-10-CM | POA: Diagnosis present

## 2017-10-29 DIAGNOSIS — M5030 Other cervical disc degeneration, unspecified cervical region: Secondary | ICD-10-CM | POA: Diagnosis not present

## 2017-10-29 DIAGNOSIS — M47812 Spondylosis without myelopathy or radiculopathy, cervical region: Secondary | ICD-10-CM | POA: Insufficient documentation

## 2017-10-29 DIAGNOSIS — M25561 Pain in right knee: Secondary | ICD-10-CM | POA: Insufficient documentation

## 2017-10-29 DIAGNOSIS — Z79899 Other long term (current) drug therapy: Secondary | ICD-10-CM | POA: Diagnosis not present

## 2017-10-29 DIAGNOSIS — M25512 Pain in left shoulder: Secondary | ICD-10-CM | POA: Diagnosis not present

## 2017-10-29 DIAGNOSIS — G8929 Other chronic pain: Secondary | ICD-10-CM

## 2017-10-29 DIAGNOSIS — M4802 Spinal stenosis, cervical region: Secondary | ICD-10-CM | POA: Insufficient documentation

## 2017-10-29 MED ORDER — DEXAMETHASONE SODIUM PHOSPHATE 10 MG/ML IJ SOLN
10.0000 mg | Freq: Once | INTRAMUSCULAR | Status: AC
  Administered 2017-10-29: 10 mg
  Filled 2017-10-29: qty 1

## 2017-10-29 MED ORDER — IOPAMIDOL (ISOVUE-M 200) INJECTION 41%
10.0000 mL | Freq: Once | INTRAMUSCULAR | Status: AC
  Administered 2017-10-29: 10 mL via EPIDURAL
  Filled 2017-10-29: qty 10

## 2017-10-29 MED ORDER — SODIUM CHLORIDE 0.9% FLUSH
1.0000 mL | Freq: Once | INTRAVENOUS | Status: AC
  Administered 2017-10-29: 1 mL

## 2017-10-29 MED ORDER — ROPIVACAINE HCL 2 MG/ML IJ SOLN
1.0000 mL | Freq: Once | INTRAMUSCULAR | Status: AC
  Administered 2017-10-29: 1 mL via EPIDURAL
  Filled 2017-10-29: qty 10

## 2017-10-29 MED ORDER — LIDOCAINE HCL 2 % IJ SOLN
20.0000 mL | Freq: Once | INTRAMUSCULAR | Status: AC
  Administered 2017-10-29: 400 mg
  Filled 2017-10-29: qty 40

## 2017-10-29 NOTE — Progress Notes (Signed)
Patient's Name: Samuel Roberts  MRN: [id number]  Referring Provider: [name], MD  DOB: 11/21/1955  PCP: [name], MD  DOS: 10/29/2017  Note by: Gaspar Cola, MD  Service setting: Ambulatory outpatient  Specialty: Interventional Pain Management  Patient type: Established  Location: [facility] (AMB) Pain Management Facility  Visit type: Interventional Procedure   Primary Reason for Visit: Interventional Pain Management Treatment. CC: Neck Pain (left)  Procedure:          Anesthesia, Analgesia, Anxiolysis:  Type: Diagnostic, Inter-Laminar, Epidural Steroid Injection #2  Region: Posterior Cervico-thoracic Region Level: C7-T1 Laterality: Left-Sided Paramedial  Type: Local Anesthesia Indication(s): Analgesia         Route: Infiltration ([place]/IM) IV Access: Declined Sedation: Declined  Local Anesthetic: Lidocaine 1-2%   Indications: 1. DDD (degenerative disc disease), cervical   2. 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 Cervical spondylosis   4. Cervicalgia   5. Chronic neck pain  (Primary Area of Pain) (Bilateral) (L>R)   6. Chronic shoulder pain (Secondary Area of Pain) (Left)    Pain Score: Pre-procedure: 7 /10 Post-procedure: 0-No pain/10  Pre-op Assessment:  Samuel Roberts is a 62 y.o. (year old), male patient, seen today for interventional treatment. He  has a past surgical history that includes Knee arthroscopy and arthrotomy; Tonsillectomy; LEFT HEART CATH AND CORONARY ANGIOGRAPHY (Left, 09/09/2016); and Joint replacement (Right). Samuel Roberts has a current medication list which includes the following prescription(s): mesalamine, pantoprazole, and tramadol. His primarily concern today is the Neck Pain (left)  The patient indicates having more pain in the right knee and would like for [place] to begin treating that neck. He indicates having had problems with the right knee since he was age 62. He has had pins into the knee joint but claims that lately he has  been swelling more than usual. He has had steroid injections done by [facility] Surgeons who have recommended the possibility of knee replacement. He wants to try to extend the life of his current joint until December when his work load goes down. Today we spoke about the possibility of a series of 5 Hyalgan intra-articular knee injections versus a diagnostic genicular nerve blocks followed by possible radiofrequency ablation. I was cleared to him that these may provide him with some relief of the pain, but would not help any mechanical problems with the knee. He understood and accepted.  Initial Vital Signs:  Pulse/HCG Rate: 73  Temp: 98.7 F (37.1 C) Resp: 18 BP: (!) 122/103 SpO2: 97 %  BMI: Estimated body mass index is 28.7 kg/m as calculated from the following:   Height as of this encounter: 5' 10"  (1.778 m).   Weight as of this encounter: 200 lb (90.7 kg).  Risk Assessment: Allergies: Reviewed. He has No Known Allergies.  Allergy Precautions: None required Coagulopathies: Reviewed. None identified.  Blood-thinner therapy: None at this time Active Infection(s): Reviewed. None identified. Samuel Roberts is afebrile  Site Confirmation: Samuel Roberts was asked to confirm the procedure and laterality before marking the site Procedure checklist: Completed Consent: Before the procedure and under the influence of no sedative(s), amnesic(s), or anxiolytics, the patient was informed of the treatment options, risks and possible complications. To fulfill our ethical and legal obligations, as recommended by the American Medical Association's Code of Ethics, I have informed the patient of my clinical impression; the nature and purpose of the treatment or procedure; the risks, benefits, and possible complications of the intervention; the alternatives, including doing nothing;  the risk(s) and benefit(s) of the alternative treatment(s) or procedure(s); and the risk(s) and benefit(s) of doing nothing. The patient  was provided information about the general risks and possible complications associated with the procedure. These may include, but are not limited to: failure to achieve desired goals, infection, bleeding, organ or nerve damage, allergic reactions, paralysis, and death. In addition, the patient was informed of those risks and complications associated to Spine-related procedures, such as failure to decrease pain; infection (i.e.: Meningitis, epidural or intraspinal abscess); bleeding (i.e.: epidural hematoma, subarachnoid hemorrhage, or any other type of intraspinal or peri-dural bleeding); organ or nerve damage (i.e.: Any type of peripheral nerve, nerve root, or spinal cord injury) with subsequent damage to sensory, motor, and/or autonomic systems, resulting in permanent pain, numbness, and/or weakness of one or several areas of the body; allergic reactions; (i.e.: anaphylactic reaction); and/or death. Furthermore, the patient was informed of those risks and complications associated with the medications. These include, but are not limited to: allergic reactions (i.e.: anaphylactic or anaphylactoid reaction(s)); adrenal axis suppression; blood sugar elevation that in diabetics may result in ketoacidosis or comma; water retention that in patients with history of congestive heart failure may result in shortness of breath, pulmonary edema, and decompensation with resultant heart failure; weight gain; swelling or edema; medication-induced neural toxicity; particulate matter embolism and blood vessel occlusion with resultant organ, and/or nervous system infarction; and/or aseptic necrosis of one or more joints. Finally, the patient was informed that Medicine is not an exact science; therefore, there is also the possibility of unforeseen or unpredictable risks and/or possible complications that may result in a catastrophic outcome. The patient indicated having understood very clearly. We have given the patient no  guarantees and we have made no promises. Enough time was given to the patient to ask questions, all of which were answered to the patient's satisfaction. Samuel Roberts has indicated that he wanted to continue with the procedure. Attestation: I, the ordering provider, attest that I have discussed with the patient the benefits, risks, side-effects, alternatives, likelihood of achieving goals, and potential problems during recovery for the procedure that I have provided informed consent. Date  Time: 10/29/2017  1:58 PM  Pre-Procedure Preparation:  Monitoring: As per clinic protocol. Respiration, ETCO2, SpO2, BP, heart rate and rhythm monitor placed and checked for adequate function Safety Precautions: Patient was assessed for positional comfort and pressure points before starting the procedure. Time-out: I initiated and conducted the "Time-out" before starting the procedure, as per protocol. The patient was asked to participate by confirming the accuracy of the "Time Out" information. Verification of the correct person, site, and procedure were performed and confirmed by me, the nursing staff, and the patient. "Time-out" conducted as per Joint Commission's Universal Protocol (UP.01.01.01). Time: 1429  Description of Procedure:          Position: Prone with head of the table was raised to facilitate breathing. Target Area: For Epidural Steroid injections the target is the interlaminar space, initially targeting the lower border of the superior vertebral body lamina. Approach: Paramedial approach. Area Prepped: Entire PosteriorCervical Region Prepping solution: ChloraPrep (2% chlorhexidine gluconate and 70% isopropyl alcohol) Safety Precautions: Aspiration looking for blood return was conducted prior to all injections. At no point did we inject any substances, as a needle was being advanced. No attempts were made at seeking any paresthesias. Safe injection practices and needle disposal techniques used.  Medications properly checked for expiration dates. SDV (single dose vial) medications used. Description of the Procedure:  Protocol guidelines were followed. The procedure needle was introduced through the skin, ipsilateral to the reported pain, and advanced to the target area. Bone was contacted and the needle walked caudad, until the lamina was cleared. The epidural space was identified using "loss-of-resistance technique" with 2-3 ml of PF-NaCl (0.9% NSS), in a 5cc LOR glass syringe. Vitals:   10/29/17 1425 10/29/17 1430 10/29/17 1435 10/29/17 1441  BP: (!) 133/92 (!) 126/91 (!) 136/99 138/90  Pulse: 70 71 70 71  Resp: 16 18 16 16   Temp:      SpO2: 95% 97% 99% 100%  Weight:      Height:        Start Time: 1429 hrs. End Time: 1434 hrs. Materials:  Needle(s) Type: Epidural needle Gauge: 17G Length: 3.5-in Medication(s): Please see orders for medications and dosing details.  Imaging Guidance (Spinal):          Type of Imaging Technique: Fluoroscopy Guidance (Spinal) Indication(s): Assistance in needle guidance and placement for procedures requiring needle placement in or near specific anatomical locations not easily accessible without such assistance. Exposure Time: Please see nurses notes. Contrast: Before injecting any contrast, we confirmed that the patient did not have an allergy to iodine, shellfish, or radiological contrast. Once satisfactory needle placement was completed at the desired level, radiological contrast was injected. Contrast injected under live fluoroscopy. No contrast complications. See chart for type and volume of contrast used. Fluoroscopic Guidance: I was personally present during the use of fluoroscopy. "Tunnel Vision Technique" used to obtain the best possible view of the target area. Parallax error corrected before commencing the procedure. "Direction-depth-direction" technique used to introduce the needle under continuous pulsed fluoroscopy. Once target was  reached, antero-posterior, oblique, and lateral fluoroscopic projection used confirm needle placement in all planes. Images permanently stored in EMR. Interpretation: I personally interpreted the imaging intraoperatively. Adequate needle placement confirmed in multiple planes. Appropriate spread of contrast into desired area was observed. No evidence of afferent or efferent intravascular uptake. No intrathecal or subarachnoid spread observed. Permanent images saved into the patient's record.  Antibiotic Prophylaxis:   Anti-infectives (From admission, onward)   None     Indication(s): None identified  Post-operative Assessment:  Post-procedure Vital Signs:  Pulse/HCG Rate: 71  Temp: 98.7 F (37.1 C) Resp: 16 BP: 138/90 SpO2: 100 %  EBL: None  Complications: No immediate post-treatment complications observed by team, or reported by patient.  Note: The patient tolerated the entire procedure well. A repeat set of vitals were taken after the procedure and the patient was kept under observation following institutional policy, for this type of procedure. Post-procedural neurological assessment was performed, showing return to baseline, prior to discharge. The patient was provided with post-procedure discharge instructions, including a section on how to identify potential problems. Should any problems arise concerning this procedure, the patient was given instructions to immediately contact us, at any time, without hesitation. In any case, we plan to contact the patient by telephone for a follow-up status report regarding this interventional procedure.  Comments:  No additional relevant information.  Plan of Care    Imaging Orders     DG C-Arm 1-60 Min-No Report  Procedure Orders     Cervical Epidural Injection  Medications ordered for procedure: Meds ordered this encounter  Medications  . iopamidol (ISOVUE-M) 41 % intrathecal injection 10 mL    Must be Myelogram-compatible. If not  available, you may substitute with a water-soluble, non-ionic, hypoallergenic, myelogram-compatible radiological contrast medium.  Marland Kitchen lidocaine (XYLOCAINE)  2 % (with pres) injection 400 mg  . sodium chloride flush (NS) 0.9 % injection 1 mL  . ropivacaine (PF) 2 mg/mL (0.2%) (NAROPIN) injection 1 mL  . dexamethasone (DECADRON) injection 10 mg   Medications administered: We administered iopamidol, lidocaine, sodium chloride flush, ropivacaine (PF) 2 mg/mL (0.2%), and dexamethasone.  See the medical record for exact dosing, route, and time of administration.  New Prescriptions   No medications on file   Disposition: Discharge home  Discharge Date & Time: 10/29/2017; 1439 hrs.   Physician-requested Follow-up: Return for post-procedure eval (2 wks), w/ Dr. Dossie Arbour.  Future Appointments  Date Time Provider [place]  11/09/2017  2:00 PM Milinda Pointer, MD [facility] None  11/12/2017 12:45 PM Vevelyn Francois, NP [facility] None   Primary Care Physician: [name], MD Location: [facility] Outpatient [facility] Management Facility Note by: Gaspar Cola, MD Date: 10/29/2017; Time: 3:23 PM  Disclaimer:  Medicine is not an Chief Strategy Officer. The only guarantee in medicine is that nothing is guaranteed. It is important to note that the decision to proceed with this intervention was based on the information collected from the patient. The Data and conclusions were drawn from the patient's questionnaire, the interview, and the physical examination. Because the information was provided in large part by the patient, it cannot be guaranteed that it has not been purposely or unconsciously manipulated. Every effort has been made to obtain as much relevant data as possible for this evaluation. It is important to note that the conclusions that lead to this procedure are derived in large part from the available data. Always take into account that the treatment will also be dependent on availability of  resources and existing treatment guidelines, considered by other Pain Management Practitioners as being common knowledge and practice, at the time of the intervention. For Medico-Legal purposes, it is also important to point out that variation in procedural techniques and pharmacological choices are the acceptable norm. The indications, contraindications, technique, and results of the above procedure should only be interpreted and judged by a Board-Certified Interventional Pain Specialist with extensive familiarity and expertise in the same exact procedure and technique.

## 2017-10-29 NOTE — Patient Instructions (Addendum)
____________________________________________________________________________________________  Pain Scale  Introduction: The pain score used by this practice is the Verbal Numerical Rating Scale (VNRS-11). This is an 11-point scale. It is for adults and children 10 years or older. There are significant differences in how the pain score is reported, used, and applied. Forget everything you learned in the past and learn this scoring system.  General Information: The scale should reflect your current level of pain. Unless you are specifically asked for the level of your worst pain, or your average pain. If you are asked for one of these two, then it should be understood that it is over the past 24 hours.  Basic Activities of Daily Living (ADL): Personal hygiene, dressing, eating, transferring, and using restroom.  Instructions: Most patients tend to report their level of pain as a combination of two factors, their physical pain and their psychosocial pain. This last one is also known as "suffering" and it is reflection of how physical pain affects you socially and psychologically. From now on, report them separately. From this point on, when asked to report your pain level, report only your physical pain. Use the following table for reference.  Pain Clinic Pain Levels (0-5/10)  Pain Level Score  Description  No Pain 0   Mild pain 1 Nagging, annoying, but does not interfere with basic activities of daily living (ADL). Patients are able to eat, bathe, get dressed, toileting (being able to get on and off the toilet and perform personal hygiene functions), transfer (move in and out of bed or a chair without assistance), and maintain continence (able to control bladder and bowel functions). Blood pressure and heart rate are unaffected. A normal heart rate for a healthy adult ranges from 60 to 100 bpm (beats per minute).   Mild to moderate pain 2 Noticeable and distracting. Impossible to hide from other  people. More frequent flare-ups. Still possible to adapt and function close to normal. It can be very annoying and may have occasional stronger flare-ups. With discipline, patients may get used to it and adapt.   Moderate pain 3 Interferes significantly with activities of daily living (ADL). It becomes difficult to feed, bathe, get dressed, get on and off the toilet or to perform personal hygiene functions. Difficult to get in and out of bed or a chair without assistance. Very distracting. With effort, it can be ignored when deeply involved in activities.   Moderately severe pain 4 Impossible to ignore for more than a few minutes. With effort, patients may still be able to manage work or participate in some social activities. Very difficult to concentrate. Signs of autonomic nervous system discharge are evident: dilated pupils (mydriasis); mild sweating (diaphoresis); sleep interference. Heart rate becomes elevated (>115 bpm). Diastolic blood pressure (lower number) rises above 100 mmHg. Patients find relief in laying down and not moving.   Severe pain 5 Intense and extremely unpleasant. Associated with frowning face and frequent crying. Pain overwhelms the senses.  Ability to do any activity or maintain social relationships becomes significantly limited. Conversation becomes difficult. Pacing back and forth is common, as getting into a comfortable position is nearly impossible. Pain wakes you up from deep sleep. Physical signs will be obvious: pupillary dilation; increased sweating; goosebumps; brisk reflexes; cold, clammy hands and feet; nausea, vomiting or dry heaves; loss of appetite; significant sleep disturbance with inability to fall asleep or to remain asleep. When persistent, significant weight loss is observed due to the complete loss of appetite and sleep deprivation.  Blood  pressure and heart rate becomes significantly elevated. Caution: If elevated blood pressure triggers a pounding headache  associated with blurred vision, then the patient should immediately seek attention at an urgent or emergency care unit, as these may be signs of an impending stroke.    Emergency Department Pain Levels (6-10/10)  Emergency Room Pain 6 Severely limiting. Requires emergency care and should not be seen or managed at an outpatient pain management facility. Communication becomes difficult and requires great effort. Assistance to reach the emergency department may be required. Facial flushing and profuse sweating along with potentially dangerous increases in heart rate and blood pressure will be evident.   Distressing pain 7 Self-care is very difficult. Assistance is required to transport, or use restroom. Assistance to reach the emergency department will be required. Tasks requiring coordination, such as bathing and getting dressed become very difficult.   Disabling pain 8 Self-care is no longer possible. At this level, pain is disabling. The individual is unable to do even the most "basic" activities such as walking, eating, bathing, dressing, transferring to a bed, or toileting. Fine motor skills are lost. It is difficult to think clearly.   Incapacitating pain 9 Pain becomes incapacitating. Thought processing is no longer possible. Difficult to remember your own name. Control of movement and coordination are lost.   The worst pain imaginable 10 At this level, most patients pass out from pain. When this level is reached, collapse of the autonomic nervous system occurs, leading to a sudden drop in blood pressure and heart rate. This in turn results in a temporary and dramatic drop in blood flow to the brain, leading to a loss of consciousness. Fainting is one of the body's self defense mechanisms. Passing out puts the brain in a calmed state and causes it to shut down for a while, in order to begin the healing process.    Summary: 1. Refer to this scale when providing [place] with your pain level. 2. Be  accurate and careful when reporting your pain level. This will help with your care. 3. Over-reporting your pain level will lead to loss of credibility. 4. Even a level of 1/10 means that there is pain and will be treated at our facility. 5. High, inaccurate reporting will be documented as "Symptom Exaggeration", leading to loss of credibility and suspicions of possible secondary gains such as obtaining more narcotics, or wanting to appear disabled, for fraudulent reasons. 6. Only pain levels of 5 or below will be seen at our facility. 7. Pain levels of 6 and above will be sent to the Emergency Department and the appointment cancelled. ____________________________________________________________________________________________   ____________________________________________________________________________________________  Post-Procedure Discharge Instructions  Instructions:  Apply ice: Fill a plastic sandwich bag with crushed ice. Cover it with a small towel and apply to injection site. Apply for 15 minutes then remove x 15 minutes. Repeat sequence on day of procedure, until you go to bed. The purpose is to minimize swelling and discomfort after procedure.  Apply heat: Apply heat to procedure site starting the day following the procedure. The purpose is to treat any soreness and discomfort from the procedure.  Food intake: Start with clear liquids (like water) and advance to regular food, as tolerated.   Physical activities: Keep activities to a minimum for the first 8 hours after the procedure.   Driving: If you have received any sedation, you are not allowed to drive for 24 hours after your procedure.  Blood thinner: Restart your blood thinner 6 hours after  your procedure. (Only for those taking blood thinners)  Insulin: As soon as you can eat, you may resume your normal dosing schedule. (Only for those taking insulin)  Infection prevention: Keep procedure site clean and  dry.  Post-procedure Pain Diary: Extremely important that this be done correctly and accurately. Recorded information will be used to determine the next step in treatment.  Pain evaluated is that of treated area only. Do not include pain from an untreated area.  Complete every hour, on the hour, for the initial 8 hours. Set an alarm to help you do this part accurately.  Do not go to sleep and have it completed later. It will not be accurate.  Follow-up appointment: Keep your follow-up appointment after the procedure. Usually 2 weeks for most procedures. (6 weeks in the case of radiofrequency.) Bring you pain diary.   Expect:  From numbing medicine (AKA: Local Anesthetics): Numbness or decrease in pain.  Onset: Full effect within 15 minutes of injected.  Duration: It will depend on the type of local anesthetic used. On the average, 1 to 8 hours.   From steroids: Decrease in swelling or inflammation. Once inflammation is improved, relief of the pain will follow.  Onset of benefits: Depends on the amount of swelling present. The more swelling, the longer it will take for the benefits to be seen. In some cases, up to 10 days.  Duration: Steroids will stay in the system x 2 weeks. Duration of benefits will depend on multiple posibilities including persistent irritating factors.  From procedure: Some discomfort is to be expected once the numbing medicine wears off. This should be minimal if ice and heat are applied as instructed.  Call if:  You experience numbness and weakness that gets worse with time, as opposed to wearing off.  New onset bowel or bladder incontinence. (This applies to Spinal procedures only)  Emergency Numbers:  Durning business hours (Monday - Thursday, 8:00 AM - 4:00 PM) (Friday, 9:00 AM - 12:00 Noon): [phone]  After hours: [phone] ____________________________________________________________________________________________   Pain Management  Discharge Instructions  General Discharge Instructions :  If you need to reach your doctor call: Monday-Friday 8:00 am - 4:00 pm at [phone] or toll free [phone].  After clinic hours [phone] to have operator reach doctor.  Bring all of your medication bottles to all your appointments in the pain clinic.  To cancel or reschedule your appointment with Pain Management please remember to call 24 hours in advance to avoid a fee.  Refer to the educational materials which you have been given on: General Risks, I had my Procedure. Discharge Instructions, Post Sedation.  Post Procedure Instructions:  The drugs you were given will stay in your system until tomorrow, so for the next 24 hours you should not drive, make any legal decisions or drink any alcoholic beverages.  You may eat anything you prefer, but it is better to start with liquids then soups and crackers, and gradually work up to solid foods.  Please notify your doctor immediately if you have any unusual bleeding, trouble breathing or pain that is not related to your normal pain.  Depending on the type of procedure that was done, some parts of your body may feel week and/or numb.  This usually clears up by tonight or the next day.  Walk with the use of an assistive device or accompanied by an adult for the 24 hours.  You may use ice on the affected area for the first 24  hours.  Put ice in a Ziploc bag and cover with a towel and place against area 15 minutes on 15 minutes off.  You may switch to heat after 24 hours.

## 2017-10-30 ENCOUNTER — Telehealth: Payer: Self-pay

## 2017-10-30 NOTE — Telephone Encounter (Signed)
Post procedure phone call.  Patient states he is doing very well this morning.

## 2017-11-09 ENCOUNTER — Ambulatory Visit: Payer: 59 | Admitting: Pain Medicine

## 2017-11-09 NOTE — Progress Notes (Deleted)
Patient's Name: Samuel Roberts  MRN: [id number]  Referring Provider: [name], MD  DOB: March 13, 1956  PCP: Samuel Pitch, MD  DOS: 11/09/2017  Note by: Gaspar Cola, MD  Service setting: Ambulatory outpatient  Specialty: Interventional Pain Management  Location: [facility] (AMB) Pain Management Facility    Patient type: Established   Primary Reason(s) for Visit: Encounter for post-procedure evaluation of chronic illness with mild to moderate exacerbation CC: No chief complaint on file.  HPI  Samuel Roberts is a 62 y.o. year old, male patient, who comes today for a post-procedure evaluation. He has Angina pectoris ([place]); Crohn's colitis, with rectal bleeding ([place]); BPH (benign prostatic hyperplasia); Carcinoma in situ of prostate; Elevated prostate specific antigen (PSA); Family history of malignant neoplasm of prostate; Arthropathy of knee (Right); Enlarged prostate with lower urinary tract symptoms (LUTS); Chronic neck pain  (Primary Area of Pain) (Bilateral) (L>R); Occipital neuralgia (Tertiary Area of Pain) (Left); Chronic shoulder pain (Secondary Area of Pain) (Left); Chronic low back pain (Fourth Area of Pain) (Right) without sciatica; Chronic knee pain (Fifth Area of Pain) (Bilateral) (R>L); Chronic pain syndrome; Long term current use of opiate analgesic; Pharmacologic therapy; Disorder of skeletal system; Problems influencing health status; Pain of left humerus; Chronic upper extremity pain (Left); DDD (degenerative disc disease), cervical; Cervical spondylosis; 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 His primarily concern today is the No chief complaint on file.  Pain Assessment: Location:     Radiating:   Onset:   Duration:   Quality:   Severity:  /10 (subjective, self-reported pain score)  Note: Reported level is compatible with observation.                          When using our objective Pain Scale, levels between 6 and 10/10 are said to belong in an emergency room, as it progressively worsens from a 6/10, described as severely limiting, requiring emergency care not usually available at an outpatient pain management facility. At a 6/10 level, communication becomes difficult and requires great effort. Assistance to reach the emergency department may be required. Facial flushing and profuse sweating along with potentially dangerous increases in heart rate and blood pressure will be evident. Effect on ADL:   Timing:   Modifying factors:   BP:    HR:    Samuel Roberts comes in today for post-procedure evaluation after the treatment done on 10/30/2017.  Further details on both, my assessment(s), as well as the proposed treatment plan, please see below.  Post-Procedure Assessment  10/29/2017 Procedure: Diagnostic/therapeutic left-sided Cervical ESI #2 under fluoroscopic guidance, no sedation Pre-procedure pain score:  7/10 Post-procedure pain score: 0/10 (100% relief) Influential Factors: BMI:   Intra-procedural challenges: None observed.         Assessment challenges: None detected.              Reported side-effects: None.        Post-procedural adverse reactions or complications: None reported         Sedation: No sedation used. When no sedatives are used, the analgesic levels obtained are directly associated to the effectiveness of the local anesthetics. However, when sedation is provided, the level of analgesia obtained during the initial 1 hour following the intervention, is believed to be the result of a combination of factors. These factors may include, but are not limited to: 1. The effectiveness of  the local anesthetics used. 2. The effects of the analgesic(s) and/or anxiolytic(s) used. 3. The degree of discomfort experienced by the patient at the time of the procedure. 4. The patients ability and reliability in recalling and  recording the events. 5. The presence and influence of possible secondary gains and/or psychosocial factors. Reported result: Relief experienced during the 1st hour after the procedure:   (Ultra-Short Term Relief)            Interpretative annotation: Clinically appropriate result. No IV Analgesic or Anxiolytic given, therefore benefits are completely due to Local Anesthetic effects.          Effects of local anesthetic: The analgesic effects attained during this period are directly associated to the localized infiltration of local anesthetics and therefore cary significant diagnostic value as to the etiological location, or anatomical origin, of the pain. Expected duration of relief is directly dependent on the pharmacodynamics of the local anesthetic used. Long-acting (4-6 hours) anesthetics used.  Reported result: Relief during the next 4 to 6 hour after the procedure:   (Short-Term Relief)            Interpretative annotation: Clinically appropriate result. Analgesia during this period is likely to be Local Anesthetic-related.          Long-term benefit: Defined as the period of time past the expected duration of local anesthetics (1 hour for short-acting and 4-6 hours for long-acting). With the possible exception of prolonged sympathetic blockade from the local anesthetics, benefits during this period are typically attributed to, or associated with, other factors such as analgesic sensory neuropraxia, antiinflammatory effects, or beneficial biochemical changes provided by agents other than the local anesthetics.  Reported result: Extended relief following procedure:   (Long-Term Relief)            Interpretative annotation: Clinically appropriate result. Good relief. No permanent benefit expected. Inflammation plays a part in the etiology to the pain.          Current benefits: Defined as reported results that persistent at this point in time.   Analgesia: *** %            Function: Somewhat  improved ROM: Somewhat improved Interpretative annotation: Recurrence of symptoms. No permanent benefit expected. Effective diagnostic intervention.          Interpretation: Results would suggest a successful diagnostic intervention.                  Plan:  Please see "Plan of Care" for details.                Laboratory Chemistry  Inflammation Markers (CRP: Acute Phase) (ESR: Chronic Phase) Lab Results  Component Value Date   CRP 2.8 06/24/2017   ESRSEDRATE 11 06/24/2017                         Renal Markers Lab Results  Component Value Date   BUN 17 09/06/2016   CREATININE 0.73 09/06/2016   GFRAA >60 09/06/2016   GFRNONAA >60 09/06/2016                             Hepatic Markers Lab Results  Component Value Date   AST 27 09/05/2016   ALT 15 (L) 09/05/2016   ALBUMIN 4.1 09/05/2016  Neuropathy Markers No results found for: HGBA1C, HIV                      Hematology Parameters Lab Results  Component Value Date   PLT 306 09/06/2016   HGB 14.1 09/06/2016   HCT 40.7 09/06/2016                        CV Markers Lab Results  Component Value Date   TROPONINI <0.03 09/06/2016                         Note: Lab results reviewed.  Recent Diagnostic Imaging Results  DG C-Arm 1-60 Min-No Report Fluoroscopy was utilized by the requesting physician.  No radiographic  interpretation.   Complexity Note: I personally reviewed the fluoroscopic imaging of the procedure.                        Meds   Current Outpatient Medications:  .  mesalamine (LIALDA) 1.2 g EC tablet, Take 1.2 g by mouth 2 (two) times daily., Disp: , Rfl:  .  pantoprazole (PROTONIX) 40 MG tablet, Take 40 mg by mouth every other day. , Disp: , Rfl: 0 .  traMADol (ULTRAM) 50 MG tablet, Take 1 tablet (50 mg total) by mouth 2 (two) times daily., Disp: 60 tablet, Rfl: 2  ROS  Constitutional: Denies any fever or chills Gastrointestinal: No reported hemesis, hematochezia, vomiting,  or acute GI distress Musculoskeletal: Denies any acute onset joint swelling, redness, loss of ROM, or weakness Neurological: No reported episodes of acute onset apraxia, aphasia, dysarthria, agnosia, amnesia, paralysis, loss of coordination, or loss of consciousness  Allergies  Mr. Boursiquot has No Known Allergies.  [place]  Drug: Mr. Kinnett  reports that he does not use drugs. Alcohol:  reports that he does not drink alcohol. Tobacco:  reports that he has never smoked. He has never used smokeless tobacco. Medical:  has a past medical history of BPH (benign prostatic hyperplasia) and Colitis, ulcerative ([place]). Surgical: Mr. Bondy  has a past surgical history that includes Knee arthroscopy and arthrotomy; Tonsillectomy; LEFT HEART CATH AND CORONARY ANGIOGRAPHY (Left, 09/09/2016); and Joint replacement (Right). Family: family history includes Cancer in his father; Cirrhosis in his brother; Diabetes in his mother, sister, and sister; Drug abuse in his brother and brother; Heart disease in his brother and brother; Heart failure in his father and mother.  Constitutional Exam  General appearance: Well nourished, well developed, and well hydrated. In no apparent acute distress There were no vitals filed for this visit. BMI Assessment: Estimated body mass index is 28.7 kg/m as calculated from the following:   Height as of 10/29/17: 5' 10"  (1.778 m).   Weight as of 10/29/17: 200 lb (90.7 kg).  BMI interpretation table: BMI level Category Range association with higher incidence of chronic pain  <18 kg/m2 Underweight   18.5-24.9 kg/m2 Ideal body weight   25-29.9 kg/m2 Overweight Increased incidence by 20%  30-34.9 kg/m2 Obese (Class I) Increased incidence by 68%  35-39.9 kg/m2 Severe obesity (Class II) Increased incidence by 136%  >40 kg/m2 Extreme obesity (Class III) Increased incidence by 254%   Patient's current BMI Ideal Body weight  There is no height or weight on file to calculate BMI. Ideal body  weight: 73 kg (160 lb 15 oz) Adjusted ideal body weight: 80.1 kg (176 lb 9 oz)   BMI  Readings from Last 4 Encounters:  10/29/17 28.70 kg/m  08/13/17 28.70 kg/m  08/05/17 28.70 kg/m  07/14/17 28.70 kg/m   Wt Readings from Last 4 Encounters:  10/29/17 200 lb (90.7 kg)  08/13/17 200 lb (90.7 kg)  08/05/17 200 lb (90.7 kg)  07/14/17 200 lb (90.7 kg)  Psych/Mental status: Alert, oriented x 3 (person, place, & time)       Eyes: PERLA Respiratory: No evidence of acute respiratory distress  Cervical Spine Area Exam  Skin & Axial Inspection: No masses, redness, edema, swelling, or associated skin lesions Alignment: Symmetrical Functional ROM: Unrestricted ROM      Stability: No instability detected Muscle Tone/Strength: Functionally intact. No obvious neuro-muscular anomalies detected. Sensory (Neurological): Unimpaired Palpation: No palpable anomalies              Upper Extremity (UE) Exam    Side: Right upper extremity  Side: Left upper extremity  Skin & Extremity Inspection: Skin color, temperature, and hair growth are WNL. No peripheral edema or cyanosis. No masses, redness, swelling, asymmetry, or associated skin lesions. No contractures.  Skin & Extremity Inspection: Skin color, temperature, and hair growth are WNL. No peripheral edema or cyanosis. No masses, redness, swelling, asymmetry, or associated skin lesions. No contractures.  Functional ROM: Unrestricted ROM          Functional ROM: Unrestricted ROM          Muscle Tone/Strength: Functionally intact. No obvious neuro-muscular anomalies detected.  Muscle Tone/Strength: Functionally intact. No obvious neuro-muscular anomalies detected.  Sensory (Neurological): Unimpaired          Sensory (Neurological): Unimpaired          Palpation: No palpable anomalies              Palpation: No palpable anomalies              Provocative Test(s):  Phalen's test: deferred Tinel's test: deferred Apley's scratch test (touch opposite  shoulder):  Action 1 (Across chest): deferred Action 2 (Overhead): deferred Action 3 (LB reach): deferred   Provocative Test(s):  Phalen's test: deferred Tinel's test: deferred Apley's scratch test (touch opposite shoulder):  Action 1 (Across chest): deferred Action 2 (Overhead): deferred Action 3 (LB reach): deferred    Thoracic Spine Area Exam  Skin & Axial Inspection: No masses, redness, or swelling Alignment: Symmetrical Functional ROM: Unrestricted ROM Stability: No instability detected Muscle Tone/Strength: Functionally intact. No obvious neuro-muscular anomalies detected. Sensory (Neurological): Unimpaired Muscle strength & Tone: No palpable anomalies  Lumbar Spine Area Exam  Skin & Axial Inspection: No masses, redness, or swelling Alignment: Symmetrical Functional ROM: Unrestricted ROM       Stability: No instability detected Muscle Tone/Strength: Functionally intact. No obvious neuro-muscular anomalies detected. Sensory (Neurological): Unimpaired Palpation: No palpable anomalies       Provocative Tests: Lumbar Hyperextension/rotation test: deferred today       Lumbar quadrant test (Kemp's test): deferred today       Lumbar Lateral bending test: deferred today       Patrick's Maneuver: deferred today                   FABER test: deferred today                   Thigh-thrust test: deferred today       S-I compression test: deferred today       S-I distraction test: deferred today        Gait &  Posture Assessment  Ambulation: Unassisted Gait: Relatively normal for age and body habitus Posture: WNL   Lower Extremity Exam    Side: Right lower extremity  Side: Left lower extremity  Stability: No instability observed          Stability: No instability observed          Skin & Extremity Inspection: Skin color, temperature, and hair growth are WNL. No peripheral edema or cyanosis. No masses, redness, swelling, asymmetry, or associated skin lesions. No contractures.   Skin & Extremity Inspection: Skin color, temperature, and hair growth are WNL. No peripheral edema or cyanosis. No masses, redness, swelling, asymmetry, or associated skin lesions. No contractures.  Functional ROM: Unrestricted ROM                  Functional ROM: Unrestricted ROM                  Muscle Tone/Strength: Functionally intact. No obvious neuro-muscular anomalies detected.  Muscle Tone/Strength: Functionally intact. No obvious neuro-muscular anomalies detected.  Sensory (Neurological): Unimpaired  Sensory (Neurological): Unimpaired  Palpation: No palpable anomalies  Palpation: No palpable anomalies   Assessment  Primary Diagnosis & Pertinent Problem List: The primary encounter diagnosis was Chronic neck pain  (Primary Area of Pain) (Bilateral) (L>R). Diagnoses of Chronic shoulder pain (Secondary Area of Pain) (Left), Occipital neuralgia (Tertiary Area of Pain) (Left), Chronic low back pain (Fourth Area of Pain) (Right) without sciatica, Chronic knee pain (Fifth Area of Pain) (Bilateral) (R>L), and Chronic pain syndrome were also pertinent to this visit.  Status Diagnosis  Controlled Controlled Controlled 1. Chronic neck pain  (Primary Area of Pain) (Bilateral) (L>R)   2. Chronic shoulder pain (Secondary Area of Pain) (Left)   3. Occipital neuralgia (Tertiary Area of Pain) (Left)   4. Chronic low back pain (Fourth Area of Pain) (Right) without sciatica   5. Chronic knee pain (Fifth Area of Pain) (Bilateral) (R>L)   6. Chronic pain syndrome     Problems updated and reviewed during this visit: No problems updated. Plan of Care  Pharmacotherapy (Medications Ordered): No orders of the defined types were placed in this encounter.  Medications administered today: Vangie Bicker. Godlewski had no medications administered during this visit.  Procedure Orders    No procedure(s) ordered today   Lab Orders  No laboratory test(s) ordered today   Imaging Orders  No imaging studies ordered  today   Referral Orders  No referral(s) requested today   Interventional management options: Planned, scheduled, and/or pending: Diagnostic left-sided cervical epidural steroid injection#3 to be reschedule pt to call   Considering: Diagnostic left-sided cervical facet nerve block  Possible left-sided cervical facet RFA  Diagnosticleft suprascapular nerve block  Diagnostictrigger point injection  Diagnosticright lumbar facet nerve block  Diagnostic right lumbar facet RFA  Diagnostic Left knee Hyalgan series  Diagnostic Right knee genicular nerve block  Possible right knee genicular RFA    PRN Procedures: None at this time   Provider-requested follow-up: No follow-ups on file.  Future Appointments  Date Time Provider [place]  11/09/2017  2:00 PM Milinda Pointer, MD [facility] None  11/12/2017 12:45 PM Vevelyn Francois, NP [facility] None   Primary Care Physician: Samuel Pitch, MD Location: [facility] Note by: Gaspar Cola, MD Date: 11/09/2017; Time: 7:35 AM

## 2017-11-12 ENCOUNTER — Encounter: Payer: Self-pay | Admitting: Nurse Practitioner

## 2017-11-12 ENCOUNTER — Other Ambulatory Visit: Payer: Self-pay

## 2017-11-12 ENCOUNTER — Ambulatory Visit: Payer: 59 | Attending: Nurse Practitioner | Admitting: Nurse Practitioner

## 2017-11-12 VITALS — BP 131/89 | HR 70 | Temp 98.0°F | Resp 16 | Ht 70.0 in | Wt 200.0 lb

## 2017-11-12 DIAGNOSIS — M4802 Spinal stenosis, cervical region: Secondary | ICD-10-CM | POA: Insufficient documentation

## 2017-11-12 DIAGNOSIS — Z8546 Personal history of malignant neoplasm of prostate: Secondary | ICD-10-CM | POA: Insufficient documentation

## 2017-11-12 DIAGNOSIS — M545 Low back pain: Secondary | ICD-10-CM | POA: Diagnosis not present

## 2017-11-12 DIAGNOSIS — K50111 Crohn's disease of large intestine with rectal bleeding: Secondary | ICD-10-CM | POA: Diagnosis not present

## 2017-11-12 DIAGNOSIS — M25562 Pain in left knee: Secondary | ICD-10-CM | POA: Insufficient documentation

## 2017-11-12 DIAGNOSIS — M25561 Pain in right knee: Secondary | ICD-10-CM | POA: Insufficient documentation

## 2017-11-12 DIAGNOSIS — G894 Chronic pain syndrome: Secondary | ICD-10-CM | POA: Diagnosis not present

## 2017-11-12 DIAGNOSIS — M542 Cervicalgia: Secondary | ICD-10-CM

## 2017-11-12 DIAGNOSIS — M9981 Other biomechanical lesions of cervical region: Secondary | ICD-10-CM | POA: Diagnosis not present

## 2017-11-12 DIAGNOSIS — M25512 Pain in left shoulder: Secondary | ICD-10-CM | POA: Insufficient documentation

## 2017-11-12 DIAGNOSIS — M5030 Other cervical disc degeneration, unspecified cervical region: Secondary | ICD-10-CM | POA: Insufficient documentation

## 2017-11-12 DIAGNOSIS — M5481 Occipital neuralgia: Secondary | ICD-10-CM | POA: Insufficient documentation

## 2017-11-12 DIAGNOSIS — N400 Enlarged prostate without lower urinary tract symptoms: Secondary | ICD-10-CM | POA: Insufficient documentation

## 2017-11-12 DIAGNOSIS — M170 Bilateral primary osteoarthritis of knee: Secondary | ICD-10-CM | POA: Diagnosis not present

## 2017-11-12 DIAGNOSIS — M47812 Spondylosis without myelopathy or radiculopathy, cervical region: Secondary | ICD-10-CM | POA: Diagnosis not present

## 2017-11-12 MED ORDER — TRAMADOL HCL 50 MG PO TABS: 50.0000 mg | ORAL_TABLET | Freq: Two times a day (BID) | ORAL | 2 refills | Status: DC

## 2017-11-12 NOTE — Patient Instructions (Addendum)
____________________________________________________________________________________________  Medication Rules  Applies to: All patients receiving prescriptions (written or electronic).  Pharmacy of record: Pharmacy where electronic prescriptions will be sent. If written prescriptions are taken to a different pharmacy, please inform the nursing staff. The pharmacy listed in the electronic medical record should be the one where you would like electronic prescriptions to be sent.  Prescription refills: Only during scheduled appointments. Applies to both, written and electronic prescriptions.  NOTE: The following applies primarily to controlled substances (Opioid* Pain Medications).   Patient's responsibilities: 1. Pain Pills: Bring all pain pills to every appointment (except for procedure appointments). 2. Pill Bottles: Bring pills in original pharmacy bottle. Always bring newest bottle. Bring bottle, even if empty. 3. Medication refills: You are responsible for knowing and keeping track of what medications you need refilled. The day before your appointment, write a list of all prescriptions that need to be refilled. Bring that list to your appointment and give it to the admitting nurse. Prescriptions will be written only during appointments. If you forget a medication, it will not be "Called in", "Faxed", or "electronically sent". You will need to get another appointment to get these prescribed. 4. Prescription Accuracy: You are responsible for carefully inspecting your prescriptions before leaving our office. Have the discharge nurse carefully go over each prescription with you, before taking them home. Make sure that your name is accurately spelled, that your address is correct. Check the name and dose of your medication to make sure it is accurate. Check the number of pills, and the written instructions to make sure they are clear and accurate. Make sure that you are given enough medication to last  until your next medication refill appointment. 5. Taking Medication: Take medication as prescribed. Never take more pills than instructed. Never take medication more frequently than prescribed. Taking less pills or less frequently is permitted and encouraged, when it comes to controlled substances (written prescriptions).  6. Inform other Doctors: Always inform, all of your healthcare providers, of all the medications you take. 7. Pain Medication from other Providers: You are not allowed to accept any additional pain medication from any other Doctor or Healthcare provider. There are two exceptions to this rule. (see below) In the event that you require additional pain medication, you are responsible for notifying us, as stated below. 8. Medication Agreement: You are responsible for carefully reading and following our Medication Agreement. This must be signed before receiving any prescriptions from our practice. Safely store a copy of your signed Agreement. Violations to the Agreement will result in no further prescriptions. (Additional copies of our Medication Agreement are available upon request.) 9. Laws, Rules, & Regulations: All patients are expected to follow all Federal and [employer], [employer], Rules, [employer]. Ignorance of the Laws does not constitute a valid excuse. The use of any illegal substances is prohibited. 10. Adopted CDC guidelines & recommendations: Target dosing levels will be at or below 60 MME/day. Use of benzodiazepines** is not recommended.  Exceptions: There are only two exceptions to the rule of not receiving pain medications from other Healthcare Providers. 1. Exception #1 (Emergencies): In the event of an emergency (i.e.: accident requiring emergency care), you are allowed to receive additional pain medication. However, you are responsible for: As soon as you are able, call our office [phone], at any time of the day or night, and leave a message stating your name, the  date and nature of the emergency, and the name and dose of the medication  prescribed. In the event that your call is answered by a member of our staff, make sure to document and save the date, time, and the name of the person that took your information.  2. Exception #2 (Planned Surgery): In the event that you are scheduled by another doctor or dentist to have any type of surgery or procedure, you are allowed (for a period no longer than 30 days), to receive additional pain medication, for the acute post-op pain. However, in this case, you are responsible for picking up a copy of our "Post-op Pain Management for Surgeons" handout, and giving it to your surgeon or dentist. This document is available at our office, and does not require an appointment to obtain it. Simply go to our office during business hours (Monday-Thursday from 8:00 AM to 4:00 PM) (Friday 8:00 AM to 12:00 Noon) or if you have a scheduled appointment with [place], prior to your surgery, and ask for it by name. In addition, you will need to provide [place] with your name, name of your surgeon, type of surgery, and date of procedure or surgery.  *Opioid medications include: morphine, codeine, oxycodone, oxymorphone, hydrocodone, hydromorphone, meperidine, tramadol, tapentadol, buprenorphine, fentanyl, methadone. **Benzodiazepine medications include: diazepam (Valium), alprazolam (Xanax), clonazepam (Klonopine), lorazepam (Ativan), clorazepate (Tranxene), chlordiazepoxide (Librium), estazolam (Prosom), oxazepam (Serax), temazepam (Restoril), triazolam (Halcion) (Last updated: 06/18/2017) ____________________________________________________________________________________________ Samuel Roberts were given one prescription for Tramadol.

## 2017-11-12 NOTE — Progress Notes (Signed)
Nursing Pain Medication Assessment:  Safety precautions to be maintained throughout the outpatient stay will include: orient to surroundings, keep bed in low position, maintain call bell within reach at all times, provide assistance with transfer out of bed and ambulation.  Medication Inspection Compliance: Pill count conducted under aseptic conditions, in front of the patient. Neither the pills nor the bottle was removed from the patient's sight at any time. Once count was completed pills were immediately returned to the patient in their original bottle.  Medication: Tramadol (Ultram) Pill/Patch Count: 8 of 60 pills remain Pill/Patch Appearance: Markings consistent with prescribed medication Bottle Appearance: Standard pharmacy container. Clearly labeled. Filled Date: 06/29 / 2018 Last Medication intake:  Today

## 2017-11-12 NOTE — Progress Notes (Addendum)
Patient's Name: Samuel Roberts  MRN: [id number]  Referring Provider: [name], MD  DOB: [birth year]  PCP: [name], MD  DOS: 11/12/2017  Note by: Vevelyn Francois NP  Service setting: Ambulatory outpatient  Specialty: Interventional Pain Management  Location: [facility] (AMB) Pain Management Facility    Patient type: Established    Primary Reason(s) for Visit: Encounter for prescription drug management & post-procedure evaluation of chronic illness with mild to moderate exacerbation(Level of risk: moderate) CC: Neck Pain (left)  HPI  Samuel Roberts is a 62 y.o. year old, male patient, who comes today for a post-procedure evaluation and medication management. He has Angina pectoris ([place]); Crohn's colitis, with rectal bleeding ([place]); BPH (benign prostatic hyperplasia); Carcinoma in situ of prostate; Elevated prostate specific antigen (PSA); Family history of malignant neoplasm of prostate; Arthropathy of knee (Right); Enlarged prostate with lower urinary tract symptoms (LUTS); Chronic neck pain  (Primary Area of Pain) (Bilateral) (L>R); Occipital neuralgia (Tertiary Area of Pain) (Left); Chronic shoulder pain (Secondary Area of Pain) (Left); Chronic low back pain (Fourth Area of Pain) (Right) without sciatica; Chronic knee pain (Fifth Area of Pain) (Bilateral) (R>L); Chronic pain syndrome; Long term current use of opiate analgesic; Pharmacologic therapy; Disorder of skeletal system; Problems influencing health status; Pain of left humerus; Chronic upper extremity pain (Left); DDD (degenerative disc disease), cervical; Cervical spondylosis; 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 His primarily concern today is the Neck Pain (left)  Pain Assessment: Location: Left Neck Radiating: denies Onset: More than a month ago Duration: Chronic pain Quality: Aching Severity:  1 /10 (subjective, self-reported pain score)  Note: Reported level is compatible with observation.                          Effect on ADL:   Timing: Intermittent Modifying factors: being still, hot shower, stretching, ice, heat BP: 131/89  HR: 70  [name] was last seen on 10/30/2017 for a procedure. During today's appointment we reviewed Samuel Roberts's post-procedure results, as well as his outpatient medication regimen.  Further details on both, my assessment(s), as well as the proposed treatment plan, please see below.  Controlled Substance Pharmacotherapy Assessment REMS (Risk Evaluation and Mitigation Strategy)  Analgesic: Tramadol 50 twice daily tramadol 100 mg per day MME/day: 100 mg/day.    Landis Martins, RN  11/12/2017  4:14 PM  Signed Nursing Pain Medication Assessment:  Safety precautions to be maintained throughout the outpatient stay will include: orient to surroundings, keep bed in low position, maintain call bell within reach at all times, provide assistance with transfer out of bed and ambulation.  Medication Inspection Compliance: Pill count conducted under aseptic conditions, in front of the patient. Neither the pills nor the bottle was removed from the patient's sight at any time. Once count was completed pills were immediately returned to the patient in their original bottle.  Medication: Tramadol (Ultram) Pill/Patch Count: 8 of 60 pills remain Pill/Patch Appearance: Markings consistent with prescribed medication Bottle Appearance: Standard pharmacy container. Clearly labeled. Filled Date: 06/29 / 2018 Last Medication intake:  Today   Pharmacokinetics: Liberation and absorption (onset of action): WNL Distribution (time to peak effect): WNL Metabolism and excretion (duration of action): WNL         Pharmacodynamics: Desired effects: Analgesia: Samuel Roberts reports >50% benefit. Functional ability: Patient reports that medication allows him to accomplish basic  ADLs Clinically meaningful improvement in function (CMIF): Sustained CMIF goals met Perceived effectiveness: Described as relatively effective, allowing for increase in activities of daily living (ADL) Undesirable effects: Side-effects or Adverse reactions: None reported Monitoring: [place] PMP: Online review of the past 110-monthperiod conducted. Compliant with practice rules and regulations Last UDS on record: Summary  Date Value Ref Range Status  06/24/2017 FINAL  Final    Comment:    ==================================================================== [facility] COMP DRUG ANALYSIS,UR ==================================================================== Test                             Result       Flag       Units Drug Present and Declared for Prescription Verification   Tramadol                       >2083        EXPECTED   ng/mg creat   O-Desmethyltramadol            >2083        EXPECTED   ng/mg creat   N-Desmethyltramadol            1305         EXPECTED   ng/mg creat    Source of tramadol is a prescription medication.    O-desmethyltramadol and N-desmethyltramadol are expected    metabolites of tramadol. Drug Present not Declared for Prescription Verification   Acetaminophen                  PRESENT      UNEXPECTED   Lidocaine                      PRESENT      UNEXPECTED Drug Absent but Declared for Prescription Verification   Metoprolol                     Not Detected UNEXPECTED ==================================================================== Test                      Result    Flag   Units      Ref Range   Creatinine              240              mg/dL      >=20 ==================================================================== Declared Medications:  The flagging and interpretation on this report are based on the  following declared medications.  Unexpected results may arise from  inaccuracies in the declared medications.  **Note: The testing scope of this panel  includes these medications:  Metoprolol (Lopressor)  Tramadol (Ultram)  **Note: The testing scope of this panel does not include following  reported medications:  Pantoprazole (Protonix) ==================================================================== For clinical consultation, please call ([phone] ====================================================================    UDS interpretation: Compliant          Medication Assessment Form: Reviewed. Patient indicates being compliant with therapy Treatment compliance: Compliant Risk Assessment Profile: Aberrant behavior: See prior evaluations. None observed or detected today Comorbid factors increasing risk of overdose: See prior notes. No additional risks detected today Risk of substance use disorder (SUD): Low Opioid Risk Tool - 10/29/17 1403      Family History of Substance Abuse   Alcohol  Negative    Illegal Drugs  Negative    Rx Drugs  Negative      Personal  History of Substance Abuse   Alcohol  Negative    Illegal Drugs  Negative    Rx Drugs  Negative      Age   Age between 27-45 years   No      Psychological Disease   Psychological Disease  Negative    Depression  Negative      Total Score   Opioid Risk Tool Scoring  0    Opioid Risk Interpretation  Low Risk      ORT Scoring interpretation table:  Score <3 = Low Risk for SUD  Score between 4-7 = Moderate Risk for SUD  Score >8 = High Risk for Opioid Abuse   Risk Mitigation Strategies:  Patient Counseling: Covered Patient-Prescriber Agreement (PPA): Present and active  Notification to other healthcare providers: Done  Pharmacologic Plan: No change in therapy, at this time.             Post-Procedure Assessment  10/29/2017 Procedure: Left LESI Pre-procedure pain score:  7/10 Post-procedure pain score: 0/10         Influential Factors: BMI: 28.70 kg/m Intra-procedural challenges: None observed.         Assessment challenges: None detected.               Reported side-effects: None.        Post-procedural adverse reactions or complications: None reported         Sedation: Please see nurses note. When no sedatives are used, the analgesic levels obtained are directly associated to the effectiveness of the local anesthetics. However, when sedation is provided, the level of analgesia obtained during the initial 1 hour following the intervention, is believed to be the result of a combination of factors. These factors may include, but are not limited to: 1. The effectiveness of the local anesthetics used. 2. The effects of the analgesic(s) and/or anxiolytic(s) used. 3. The degree of discomfort experienced by the patient at the time of the procedure. 4. The patients ability and reliability in recalling and recording the events. 5. The presence and influence of possible secondary gains and/or psychosocial factors. Reported result: Relief experienced during the 1st hour after the procedure: 100 % (Ultra-Short Term Relief)            Interpretative annotation: Clinically appropriate result. Analgesia during this period is likely to be Local Anesthetic and/or IV Sedative (Analgesic/Anxiolytic) related.          Effects of local anesthetic: The analgesic effects attained during this period are directly associated to the localized infiltration of local anesthetics and therefore cary significant diagnostic value as to the etiological location, or anatomical origin, of the pain. Expected duration of relief is directly dependent on the pharmacodynamics of the local anesthetic used. Long-acting (4-6 hours) anesthetics used.  Reported result: Relief during the next 4 to 6 hour after the procedure: 70 % (Short-Term Relief)            Interpretative annotation: Clinically appropriate result. Analgesia during this period is likely to be Local Anesthetic-related.          Long-term benefit: Defined as the period of time past the expected duration of local  anesthetics (1 hour for short-acting and 4-6 hours for long-acting). With the possible exception of prolonged sympathetic blockade from the local anesthetics, benefits during this period are typically attributed to, or associated with, other factors such as analgesic sensory neuropraxia, antiinflammatory effects, or beneficial biochemical changes provided by agents other than the local anesthetics.  Reported  result: Extended relief following procedure: 70 % (Long-Term Relief)            Interpretative annotation: Clinically appropriate result. Good relief. No permanent benefit expected. Inflammation plays a part in the etiology to the pain.          Current benefits: Defined as reported results that persistent at this point in time.   Analgesia: 50 % Mr. Burroughs reports that both, extremity and the axial pain improved with the treatment. Function: Mr. Tierce reports improvement in function ROM: Mr. Mathey reports improvement in ROM Interpretative annotation: Ongoing benefit. Therapeutic success. No steroids injected.          Interpretation: Results would suggest a successful intervention.                  Plan:  Please see "Plan of Care" for details.                Laboratory Chemistry  Inflammation Markers (CRP: Acute Phase) (ESR: Chronic Phase) Lab Results  Component Value Date   CRP 2.8 06/24/2017   ESRSEDRATE 11 06/24/2017                         Rheumatology Markers No results found for: RF, ANA, LABURIC, URICUR, LYMEIGGIGMAB, LYMEABIGMQN, HLAB27                      Renal Function Markers Lab Results  Component Value Date   BUN 17 09/06/2016   CREATININE 0.73 09/06/2016   GFRAA >60 09/06/2016   GFRNONAA >60 09/06/2016                             Hepatic Function Markers Lab Results  Component Value Date   AST 27 09/05/2016   ALT 15 (L) 09/05/2016   ALBUMIN 4.1 09/05/2016   ALKPHOS 62 09/05/2016                        Electrolytes Lab Results  Component Value Date    NA 138 09/06/2016   K 5.1 09/06/2016   CL 103 09/06/2016   CALCIUM 9.3 09/06/2016   MG 2.4 (H) 06/24/2017                        Neuropathy Markers No results found for: VITAMINB12, FOLATE, HGBA1C, HIV                      Bone Pathology Markers Lab Results  Component Value Date   25OHVITD1 37 06/24/2017   25OHVITD2 2.2 06/24/2017   25OHVITD3 35 06/24/2017                         Coagulation Parameters Lab Results  Component Value Date   PLT 306 09/06/2016                        Cardiovascular Markers Lab Results  Component Value Date   TROPONINI <0.03 09/06/2016   HGB 14.1 09/06/2016   HCT 40.7 09/06/2016                         CA Markers No results found for: CEA, CA125, LABCA2  Note: Lab results reviewed.  Recent Diagnostic Imaging Results  DG C-Arm 1-60 Min-No Report Fluoroscopy was utilized by the requesting physician.  No radiographic  interpretation.   Complexity Note: Imaging results reviewed. Results shared with Mr. Navarro, using Layman's terms.                         Meds   Current Outpatient Medications:  .  mesalamine (LIALDA) 1.2 g EC tablet, Take 1.2 g by mouth 2 (two) times daily., Disp: , Rfl:  .  pantoprazole (PROTONIX) 40 MG tablet, Take 40 mg by mouth every other day. , Disp: , Rfl: 0 .  [START ON 11/17/2017] traMADol (ULTRAM) 50 MG tablet, Take 1 tablet (50 mg total) by mouth 2 (two) times daily., Disp: 60 tablet, Rfl: 2  ROS  Constitutional: Denies any fever or chills Gastrointestinal: No reported hemesis, hematochezia, vomiting, or acute GI distress Musculoskeletal: Denies any acute onset joint swelling, redness, loss of ROM, or weakness Neurological: No reported episodes of acute onset apraxia, aphasia, dysarthria, agnosia, amnesia, paralysis, loss of coordination, or loss of consciousness  Allergies  Mr. Hitsman has No Known Allergies.  [place]  Drug: Mr. Barthelemy  reports that he does not use drugs. Alcohol:   reports that he does not drink alcohol. Tobacco:  reports that he has never smoked. He has never used smokeless tobacco. 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 Surgical: Mr. Anastasia  has a past surgical history that includes Knee arthroscopy and arthrotomy; Tonsillectomy; LEFT HEART CATH AND CORONARY ANGIOGRAPHY (Left, 09/09/2016); and Joint replacement (Right). Family: family history includes Cancer in his father; Cirrhosis in his brother; Diabetes in his mother, sister, and sister; Drug abuse in his brother and brother; Heart disease in his brother and brother; Heart failure in his father and mother.  Constitutional Exam  General appearance: Well nourished, well developed, and well hydrated. In no apparent acute distress Vitals:   11/12/17 1253  BP: 131/89  Pulse: 70  Resp: 16  Temp: 98 F (36.7 C)  TempSrc: Oral  SpO2: 98%  Weight: 200 lb (90.7 kg)  Height: 5' 10"  (1.778 m)   BMI Assessment: Estimated body mass index is 28.7 kg/m as calculated from the following:   Height as of this encounter: 5' 10"  (1.778 m).   Weight as of this encounter: 200 lb (90.7 kg).  BMI interpretation table: BMI level Category Range association with higher incidence of chronic pain  <18 kg/m2 Underweight   18.5-24.9 kg/m2 Ideal body weight   25-29.9 kg/m2 Overweight Increased incidence by 20%  30-34.9 kg/m2 Obese (Class I) Increased incidence by 68%  35-39.9 kg/m2 Severe obesity (Class II) Increased incidence by 136%  >40 kg/m2 Extreme obesity (Class III) Increased incidence by 254%   Patient's current BMI Ideal Body weight  Body mass index is 28.7 kg/m. Ideal body weight: 73 kg (160 lb 15 oz) Adjusted ideal body weight: 80.1 kg (176 lb 9 oz)   BMI Readings from Last 4 Encounters:  11/12/17 28.70 kg/m  10/29/17 28.70 kg/m  08/13/17 28.70 kg/m  08/05/17 28.70 kg/m   Wt Readings from Last 4 Encounters:  11/12/17 200 lb (90.7  kg)  10/29/17 200 lb (90.7 kg)  08/13/17 200 lb (90.7 kg)  08/05/17 200 lb (90.7 kg)  Psych/Mental status: Alert, oriented x 3 (person, place, & time)       Eyes: PERLA Respiratory: No evidence of acute  respiratory distress  Cervical Spine Area Exam  Skin & Axial Inspection: No masses, redness, edema, swelling, or associated skin lesions Alignment: Symmetrical Functional ROM: Improved after treatment      Stability: No instability detected Muscle Tone/Strength: Functionally intact. No obvious neuro-muscular anomalies detected. Sensory (Neurological): Unimpaired Palpation: No palpable anomalies              Upper Extremity (UE) Exam    Side: Right upper extremity  Side: Left upper extremity  Skin & Extremity Inspection: Skin color, temperature, and hair growth are WNL. No peripheral edema or cyanosis. No masses, redness, swelling, asymmetry, or associated skin lesions. No contractures.  Skin & Extremity Inspection: Skin color, temperature, and hair growth are WNL. No peripheral edema or cyanosis. No masses, redness, swelling, asymmetry, or associated skin lesions. No contractures.  Functional ROM: Unrestricted ROM          Functional ROM: Unrestricted ROM          Muscle Tone/Strength: Functionally intact. No obvious neuro-muscular anomalies detected.  Muscle Tone/Strength: Functionally intact. No obvious neuro-muscular anomalies detected.  Sensory (Neurological): Unimpaired          Sensory (Neurological): Unimpaired          Palpation: No palpable anomalies              Palpation: No palpable anomalies              Provocative Test(s):  Phalen's test: deferred Tinel's test: deferred Apley's scratch test (touch opposite shoulder):  Action 1 (Across chest): deferred Action 2 (Overhead): deferred Action 3 (LB reach): deferred   Provocative Test(s):  Phalen's test: deferred Tinel's test: deferred Apley's scratch test (touch opposite shoulder):  Action 1 (Across chest):  deferred Action 2 (Overhead): deferred Action 3 (LB reach): deferred    Gait & Posture Assessment  Ambulation: Unassisted Gait: Relatively normal for age and body habitus Posture: WNL   Assessment  Primary Diagnosis & Pertinent Problem List: The primary encounter diagnosis was Cervical spondylosis. 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  Status Diagnosis  Controlled Controlled Controlled 1. Cervical spondylosis   2. Cervicalgia   3. 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 Chronic pain syndrome     Problems updated and reviewed during this visit: No problems updated. Plan of Care  Pharmacotherapy (Medications Ordered): Meds ordered this encounter  Medications  . traMADol (ULTRAM) 50 MG tablet    Sig: Take 1 tablet (50 mg total) by mouth 2 (two) times daily.    Dispense:  60 tablet    Refill:  2    Fill one day early if pharmacy is closed on scheduled refill date.  Fill every 30 days .Do not fill until: 11/17/2017 To last until: 02/15/2018    Order Specific Question:   Supervising Provider    Answer:   Milinda Pointer [phone]   New Prescriptions   No medications on file   Medications administered today: Jaelin Fackler. Kittrell had no medications administered during this visit. Lab-work, procedure(s), and/or referral(s): No orders of the defined types were placed in this encounter.  Imaging and/or referral(s): None  Interventional management options: Planned, scheduled, and/or pending: Not at this time.. Instructed  patient on prn treatment   Considering: Diagnostic left-sided cervical facet nerve block Possible left-sided cervical facet RFA Diagnosticleft suprascapular nerve block Diagnostictrigger point injection Diagnosticright lumbar facet nerve block Diagnostic right lumbar facet RFA  Diagnostic Left knee Hyalgan series Diagnostic  Right knee genicular nerve block Possible right knee genicular RFA   PRN Procedures: left-sided cervical epidural steroid injection #3    Provider-requested follow-up: Return in about 3 months (around 02/12/2018) for MedMgmt with Me Donella Stade Edison Pace).  Future Appointments  Date Time Provider [place]  02/11/2018  8:30 AM Vevelyn Francois, NP [facility] None   Primary Care Physician: [name], MD Location: [facility] Outpatient Pain Management Facility Note by: Vevelyn Francois NP Date: 11/12/2017; Time: 4:15 PM  Pain Score Disclaimer: We use the NRS-11 scale. This is a self-reported, subjective measurement of pain severity with only modest accuracy. It is used primarily to identify changes within a particular patient. It must be understood that outpatient pain scales are significantly less accurate that those used for research, where they can be applied under ideal controlled circumstances with minimal exposure to variables. In reality, the score is likely to be a combination of pain intensity and pain affect, where pain affect describes the degree of emotional arousal or changes in action readiness caused by the sensory experience of pain. Factors such as social and work situation, setting, emotional state, anxiety levels, expectation, and prior pain experience may influence pain perception and show large inter-individual differences that may also be affected by time variables.  Patient instructions provided during this appointment: Patient Instructions  ____________________________________________________________________________________________  Medication Rules  Applies to: All patients receiving prescriptions (written or electronic).  Pharmacy of record: Pharmacy where electronic prescriptions will be sent. If written prescriptions are taken to a different pharmacy, please inform the nursing staff. The pharmacy listed in the electronic medical record should be the one where  you would like electronic prescriptions to be sent.  Prescription refills: Only during scheduled appointments. Applies to both, written and electronic prescriptions.  NOTE: The following applies primarily to controlled substances (Opioid* Pain Medications).   Patient's responsibilities: 1. Pain Pills: Bring all pain pills to every appointment (except for procedure appointments). 2. Pill Bottles: Bring pills in original pharmacy bottle. Always bring newest bottle. Bring bottle, even if empty. 3. Medication refills: You are responsible for knowing and keeping track of what medications you need refilled. The day before your appointment, write a list of all prescriptions that need to be refilled. Bring that list to your appointment and give it to the admitting nurse. Prescriptions will be written only during appointments. If you forget a medication, it will not be "Called in", "Faxed", or "electronically sent". You will need to get another appointment to get these prescribed. 4. Prescription Accuracy: You are responsible for carefully inspecting your prescriptions before leaving our office. Have the discharge nurse carefully go over each prescription with you, before taking them home. Make sure that your name is accurately spelled, that your address is correct. Check the name and dose of your medication to make sure it is accurate. Check the number of pills, and the written instructions to make sure they are clear and accurate. Make sure that you are given enough medication to last until your next medication refill appointment. 5. Taking Medication: Take medication as prescribed. Never take more pills than instructed. Never take medication more frequently than prescribed. Taking less pills or less frequently is permitted and encouraged, when it comes to controlled substances (written prescriptions).  6. Inform other Doctors: Always inform, all of your healthcare providers, of all the medications you  take. 7. Pain Medication from other Providers: You are not allowed to accept any additional pain medication  from any other Doctor or Healthcare provider. There are two exceptions to this rule. (see below) In the event that you require additional pain medication, you are responsible for notifying us, as stated below. 8. Medication Agreement: You are responsible for carefully reading and following our Medication Agreement. This must be signed before receiving any prescriptions from our practice. Safely store a copy of your signed Agreement. Violations to the Agreement will result in no further prescriptions. (Additional copies of our Medication Agreement are available upon request.) 9. Laws, Rules, & Regulations: All patients are expected to follow all Federal and [employer], [employer], Rules, [employer]. Ignorance of the Laws does not constitute a valid excuse. The use of any illegal substances is prohibited. 10. Adopted CDC guidelines & recommendations: Target dosing levels will be at or below 60 MME/day. Use of benzodiazepines** is not recommended.  Exceptions: There are only two exceptions to the rule of not receiving pain medications from other Healthcare Providers. 1. Exception #1 (Emergencies): In the event of an emergency (i.e.: accident requiring emergency care), you are allowed to receive additional pain medication. However, you are responsible for: As soon as you are able, call our office [phone], at any time of the day or night, and leave a message stating your name, the date and nature of the emergency, and the name and dose of the medication prescribed. In the event that your call is answered by a member of our staff, make sure to document and save the date, time, and the name of the person that took your information.  2. Exception #2 (Planned Surgery): In the event that you are scheduled by another doctor or dentist to have any type of surgery or procedure, you are allowed (for a period  no longer than 30 days), to receive additional pain medication, for the acute post-op pain. However, in this case, you are responsible for picking up a copy of our "Post-op Pain Management for Surgeons" handout, and giving it to your surgeon or dentist. This document is available at our office, and does not require an appointment to obtain it. Simply go to our office during business hours (Monday-Thursday from 8:00 AM to 4:00 PM) (Friday 8:00 AM to 12:00 Noon) or if you have a scheduled appointment with [place], prior to your surgery, and ask for it by name. In addition, you will need to provide [place] with your name, name of your surgeon, type of surgery, and date of procedure or surgery.  *Opioid medications include: morphine, codeine, oxycodone, oxymorphone, hydrocodone, hydromorphone, meperidine, tramadol, tapentadol, buprenorphine, fentanyl, methadone. **Benzodiazepine medications include: diazepam (Valium), alprazolam (Xanax), clonazepam (Klonopine), lorazepam (Ativan), clorazepate (Tranxene), chlordiazepoxide (Librium), estazolam (Prosom), oxazepam (Serax), temazepam (Restoril), triazolam (Halcion) (Last updated: 06/18/2017) ____________________________________________________________________________________________ Trip Bast were given one prescription for Tramadol.

## 2017-12-10 ENCOUNTER — Other Ambulatory Visit: Payer: Self-pay | Admitting: Nurse Practitioner

## 2017-12-10 DIAGNOSIS — G8929 Other chronic pain: Secondary | ICD-10-CM

## 2017-12-10 DIAGNOSIS — M1711 Unilateral primary osteoarthritis, right knee: Secondary | ICD-10-CM

## 2017-12-10 DIAGNOSIS — M25561 Pain in right knee: Principal | ICD-10-CM

## 2017-12-10 DIAGNOSIS — M25562 Pain in left knee: Principal | ICD-10-CM

## 2017-12-15 NOTE — Progress Notes (Signed)
Patient's Name: ANTHONYMICHAEL MUNDAY  MRN: [id number]  Referring Provider: Vevelyn Francois, NP  DOB: 07/26/1955  PCP: [name], MD  DOS: 12/16/2017  Note by: Gaspar Cola, MD  Service setting: Ambulatory outpatient  Specialty: Interventional Pain Management  Location: [facility] (AMB) Pain Management Facility    Patient type: Established   Primary Reason(s) for Visit: Evaluation of chronic illnesses with exacerbation, or progression (Level of risk: moderate) CC: Knee Pain (right)  HPI  Mr. Sauceda is a 62 y.o. year old, male patient, who comes today for a follow-up evaluation. He has Angina pectoris ([place]); Crohn's colitis, with rectal bleeding ([place]); BPH (benign prostatic hyperplasia); Carcinoma in situ of prostate; Elevated prostate specific antigen (PSA); Family history of malignant neoplasm of prostate; Arthropathy of knee (Right); Enlarged prostate with lower urinary tract symptoms (LUTS); Chronic neck pain  (Primary Area of Pain) (Bilateral) (L>R); Occipital neuralgia (Tertiary Area of Pain) (Left); Chronic shoulder pain (Secondary Area of Pain) (Left); Chronic low back pain (Fourth Area of Pain) (Right) without sciatica; Chronic knee pain (Fifth Area of Pain) (Bilateral) (R>L); Chronic pain syndrome; Long term current use of opiate analgesic; Pharmacologic therapy; Disorder of skeletal system; Problems influencing health status; Pain of left humerus; Chronic upper extremity pain (Left); DDD (degenerative disc disease), cervical; Cervical spondylosis; 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 Mr. Bunn was last seen on 10/29/2017. His primarily concern today is the Knee Pain (right)  Pain Assessment: Location: Right Knee(right knee) Radiating: Pain radiaties down front of leg Onset: More than a month ago Duration: Chronic pain Quality: Aching, Burning,  Stabbing, Pressure, Constant, Nagging(swelling in the back of knee) Severity: 7 /10 (subjective, self-reported pain score)  Note: Reported level is inconsistent with clinical observations. Clinically the patient looks like a 2/10 A 2/10 is viewed as "Mild to Moderate" and described as noticeable and distracting. Impossible to hide from other people. More frequent flare-ups. Still possible to adapt and function close to normal. It can be very annoying and may have occasional stronger flare-ups. With discipline, patients may get used to it and adapt. Mr. Topel continues to use a standard subjective pain scale, rather than an objective pain scale as instructed. When using our objective Pain Scale, levels between 6 and 10/10 are said to belong in an emergency room, as it progressively worsens from a 6/10, described as severely limiting, requiring emergency care not usually available at an outpatient pain management facility. At a 6/10 level, communication becomes difficult and requires great effort. Assistance to reach the emergency department may be required. Facial flushing and profuse sweating along with potentially dangerous increases in heart rate and blood pressure will be evident. Effect on ADL: limits my daily activities Timing: Constant Modifying factors: ice and heat, elevate knee BP: 129/82  HR: (!) 59   The patient was unable to stay for the entire appointment due delays in today's schedule.  Constitutional Exam  General appearance: Well nourished, well developed, and well hydrated. In no apparent acute distress Vitals:   12/16/17 0931  BP: 129/82  Pulse: (!) 59  Temp: 98 F (36.7 C)  SpO2: 99%  Weight: 200 lb (90.7 kg)  Height: 5' 10"  (1.778 m)   Assessment  Primary Diagnosis & Pertinent Problem List: The primary encounter diagnosis was Chronic pain syndrome. A diagnosis of Chronic knee pain (Fifth Area of Pain) (Bilateral) (R>L) was also pertinent to this visit.  Diagnosis  1.  Chronic pain syndrome   2. Chronic knee pain (Fifth Area of Pain) (Bilateral) (R>L)      Plan of Care  Interventional management options: Planned, scheduled, and/or pending:   Diagnostic Left knee Hyalgan series  Diagnostic Right knee genicular nerve block    Considering:   Diagnostic left-sided cervical facet nerve block  Possible left-sided cervical facet RFA  Diagnosticleft suprascapular nerve block  Diagnostictrigger point injection  Diagnosticright lumbar facet nerve block  Diagnostic right lumbar facet RFA  Diagnostic Left knee Hyalgan series  Diagnostic Right knee genicular nerve block  Possible right knee genicular RFA    Palliative PRN treatment(s):   None at this time   Provider-requested follow-up: Return for Med-Mgmt, w/ Dionisio David, NP.  Future Appointments  Date Time Provider [place]  02/11/2018  8:30 AM Vevelyn Francois, NP [facility] None   Primary Care Physician: [name], MD Location: [facility] Note by: Gaspar Cola, MD Date: 12/16/2017; Time: 6:04 PM

## 2017-12-16 ENCOUNTER — Encounter: Payer: Self-pay | Admitting: Pain Medicine

## 2017-12-16 ENCOUNTER — Other Ambulatory Visit: Payer: Self-pay

## 2017-12-16 ENCOUNTER — Ambulatory Visit (HOSPITAL_BASED_OUTPATIENT_CLINIC_OR_DEPARTMENT_OTHER): Payer: 59 | Admitting: Pain Medicine

## 2017-12-16 VITALS — BP 129/82 | HR 59 | Temp 98.0°F | Ht 70.0 in | Wt 200.0 lb

## 2017-12-16 DIAGNOSIS — G894 Chronic pain syndrome: Secondary | ICD-10-CM | POA: Diagnosis not present

## 2017-12-16 DIAGNOSIS — G8929 Other chronic pain: Secondary | ICD-10-CM | POA: Diagnosis not present

## 2017-12-16 DIAGNOSIS — M25561 Pain in right knee: Secondary | ICD-10-CM | POA: Diagnosis not present

## 2017-12-16 DIAGNOSIS — M25562 Pain in left knee: Secondary | ICD-10-CM | POA: Diagnosis not present

## 2017-12-22 NOTE — Progress Notes (Signed)
Patient's Name: Samuel Roberts  MRN: [id number]  Referring Provider: Vevelyn Francois, NP  DOB: [birth year]  PCP: [name], MD  DOS: 12/23/2017  Note by: Gaspar Cola, MD  Service setting: Ambulatory outpatient  Specialty: Interventional Pain Management  Location: [facility] (AMB) Pain Management Facility    Patient type: Established   Primary Reason(s) for Visit: Evaluation of chronic illnesses with exacerbation, or progression (Level of risk: moderate) CC: Knee Pain (bialteral)  HPI  Samuel Roberts is a 62 y.o. year old, male patient, who comes today for a follow-up evaluation. He has Angina pectoris ([place]); Crohn's colitis, with rectal bleeding ([place]); BPH (benign prostatic hyperplasia); Carcinoma in situ of prostate; Elevated prostate specific antigen (PSA); Family history of malignant neoplasm of prostate; Arthropathy of knee (Right); Enlarged prostate with lower urinary tract symptoms (LUTS); Chronic neck pain  (Primary Area of Pain) (Bilateral) (L>R); Occipital neuralgia (Tertiary Area of Pain) (Left); Chronic shoulder pain (Secondary Area of Pain) (Left); Chronic low back pain (Fourth Area of Pain) (Right) without sciatica; Chronic knee pain (Fifth Area of Pain) (Bilateral) (R>L); Chronic pain syndrome; Long term current use of opiate analgesic; Pharmacologic therapy; Disorder of skeletal system; Problems influencing health status; Pain of left humerus; Chronic upper extremity pain (Left); DDD (degenerative disc disease), cervical; Cervical spondylosis; 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 [name] was last seen on 10/29/2017. His primarily concern today is the Knee Pain (bialteral)  Pain Assessment: Location: Right, Left Knee Radiating: right lower leg sometimes Onset: More than a month ago Duration: Chronic pain Quality: Aching, Throbbing Severity: 8  /10 (subjective, self-reported pain score)  Note: Reported level is inconsistent with clinical observations. Clinically the patient looks like a 2/10 A 2/10 is viewed as "Mild to Moderate" and described as noticeable and distracting. Impossible to hide from other people. More frequent flare-ups. Still possible to adapt and function close to normal. It can be very annoying and may have occasional stronger flare-ups. With discipline, patients may get used to it and adapt. Samuel Roberts continues to use a standard subjective pain scale, rather than an objective pain scale as instructed. When using our objective Pain Scale, levels between 6 and 10/10 are said to belong in an emergency room, as it progressively worsens from a 6/10, described as severely limiting, requiring emergency care not usually available at an outpatient pain management facility. At a 6/10 level, communication becomes difficult and requires great effort. Assistance to reach the emergency department may be required. Facial flushing and profuse sweating along with potentially dangerous increases in heart rate and blood pressure will be evident. Timing: Constant Modifying factors: ice, elevation BP: 132/90  HR: 77  The patient comes in today clinics today to review his alternatives to regarding the bilateral knee pain. Today we talked about all of those alternatives and I made it clear that everything that I have to offer has to do primarily with the pain but not the mechanics of the knee. His main concern is that the right knee will not band as much as it used to. He believes that this is progressing. He was informed that he needed to begin considering a total knee replacement. At this point he thinks that he may be at that point. We will go ahead and order an MRI of the knee to determine if it is time to proceed with that replacement. Have also arranged for a referral  to Dr. Rudene Christians, the orthopedic surgeon of the [facility] Hopefully he can  assist with this right knee replacement. We will continue to manage the patient's other problems including that of the neck. He seems to be doing rather well with regards to that and therefore I have given him a when necessary order for his third cervical epidural steroid injection, should he need it. He has total control over when to get that injection.  Further details on both, my assessment(s), as well as the proposed treatment plan, please see below.  Laboratory Chemistry  Inflammation Markers (CRP: Acute Phase) (ESR: Chronic Phase) Lab Results  Component Value Date   CRP 2.8 06/24/2017   ESRSEDRATE 11 06/24/2017                         Renal Function Markers Lab Results  Component Value Date   BUN 17 09/06/2016   CREATININE 0.73 09/06/2016   GFRAA >60 09/06/2016   GFRNONAA >60 09/06/2016                             Hepatic Function Markers Lab Results  Component Value Date   AST 27 09/05/2016   ALT 15 (L) 09/05/2016   ALBUMIN 4.1 09/05/2016   ALKPHOS 62 09/05/2016                        Electrolytes Lab Results  Component Value Date   NA 138 09/06/2016   K 5.1 09/06/2016   CL 103 09/06/2016   CALCIUM 9.3 09/06/2016   MG 2.4 (H) 06/24/2017                        Bone Pathology Markers Lab Results  Component Value Date   25OHVITD1 37 06/24/2017   25OHVITD2 2.2 06/24/2017   25OHVITD3 35 06/24/2017                         Coagulation Parameters Lab Results  Component Value Date   PLT 306 09/06/2016                        Cardiovascular Markers Lab Results  Component Value Date   TROPONINI <0.03 09/06/2016   HGB 14.1 09/06/2016   HCT 40.7 09/06/2016                         Note: Lab results reviewed.  Recent Diagnostic Imaging Review  Cervical Imaging: Cervical DG complete:  Results for orders placed during the hospital encounter of 06/24/17  DG Cervical Spine Complete   Narrative CLINICAL DATA:  Occipital neuralgia left side.  Chronic neck  pain  EXAM: CERVICAL SPINE - COMPLETE 4+ VIEW  COMPARISON:  CT angio head and neck 12/09/2015  FINDINGS: Normal alignment.  No fracture or mass.  Disc degeneration and spurring C3-4 and C4-5. Mild foraminal narrowing bilaterally C3-4 and C4-5 due to spurring. Remaining foramen patent. Soft tissues negative  IMPRESSION: Disc degeneration and spondylosis C3-4 and C4-5 with mild foraminal narrowing bilaterally.   Electronically Signed   By: Franchot Gallo M.D.   On: 06/24/2017 15:09    Shoulder Imaging: Shoulder-L DG:  Results for orders placed during the hospital encounter of 06/24/17  DG Shoulder Left   Narrative CLINICAL DATA:  Chronic left shoulder pain  EXAM: LEFT SHOULDER - 2+ VIEW  COMPARISON:  None.  FINDINGS: There is no evidence of fracture or dislocation. There is no evidence of arthropathy or other focal bone abnormality. Soft tissues are unremarkable.  IMPRESSION: Negative.   Electronically Signed   By: Franchot Gallo M.D.   On: 06/24/2017 15:10    Lumbosacral Imaging: Lumbar DG Bending views:  Results for orders placed during the hospital encounter of 07/13/17  DG Lumbar Spine Complete W/Bend   Narrative CLINICAL DATA:  Chronic right low back pain  EXAM: LUMBAR SPINE - COMPLETE WITH BENDING VIEWS  COMPARISON:  CT abdomen pelvis of 07/18/2013  FINDINGS: The lumbar vertebrae remain in normal alignment with normal intervertebral disc spaces. Mild anterior osteophyte formation is present. There is some irregularity to the anterior aspect of T11 on the neutral lateral view and thoracic spine films may be help to assess further. No definite compression deformity is seen. Through flexion and extension there is limited range of motion with no malalignment.  IMPRESSION: 1. Normal alignment with mild osteophyte formation anteriorly. No definite compression deformity. 2. Slight irregularity of the anterior aspect of I14 of  uncertain significance. Consider thoracic spine films. 3. Limited range of motion through flexion and extension.   Electronically Signed   By: Ivar Drape M.D.   On: 07/13/2017 15:02    Knee Imaging: Knee-R DG 1-2 views:  Results for orders placed during the hospital encounter of 07/13/17  DG Knee 1-2 Views Right   Narrative CLINICAL DATA:  Chronic right back and bilateral knee pain  EXAM: RIGHT KNEE - 1-2 VIEW  COMPARISON:  MR right knee 02/16/2008  FINDINGS: Hardware is present from repair prior ACL tear. However the U shaped fixation component along the lateral femoral condyle appears to be within soft tissues most likely has retracted. There is moderate tricompartmental degenerative joint disease the right knee primarily involving the medial and patellofemoral compartments with loss of joint space and sclerosis with spurring. Faint chondrocalcinosis is seen which could indicate CPPD arthropathy. No fracture or joint effusion is noted.  IMPRESSION: 1. No acute fracture.  No joint effusion. 2. Hardware secondary to prior repair of ACL tear. 3. Significant tricompartmental degenerative joint disease. 4. Fixation pin along the lateral femoral condyle appears to have retracted and is within the soft tissues laterally.   Electronically Signed   By: Ivar Drape M.D.   On: 07/13/2017 15:07    Knee-L DG 1-2 views:  Results for orders placed during the hospital encounter of 07/13/17  DG Knee 1-2 Views Left   Narrative CLINICAL DATA:  Chronic low back pain, and chronic bilateral knee pain  EXAM: LEFT KNEE - 1-2 VIEW  COMPARISON:  None.  FINDINGS: There is minimal spurring from the patella with all joint spaces relatively well preserved. No fracture seen and no joint effusion is noted.  IMPRESSION: Mild degenerative spurring from the patella.  No other abnormality.   Electronically Signed   By: Ivar Drape M.D.   On: 07/13/2017 15:08    Complexity Note:  Imaging results reviewed. Results shared with Mr. Winsett, using Layman's terms.                         Meds   Current Outpatient Medications:  .  mesalamine (LIALDA) 1.2 g EC tablet, Take 1.2 g by mouth 2 (two) times daily., Disp: , Rfl:  .  pantoprazole (PROTONIX) 40 MG tablet, Take 40 mg by mouth every other  day. , Disp: , Rfl: 0 .  traMADol (ULTRAM) 50 MG tablet, Take 1 tablet (50 mg total) by mouth 2 (two) times daily., Disp: 60 tablet, Rfl: 2  ROS  Constitutional: Denies any fever or chills Gastrointestinal: No reported hemesis, hematochezia, vomiting, or acute GI distress Musculoskeletal: Denies any acute onset joint swelling, redness, loss of ROM, or weakness Neurological: No reported episodes of acute onset apraxia, aphasia, dysarthria, agnosia, amnesia, paralysis, loss of coordination, or loss of consciousness  Allergies  Mr. Voisin has No Known Allergies.  [place]  Drug: Mr. Bankhead  reports that he does not use drugs. Alcohol:  reports that he does not drink alcohol. Tobacco:  reports that he has never smoked. He has never used smokeless tobacco. 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 Surgical: Mr. Corrales  has a past surgical history that includes Knee arthroscopy and arthrotomy; Tonsillectomy; LEFT HEART CATH AND CORONARY ANGIOGRAPHY (Left, 09/09/2016); and Joint replacement (Right). Family: family history includes Cancer in his father; Cirrhosis in his brother; Diabetes in his mother, sister, and sister; Drug abuse in his brother and brother; Heart disease in his brother and brother; Heart failure in his father and mother.  Constitutional Exam  General appearance: Well nourished, well developed, and well hydrated. In no apparent acute distress Vitals:   12/23/17 0824  BP: 132/90  Pulse: 77  Resp: 16  Temp: 98 F (36.7 C)  TempSrc: Oral  SpO2: 99%  Weight: 200 lb (90.7 kg)  Height: 5' 10"  (1.778 m)    BMI Assessment: Estimated body mass index is 28.7 kg/m as calculated from the following:   Height as of this encounter: 5' 10"  (1.778 m).   Weight as of this encounter: 200 lb (90.7 kg).  BMI interpretation table: BMI level Category Range association with higher incidence of chronic pain  <18 kg/m2 Underweight   18.5-24.9 kg/m2 Ideal body weight   25-29.9 kg/m2 Overweight Increased incidence by 20%  30-34.9 kg/m2 Obese (Class I) Increased incidence by 68%  35-39.9 kg/m2 Severe obesity (Class II) Increased incidence by 136%  >40 kg/m2 Extreme obesity (Class III) Increased incidence by 254%   Patient's current BMI Ideal Body weight  Body mass index is 28.7 kg/m. Ideal body weight: 73 kg (160 lb 15 oz) Adjusted ideal body weight: 80.1 kg (176 lb 9 oz)   BMI Readings from Last 4 Encounters:  12/23/17 28.70 kg/m  12/16/17 28.70 kg/m  11/12/17 28.70 kg/m  10/29/17 28.70 kg/m   Wt Readings from Last 4 Encounters:  12/23/17 200 lb (90.7 kg)  12/16/17 200 lb (90.7 kg)  11/12/17 200 lb (90.7 kg)  10/29/17 200 lb (90.7 kg)  Psych/Mental status: Alert, oriented x 3 (person, place, & time)       Eyes: PERLA Respiratory: No evidence of acute respiratory distress  Cervical Spine Area Exam  Skin & Axial Inspection: No masses, redness, edema, swelling, or associated skin lesions Alignment: Symmetrical Functional ROM: Unrestricted ROM      Stability: No instability detected Muscle Tone/Strength: Functionally intact. No obvious neuro-muscular anomalies detected. Sensory (Neurological): Unimpaired Palpation: No palpable anomalies              Upper Extremity (UE) Exam    Side: Right upper extremity  Side: Left upper extremity  Skin & Extremity Inspection: Skin color, temperature, and hair growth are WNL. No peripheral edema or cyanosis. No masses, redness, swelling, asymmetry, or associated skin lesions. No contractures.  Skin &  Extremity Inspection: Skin color, temperature, and hair  growth are WNL. No peripheral edema or cyanosis. No masses, redness, swelling, asymmetry, or associated skin lesions. No contractures.  Functional ROM: Unrestricted ROM          Functional ROM: Unrestricted ROM          Muscle Tone/Strength: Functionally intact. No obvious neuro-muscular anomalies detected.  Muscle Tone/Strength: Functionally intact. No obvious neuro-muscular anomalies detected.  Sensory (Neurological): Unimpaired          Sensory (Neurological): Unimpaired          Palpation: No palpable anomalies              Palpation: No palpable anomalies              Provocative Test(s):  Phalen's test: deferred Tinel's test: deferred Apley's scratch test (touch opposite shoulder):  Action 1 (Across chest): deferred Action 2 (Overhead): deferred Action 3 (LB reach): deferred   Provocative Test(s):  Phalen's test: deferred Tinel's test: deferred Apley's scratch test (touch opposite shoulder):  Action 1 (Across chest): deferred Action 2 (Overhead): deferred Action 3 (LB reach): deferred    Thoracic Spine Area Exam  Skin & Axial Inspection: No masses, redness, or swelling Alignment: Symmetrical Functional ROM: Unrestricted ROM Stability: No instability detected Muscle Tone/Strength: Functionally intact. No obvious neuro-muscular anomalies detected. Sensory (Neurological): Unimpaired Muscle strength & Tone: No palpable anomalies  Lumbar Spine Area Exam  Skin & Axial Inspection: No masses, redness, or swelling Alignment: Symmetrical Functional ROM: Unrestricted ROM       Stability: No instability detected Muscle Tone/Strength: Functionally intact. No obvious neuro-muscular anomalies detected. Sensory (Neurological): Unimpaired Palpation: No palpable anomalies       Provocative Tests: Hyperextension/rotation test: deferred today       Lumbar quadrant test (Kemp's test): deferred today       Lateral bending test: deferred today       Patrick's Maneuver: deferred today                    FABER test: deferred today                   S-I anterior distraction/compression test: deferred today         S-I lateral compression test: deferred today         S-I Thigh-thrust test: deferred today         S-I Gaenslen's test: deferred today          Gait & Posture Assessment  Ambulation: Unassisted Gait: Relatively normal for age and body habitus Posture: WNL   Lower Extremity Exam    Side: Right lower extremity  Side: Left lower extremity  Stability: No instability observed          Stability: No instability observed          Skin & Extremity Inspection: Skin color, temperature, and hair growth are WNL. No peripheral edema or cyanosis. No masses, redness, swelling, asymmetry, or associated skin lesions. No contractures.  Skin & Extremity Inspection: Skin color, temperature, and hair growth are WNL. No peripheral edema or cyanosis. No masses, redness, swelling, asymmetry, or associated skin lesions. No contractures.  Functional ROM: Unrestricted ROM                  Functional ROM: Unrestricted ROM                  Muscle Tone/Strength: Functionally intact. No obvious neuro-muscular anomalies detected.  Muscle Tone/Strength: Functionally intact. No obvious neuro-muscular anomalies detected.  Sensory (Neurological): Unimpaired  Sensory (Neurological): Unimpaired  Palpation: No palpable anomalies  Palpation: No palpable anomalies   Assessment  Primary Diagnosis & Pertinent Problem List: The primary encounter diagnosis was Chronic knee pain (Fifth Area of Pain) (Bilateral) (R>L). Diagnoses of Chronic pain of right knee, Osteoarthritis of knees (Bilateral), Tricompartment osteoarthritis of knee (Right), and DDD (degenerative disc disease), cervical were also pertinent to this visit.  Status Diagnosis  Persistent Worsening progressing 1. Chronic knee pain (Fifth Area of Pain) (Bilateral) (R>L)   2. Chronic pain of right knee   3. Osteoarthritis of knees (Bilateral)   4.  Tricompartment osteoarthritis of knee (Right)   5. DDD (degenerative disc disease), cervical     Problems updated and reviewed during this visit: No problems updated. Plan of Care  Pharmacotherapy (Medications Ordered): No orders of the defined types were placed in this encounter.  Medications administered today: Vangie Bicker. Barich had no medications administered during this visit.   Procedure Orders     Cervical Epidural Injection Lab Orders  No laboratory test(s) ordered today    Imaging Orders     MR KNEE RIGHT WO CONTRAST  Referral Orders     Ambulatory referral to Orthopedic Surgery  Interventional management options: Planned, scheduled, and/or pending:   None at this time   Considering:   Diagnostic left-sided cervical facet nerve block  Possible left-sided cervical facet RFA  Diagnosticleft suprascapular nerve block  Diagnostictrigger point injection  Diagnosticright lumbar facet nerve block  Diagnostic right lumbar facet RFA  Diagnostic Left knee Hyalgan series  Diagnostic Right knee genicular nerve block  Possible right knee genicular RFA    Palliative PRN treatment(s):   Diagnostic left-sided cervical epidural steroid injection #3 (PRN)   Provider-requested follow-up: Return for PRN Procedure: (L) CESI #3.  Future Appointments  Date Time Provider [place]  02/11/2018  8:30 AM Vevelyn Francois, NP [facility] None   Primary Care Physician: [name], MD Location: [facility] Out[facility] Pain Management Facility Note by: Gaspar Cola, MD Date: 12/23/2017; Time: 12:49 PM

## 2017-12-23 ENCOUNTER — Ambulatory Visit: Payer: 59 | Attending: Nurse Practitioner | Admitting: Pain Medicine

## 2017-12-23 ENCOUNTER — Other Ambulatory Visit: Payer: Self-pay

## 2017-12-23 ENCOUNTER — Encounter: Payer: Self-pay | Admitting: Pain Medicine

## 2017-12-23 VITALS — BP 132/90 | HR 77 | Temp 98.0°F | Resp 16 | Ht 70.0 in | Wt 200.0 lb

## 2017-12-23 DIAGNOSIS — Z79891 Long term (current) use of opiate analgesic: Secondary | ICD-10-CM | POA: Insufficient documentation

## 2017-12-23 DIAGNOSIS — N401 Enlarged prostate with lower urinary tract symptoms: Secondary | ICD-10-CM | POA: Diagnosis not present

## 2017-12-23 DIAGNOSIS — M4802 Spinal stenosis, cervical region: Secondary | ICD-10-CM | POA: Insufficient documentation

## 2017-12-23 DIAGNOSIS — M25512 Pain in left shoulder: Secondary | ICD-10-CM | POA: Insufficient documentation

## 2017-12-23 DIAGNOSIS — M79602 Pain in left arm: Secondary | ICD-10-CM | POA: Diagnosis not present

## 2017-12-23 DIAGNOSIS — G894 Chronic pain syndrome: Secondary | ICD-10-CM | POA: Insufficient documentation

## 2017-12-23 DIAGNOSIS — K50111 Crohn's disease of large intestine with rectal bleeding: Secondary | ICD-10-CM | POA: Insufficient documentation

## 2017-12-23 DIAGNOSIS — M5030 Other cervical disc degeneration, unspecified cervical region: Secondary | ICD-10-CM | POA: Insufficient documentation

## 2017-12-23 DIAGNOSIS — Z79899 Other long term (current) drug therapy: Secondary | ICD-10-CM | POA: Insufficient documentation

## 2017-12-23 DIAGNOSIS — D075 Carcinoma in situ of prostate: Secondary | ICD-10-CM | POA: Diagnosis not present

## 2017-12-23 DIAGNOSIS — M5481 Occipital neuralgia: Secondary | ICD-10-CM | POA: Insufficient documentation

## 2017-12-23 DIAGNOSIS — Z5181 Encounter for therapeutic drug level monitoring: Secondary | ICD-10-CM | POA: Diagnosis not present

## 2017-12-23 DIAGNOSIS — I209 Angina pectoris, unspecified: Secondary | ICD-10-CM | POA: Diagnosis not present

## 2017-12-23 DIAGNOSIS — M25561 Pain in right knee: Secondary | ICD-10-CM

## 2017-12-23 DIAGNOSIS — M47892 Other spondylosis, cervical region: Secondary | ICD-10-CM | POA: Diagnosis not present

## 2017-12-23 DIAGNOSIS — M47812 Spondylosis without myelopathy or radiculopathy, cervical region: Secondary | ICD-10-CM | POA: Diagnosis not present

## 2017-12-23 DIAGNOSIS — Z8042 Family history of malignant neoplasm of prostate: Secondary | ICD-10-CM | POA: Insufficient documentation

## 2017-12-23 DIAGNOSIS — M25562 Pain in left knee: Secondary | ICD-10-CM

## 2017-12-23 DIAGNOSIS — M170 Bilateral primary osteoarthritis of knee: Secondary | ICD-10-CM | POA: Diagnosis not present

## 2017-12-23 DIAGNOSIS — M1711 Unilateral primary osteoarthritis, right knee: Secondary | ICD-10-CM | POA: Diagnosis not present

## 2017-12-23 DIAGNOSIS — G8929 Other chronic pain: Secondary | ICD-10-CM

## 2017-12-23 NOTE — Progress Notes (Signed)
Safety precautions to be maintained throughout the outpatient stay will include: orient to surroundings, keep bed in low position, maintain call bell within reach at all times, provide assistance with transfer out of bed and ambulation.  

## 2017-12-23 NOTE — Patient Instructions (Addendum)
____________________________________________________________________________________________  Pain Scale  Introduction: The pain score used by this practice is the Verbal Numerical Rating Scale (VNRS-11). This is an 11-point scale. It is for adults and children 10 years or older. There are significant differences in how the pain score is reported, used, and applied. Forget everything you learned in the past and learn this scoring system.  General Information: The scale should reflect your current level of pain. Unless you are specifically asked for the level of your worst pain, or your average pain. If you are asked for one of these two, then it should be understood that it is over the past 24 hours.  Basic Activities of Daily Living (ADL): Personal hygiene, dressing, eating, transferring, and using restroom.  Instructions: Most patients tend to report their level of pain as a combination of two factors, their physical pain and their psychosocial pain. This last one is also known as "suffering" and it is reflection of how physical pain affects you socially and psychologically. From now on, report them separately. From this point on, when asked to report your pain level, report only your physical pain. Use the following table for reference.  Pain Clinic Pain Levels (0-5/10)  Pain Level Score  Description  No Pain 0   Mild pain 1 Nagging, annoying, but does not interfere with basic activities of daily living (ADL). Patients are able to eat, bathe, get dressed, toileting (being able to get on and off the toilet and perform personal hygiene functions), transfer (move in and out of bed or a chair without assistance), and maintain continence (able to control bladder and bowel functions). Blood pressure and heart rate are unaffected. A normal heart rate for a healthy adult ranges from 60 to 100 bpm (beats per minute).   Mild to moderate pain 2 Noticeable and distracting. Impossible to hide from other  people. More frequent flare-ups. Still possible to adapt and function close to normal. It can be very annoying and may have occasional stronger flare-ups. With discipline, patients may get used to it and adapt.   Moderate pain 3 Interferes significantly with activities of daily living (ADL). It becomes difficult to feed, bathe, get dressed, get on and off the toilet or to perform personal hygiene functions. Difficult to get in and out of bed or a chair without assistance. Very distracting. With effort, it can be ignored when deeply involved in activities.   Moderately severe pain 4 Impossible to ignore for more than a few minutes. With effort, patients may still be able to manage work or participate in some social activities. Very difficult to concentrate. Signs of autonomic nervous system discharge are evident: dilated pupils (mydriasis); mild sweating (diaphoresis); sleep interference. Heart rate becomes elevated (>115 bpm). Diastolic blood pressure (lower number) rises above 100 mmHg. Patients find relief in laying down and not moving.   Severe pain 5 Intense and extremely unpleasant. Associated with frowning face and frequent crying. Pain overwhelms the senses.  Ability to do any activity or maintain social relationships becomes significantly limited. Conversation becomes difficult. Pacing back and forth is common, as getting into a comfortable position is nearly impossible. Pain wakes you up from deep sleep. Physical signs will be obvious: pupillary dilation; increased sweating; goosebumps; brisk reflexes; cold, clammy hands and feet; nausea, vomiting or dry heaves; loss of appetite; significant sleep disturbance with inability to fall asleep or to remain asleep. When persistent, significant weight loss is observed due to the complete loss of appetite and sleep deprivation.  Blood  pressure and heart rate becomes significantly elevated. Caution: If elevated blood pressure triggers a pounding headache  associated with blurred vision, then the patient should immediately seek attention at an urgent or emergency care unit, as these may be signs of an impending stroke.    Emergency Department Pain Levels (6-10/10)  Emergency Room Pain 6 Severely limiting. Requires emergency care and should not be seen or managed at an outpatient pain management facility. Communication becomes difficult and requires great effort. Assistance to reach the emergency department may be required. Facial flushing and profuse sweating along with potentially dangerous increases in heart rate and blood pressure will be evident.   Distressing pain 7 Self-care is very difficult. Assistance is required to transport, or use restroom. Assistance to reach the emergency department will be required. Tasks requiring coordination, such as bathing and getting dressed become very difficult.   Disabling pain 8 Self-care is no longer possible. At this level, pain is disabling. The individual is unable to do even the most "basic" activities such as walking, eating, bathing, dressing, transferring to a bed, or toileting. Fine motor skills are lost. It is difficult to think clearly.   Incapacitating pain 9 Pain becomes incapacitating. Thought processing is no longer possible. Difficult to remember your own name. Control of movement and coordination are lost.   The worst pain imaginable 10 At this level, most patients pass out from pain. When this level is reached, collapse of the autonomic nervous system occurs, leading to a sudden drop in blood pressure and heart rate. This in turn results in a temporary and dramatic drop in blood flow to the brain, leading to a loss of consciousness. Fainting is one of the body's self defense mechanisms. Passing out puts the brain in a calmed state and causes it to shut down for a while, in order to begin the healing process.    Summary: 1. Refer to this scale when providing [place] with your pain level. 2. Be  accurate and careful when reporting your pain level. This will help with your care. 3. Over-reporting your pain level will lead to loss of credibility. 4. Even a level of 1/10 means that there is pain and will be treated at our facility. 5. High, inaccurate reporting will be documented as "Symptom Exaggeration", leading to loss of credibility and suspicions of possible secondary gains such as obtaining more narcotics, or wanting to appear disabled, for fraudulent reasons. 6. Only pain levels of 5 or below will be seen at our facility. 7. Pain levels of 6 and above will be sent to the Emergency Department and the appointment cancelled. ____________________________________________________________________________________________   ____________________________________________________________________________________________  Preparing for your procedure (without sedation)  Instructions: . Oral Intake: Do not eat or drink anything for at least 3 hours prior to your procedure. . Transportation: Unless otherwise stated by your physician, you may drive yourself after the procedure. . Blood Pressure Medicine: Take your blood pressure medicine with a sip of water the morning of the procedure. . Blood thinners: Notify our staff if you are taking any blood thinners. Depending on which one you take, there will be specific instructions on how and when to stop it. . Diabetics on insulin: Notify the staff so that you can be scheduled 1st case in the morning. If your diabetes requires high dose insulin, take only  of your normal insulin dose the morning of the procedure and notify the staff that you have done so. . Preventing infections: Shower with an antibacterial soap the  morning of your procedure.  . Build-up your immune system: Take 1000 mg of Vitamin C with every meal (3 times a day) the day prior to your procedure. Marland Kitchen Antibiotics: Inform the staff if you have a condition or reason that requires you to take  antibiotics before dental procedures. . Pregnancy: If you are pregnant, call and cancel the procedure. . Sickness: If you have a cold, fever, or any active infections, call and cancel the procedure. . Arrival: You must be in the facility at least 30 minutes prior to your scheduled procedure. . Children: Do not bring any children with you. . Dress appropriately: Bring dark clothing that you would not mind if they get stained. . Valuables: Do not bring any jewelry or valuables.  Procedure appointments are reserved for interventional treatments only. Marland Kitchen No Prescription Refills. . No medication changes will be discussed during procedure appointments. . No disability issues will be discussed.  Reasons to call and reschedule or cancel your procedure: (Following these recommendations will minimize the risk of a serious complication.) . Surgeries: Avoid having procedures within 2 weeks of any surgery. (Avoid for 2 weeks before or after any surgery). . Flu Shots: Avoid having procedures within 2 weeks of a flu shots or . (Avoid for 2 weeks before or after immunizations). . Barium: Avoid having a procedure within 7-10 days after having had a radiological study involving the use of radiological contrast. (Myelograms, Barium swallow or enema study). . Heart attacks: Avoid any elective procedures or surgeries for the initial 6 months after a "Myocardial Infarction" (Heart Attack). . Blood thinners: It is imperative that you stop these medications before procedures. Let us know if you if you take any blood thinner.  . Infection: Avoid procedures during or within two weeks of an infection (including chest colds or gastrointestinal problems). Symptoms associated with infections include: Localized redness, fever, chills, night sweats or profuse sweating, burning sensation when voiding, cough, congestion, stuffiness, runny nose, sore throat, diarrhea, nausea, vomiting, cold or Flu symptoms, recent or current  infections. It is specially important if the infection is over the area that we intend to treat. Marland Kitchen Heart and lung problems: Symptoms that may suggest an active cardiopulmonary problem include: cough, chest pain, breathing difficulties or shortness of breath, dizziness, ankle swelling, uncontrolled high or unusually low blood pressure, and/or palpitations. If you are experiencing any of these symptoms, cancel your procedure and contact your primary care physician for an evaluation.  Remember:  Regular Business hours are:  Monday to Thursday 8:00 AM to 4:00 PM  Provider's Schedule: Milinda Pointer, MD:  Procedure days: Tuesday and Thursday 7:30 AM to 4:00 PM  Gillis Santa, MD:  Procedure days: Monday and Wednesday 7:30 AM to 4:00 PM ____________________________________________________________________________________________

## 2018-01-13 DIAGNOSIS — Z96651 Presence of right artificial knee joint: Secondary | ICD-10-CM | POA: Insufficient documentation

## 2018-01-13 DIAGNOSIS — Z6830 Body mass index (BMI) 30.0-30.9, adult: Secondary | ICD-10-CM | POA: Insufficient documentation

## 2018-01-13 DIAGNOSIS — M174 Other bilateral secondary osteoarthritis of knee: Secondary | ICD-10-CM | POA: Insufficient documentation

## 2018-01-22 ENCOUNTER — Telehealth: Payer: Self-pay | Admitting: Nurse Practitioner

## 2018-01-22 NOTE — Telephone Encounter (Signed)
Have not received request.

## 2018-01-22 NOTE — Telephone Encounter (Signed)
Wife called stating patient is unable to pick up meds from pharmacy. Needs prior auth? Pharmacy supposed to fax notice on 01-21-18. Please call [phone] and let her know when he can pick up meds. He is out.

## 2018-01-25 NOTE — Telephone Encounter (Signed)
Voicemail left that PA has been completed and received favorable outcome.

## 2018-01-25 NOTE — Telephone Encounter (Signed)
I sent today by cover my meds.  States is has a favorable outcome.

## 2018-01-25 NOTE — Telephone Encounter (Signed)
Wife called again to check on PA, pharmacy told her they faxed on Wed 01-20-18

## 2018-02-11 ENCOUNTER — Ambulatory Visit: Payer: 59 | Attending: Nurse Practitioner | Admitting: Nurse Practitioner

## 2018-02-11 ENCOUNTER — Encounter: Payer: Self-pay | Admitting: Nurse Practitioner

## 2018-02-11 ENCOUNTER — Other Ambulatory Visit: Payer: Self-pay

## 2018-02-11 VITALS — BP 127/88 | HR 66 | Temp 98.8°F | Ht 70.0 in | Wt 200.0 lb

## 2018-02-11 DIAGNOSIS — M25511 Pain in right shoulder: Secondary | ICD-10-CM | POA: Diagnosis not present

## 2018-02-11 DIAGNOSIS — M5030 Other cervical disc degeneration, unspecified cervical region: Secondary | ICD-10-CM | POA: Insufficient documentation

## 2018-02-11 DIAGNOSIS — M1711 Unilateral primary osteoarthritis, right knee: Secondary | ICD-10-CM

## 2018-02-11 DIAGNOSIS — M25561 Pain in right knee: Secondary | ICD-10-CM | POA: Diagnosis present

## 2018-02-11 DIAGNOSIS — Z79891 Long term (current) use of opiate analgesic: Secondary | ICD-10-CM | POA: Diagnosis not present

## 2018-02-11 DIAGNOSIS — D075 Carcinoma in situ of prostate: Secondary | ICD-10-CM | POA: Diagnosis not present

## 2018-02-11 DIAGNOSIS — N400 Enlarged prostate without lower urinary tract symptoms: Secondary | ICD-10-CM | POA: Insufficient documentation

## 2018-02-11 DIAGNOSIS — K50911 Crohn's disease, unspecified, with rectal bleeding: Secondary | ICD-10-CM | POA: Diagnosis not present

## 2018-02-11 DIAGNOSIS — M5481 Occipital neuralgia: Secondary | ICD-10-CM | POA: Insufficient documentation

## 2018-02-11 DIAGNOSIS — M47812 Spondylosis without myelopathy or radiculopathy, cervical region: Secondary | ICD-10-CM | POA: Insufficient documentation

## 2018-02-11 DIAGNOSIS — M4802 Spinal stenosis, cervical region: Secondary | ICD-10-CM | POA: Insufficient documentation

## 2018-02-11 DIAGNOSIS — Z8042 Family history of malignant neoplasm of prostate: Secondary | ICD-10-CM | POA: Insufficient documentation

## 2018-02-11 DIAGNOSIS — G894 Chronic pain syndrome: Secondary | ICD-10-CM | POA: Diagnosis not present

## 2018-02-11 DIAGNOSIS — M170 Bilateral primary osteoarthritis of knee: Secondary | ICD-10-CM | POA: Insufficient documentation

## 2018-02-11 MED ORDER — TRAMADOL HCL 50 MG PO TABS: 50.0000 mg | ORAL_TABLET | Freq: Two times a day (BID) | ORAL | 2 refills | Status: DC

## 2018-02-11 NOTE — Progress Notes (Signed)
Patient's Name: Samuel Roberts  MRN: [id number]  Referring Provider: [name], MD  DOB: [birth year]  PCP: [name], MD  DOS: 02/11/2018  Note by: Vevelyn Francois NP  Service setting: Ambulatory outpatient  Specialty: Interventional Pain Management  Location: [facility] (AMB) Pain Management Facility    Patient type: Established    Primary Reason(s) for Visit: Encounter for prescription drug management. (Level of risk: moderate)  CC: Knee Pain (right)  HPI  Samuel Roberts is a 62 y.o. year old, male patient, who comes today for a medication management evaluation. He has Angina pectoris ([place]); Crohn's colitis, with rectal bleeding ([place]); BPH (benign prostatic hyperplasia); Carcinoma in situ of prostate; Elevated prostate specific antigen (PSA); Family history of malignant neoplasm of prostate; Arthropathy of knee (Right); Enlarged prostate with lower urinary tract symptoms (LUTS); Chronic neck pain  (Primary Area of Pain) (Bilateral) (L>R); Occipital neuralgia (Tertiary Area of Pain) (Left); Chronic shoulder pain (Secondary Area of Pain) (Left); Chronic low back pain (Fourth Area of Pain) (Right) without sciatica; Chronic knee pain (Fifth Area of Pain) (Bilateral) (R>L); Chronic pain syndrome; Long term current use of opiate analgesic; Pharmacologic therapy; Disorder of skeletal system; Problems influencing health status; Pain of left humerus; Chronic upper extremity pain (Left); DDD (degenerative disc disease), cervical; Cervical spondylosis; 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 His primarily concern today is the Knee Pain (right)  Pain Assessment: Location: Right Knee Radiating: Denies Onset: More than a month ago Duration: Chronic pain Quality: Aching,  Throbbing Severity: 7 /10 (subjective, self-reported pain score)  Note: Reported level is compatible with observation. Clinically the patient looks like a 1/10 A 1/10 is viewed as "Mild" and described as nagging, annoying, but not interfering with basic activities of daily living (ADL). Samuel Roberts is able to eat, bathe, get dressed, do toileting (being able to get on and off the toilet and perform personal hygiene functions), transfer (move in and out of bed or a chair without assistance), and maintain continence (able to control bladder and bowel functions). Physiologic parameters such as blood pressure and heart rate apear wnl.       Effect on ADL: limits my daily activites Timing: Constant Modifying factors: ice and medications BP: 127/88  HR: 66  Samuel Roberts was last scheduled for an appointment on 01/22/2018 for medication management. During today's appointment we reviewed Samuel Roberts's chronic pain status, as well as his outpatient medication regimen. He admits that his pain is the same. His knee pain is a 7/10. He states that this always hurts. His neck and shoulder pain has improved with his last intervention, CESI.Marland Kitchen He denies any side effects of his Tramadol.   The patient  reports that he does not use drugs. His body mass index is 28.7 kg/m.  Further details on both, my assessment(s), as well as the proposed treatment plan, please see below.  Controlled Substance Pharmacotherapy Assessment REMS (Risk Evaluation and Mitigation Strategy)  Analgesic:Tramadol 50 twice daily tramadol 100 mg per day MME/day:189m/day.  BChauncey Fischer RN  02/11/2018  8:51 AM  Sign at close encounter Nursing Pain Medication Assessment:  Safety precautions to be maintained throughout the outpatient stay will include: orient to surroundings, keep bed in low position, maintain call bell within reach at all times, provide assistance with transfer out of bed and ambulation.  Medication  Inspection Compliance:  Pill count conducted under aseptic conditions, in front of the patient. Neither the pills nor the bottle was removed from the patient's sight at any time. Once count was completed pills were immediately returned to the patient in their original bottle.  Medication: Tramadol (Ultram) Pill/Patch Count: 14 of 60 pills remain Pill/Patch Appearance: Markings consistent with prescribed medication Bottle Appearance: Standard pharmacy container. Clearly labeled. Filled Date: 10 / 7 / 2019 Last Medication intake:  Today   Pharmacokinetics: Liberation and absorption (onset of action): WNL Distribution (time to peak effect): WNL Metabolism and excretion (duration of action): WNL         Pharmacodynamics: Desired effects: Analgesia: Samuel Roberts reports >50% benefit. Functional ability: Patient reports that medication allows him to accomplish basic ADLs Clinically meaningful improvement in function (CMIF): Sustained CMIF goals met Perceived effectiveness: Described as relatively effective, allowing for increase in activities of daily living (ADL) Undesirable effects: Side-effects or Adverse reactions: None reported Monitoring: [place] PMP: Online review of the past 65-monthperiod conducted. Compliant with practice rules and regulations Last UDS on record: Summary  Date Value Ref Range Status  06/24/2017 FINAL  Final    Comment:    ==================================================================== TOXASSURE COMP DRUG ANALYSIS,UR ==================================================================== Test                             Result       Flag       Units Drug Present and Declared for Prescription Verification   Tramadol                       >2083        EXPECTED   ng/mg creat   O-Desmethyltramadol            >2083        EXPECTED   ng/mg creat   N-Desmethyltramadol            1305         EXPECTED   ng/mg creat    Source of tramadol is a prescription medication.    O-desmethyltramadol and  N-desmethyltramadol are expected    metabolites of tramadol. Drug Present not Declared for Prescription Verification   Acetaminophen                  PRESENT      UNEXPECTED   Lidocaine                      PRESENT      UNEXPECTED Drug Absent but Declared for Prescription Verification   Metoprolol                     Not Detected UNEXPECTED ==================================================================== Test                      Result    Flag   Units      Ref Range   Creatinine              240              mg/dL      >=20 ==================================================================== Declared Medications:  The flagging and interpretation on this report are based on the  following declared medications.  Unexpected results may arise from  inaccuracies in the declared medications.  **Note: The testing scope of this panel includes these medications:  Metoprolol (Lopressor)  Tramadol (Ultram)  **Note: The testing scope of this panel does not include following  reported medications:  Pantoprazole (Protonix) ==================================================================== For clinical consultation, please call [phone]. ====================================================================    UDS interpretation: Compliant          Medication Assessment Form: Reviewed. Patient indicates being compliant with therapy Treatment compliance: Compliant Risk Assessment Profile: Aberrant behavior: See prior evaluations. None observed or detected today Comorbid factors increasing risk of overdose: See prior notes. Roberts additional risks detected today Opioid risk tool (ORT) (Total Score): 0 Personal History of Substance Abuse (SUD-Substance use disorder):  Alcohol: Negative  Illegal Drugs: Negative  Rx Drugs: Negative  ORT Risk Level calculation: Low Risk Risk of substance use disorder (SUD): Low Opioid Risk Tool - 02/11/18 0857      Family History of Substance Abuse    Alcohol  Negative    Illegal Drugs  Negative    Rx Drugs  Negative      Personal History of Substance Abuse   Alcohol  Negative    Illegal Drugs  Negative    Rx Drugs  Negative      Age   Age between 80-45 years   Roberts      History of Preadolescent Sexual Abuse   History of Preadolescent Sexual Abuse  Negative or Male      Psychological Disease   Psychological Disease  Negative    Depression  Negative      Total Score   Opioid Risk Tool Scoring  0    Opioid Risk Interpretation  Low Risk      ORT Scoring interpretation table:  Score <3 = Low Risk for SUD  Score between 4-7 = Moderate Risk for SUD  Score >8 = High Risk for Opioid Abuse   Risk Mitigation Strategies:  Patient Counseling: Covered Patient-Prescriber Agreement (PPA): Present and active  Notification to other healthcare providers: Done  Pharmacologic Plan: Roberts change in therapy, at this time.             Laboratory Chemistry  Inflammation Markers (CRP: Acute Phase) (ESR: Chronic Phase) Lab Results  Component Value Date   CRP 2.8 06/24/2017   ESRSEDRATE 11 06/24/2017                         Rheumatology Markers Roberts results found for: RF, ANA, LABURIC, URICUR, LYMEIGGIGMAB, LYMEABIGMQN, HLAB27                      Renal Function Markers Lab Results  Component Value Date   BUN 17 09/06/2016   CREATININE 0.73 09/06/2016   GFRAA >60 09/06/2016   GFRNONAA >60 09/06/2016                             Hepatic Function Markers Lab Results  Component Value Date   AST 27 09/05/2016   ALT 15 (L) 09/05/2016   ALBUMIN 4.1 09/05/2016   ALKPHOS 62 09/05/2016                        Electrolytes Lab Results  Component Value Date   NA 138 09/06/2016   K 5.1 09/06/2016   CL 103 09/06/2016   CALCIUM 9.3 09/06/2016   MG 2.4 (H) 06/24/2017                        Neuropathy Markers  Roberts results found for: VITAMINB12, FOLATE, HGBA1C, HIV                      CNS Tests Roberts results found for: COLORCSF, APPEARCSF,  RBCCOUNTCSF, WBCCSF, POLYSCSF, LYMPHSCSF, EOSCSF, PROTEINCSF, GLUCCSF, JCVIRUS, CSFOLI, IGGCSF                      Bone Pathology Markers Lab Results  Component Value Date   25OHVITD1 37 06/24/2017   25OHVITD2 2.2 06/24/2017   25OHVITD3 35 06/24/2017                         Coagulation Parameters Lab Results  Component Value Date   PLT 306 09/06/2016                        Cardiovascular Markers Lab Results  Component Value Date   TROPONINI <0.03 09/06/2016   HGB 14.1 09/06/2016   HCT 40.7 09/06/2016                         CA Markers Roberts results found for: CEA, CA125, LABCA2                      Note: Lab results reviewed.  Recent Diagnostic Imaging Results  DG C-Arm 1-60 Min-Roberts Report Fluoroscopy was utilized by the requesting physician.  Roberts radiographic  interpretation.   Complexity Note: Imaging results reviewed. Results shared with Mr. Staiger, using Layman's terms.                         Meds   Current Outpatient Medications:  .  mesalamine (LIALDA) 1.2 g EC tablet, Take 1.2 g by mouth 2 (two) times daily., Disp: , Rfl:  .  pantoprazole (PROTONIX) 40 MG tablet, Take 40 mg by mouth every other day. , Disp: , Rfl: 0 .  [START ON 02/15/2018] traMADol (ULTRAM) 50 MG tablet, Take 1 tablet (50 mg total) by mouth 2 (two) times daily., Disp: 60 tablet, Rfl: 2  ROS  Constitutional: Denies any fever or chills Gastrointestinal: Roberts reported hemesis, hematochezia, vomiting, or acute GI distress Musculoskeletal: Denies any acute onset joint swelling, redness, loss of ROM, or weakness Neurological: Roberts reported episodes of acute onset apraxia, aphasia, dysarthria, agnosia, amnesia, paralysis, loss of coordination, or loss of consciousness  Allergies  Mr. Camacho has Roberts Known Allergies.  [place]  Drug: Mr. Heist  reports that he does not use drugs. Alcohol:  reports that he does not drink alcohol. Tobacco:  reports that he has never smoked. He has never used smokeless  tobacco. Medical:  has a past medical history of BPH (benign prostatic hyperplasia) and Colitis, ulcerative ([place]). Surgical: Mr. Vandergriff  has a past surgical history that includes Knee arthroscopy and arthrotomy; Tonsillectomy; LEFT HEART CATH AND CORONARY ANGIOGRAPHY (Left, 09/09/2016); and Joint replacement (Right). Family: family history includes Cancer in his father; Cirrhosis in his brother; Diabetes in his mother, sister, and sister; Drug abuse in his brother and brother; Heart disease in his brother and brother; Heart failure in his father and mother.  Constitutional Exam  General appearance: Well nourished, well developed, and well hydrated. In Roberts apparent acute distress Vitals:   02/11/18 0851  BP: 127/88  Pulse: 66  Temp: 98.8 F (37.1 C)  SpO2: 100%  Weight: 200 lb (90.7 kg)  Height:  5' 10"  (1.778 m)  Psych/Mental status: Alert, oriented x 3 (person, place, & time)       Eyes: PERLA Respiratory: Roberts evidence of acute respiratory distress  Cervical Spine Area Exam  Skin & Axial Inspection: Roberts masses, redness, edema, swelling, or associated skin lesions Alignment: Symmetrical Functional ROM: Adequate ROM      Stability: Roberts instability detected Muscle Tone/Strength: Functionally intact. Roberts obvious neuro-muscular anomalies detected. Sensory (Neurological): Unimpaired Palpation: Non-tender              Upper Extremity (UE) Exam    Side: Right upper extremity  Side: Left upper extremity  Skin & Extremity Inspection: Skin color, temperature, and hair growth are WNL. Roberts peripheral edema or cyanosis. Roberts masses, redness, swelling, asymmetry, or associated skin lesions. Roberts contractures.  Skin & Extremity Inspection: Skin color, temperature, and hair growth are WNL. Roberts peripheral edema or cyanosis. Roberts masses, redness, swelling, asymmetry, or associated skin lesions. Roberts contractures.  Functional ROM: Unrestricted ROM          Functional ROM: Unrestricted ROM          Muscle Tone/Strength:  Functionally intact. Roberts obvious neuro-muscular anomalies detected.  Muscle Tone/Strength: Functionally intact. Roberts obvious neuro-muscular anomalies detected.  Sensory (Neurological): Unimpaired          Sensory (Neurological): Unimpaired          Palpation: Roberts palpable anomalies              Palpation: Roberts palpable anomalies              Provocative Test(s):  Phalen's test: deferred Tinel's test: deferred Apley's scratch test (touch opposite shoulder):  Action 1 (Across chest): deferred Action 2 (Overhead): deferred Action 3 (LB reach): deferred   Provocative Test(s):  Phalen's test: deferred Tinel's test: deferred Apley's scratch test (touch opposite shoulder):  Action 1 (Across chest): deferred Action 2 (Overhead): deferred Action 3 (LB reach): deferred    Gait & Posture Assessment  Ambulation: Unassisted Gait: Relatively normal for age and body habitus Posture: WNL   Lower Extremity Exam    Side: Right lower extremity  Side: Left lower extremity  Stability: Roberts instability observed          Stability: Roberts instability observed          Skin & Extremity Inspection: Evidence of prior arthroplastic surgery  Skin & Extremity Inspection: Skin color, temperature, and hair growth are WNL. Roberts peripheral edema or cyanosis. Roberts masses, redness, swelling, asymmetry, or associated skin lesions. Roberts contractures.  Functional ROM: Unrestricted ROM                  Functional ROM: Unrestricted ROM                  Muscle Tone/Strength: Functionally intact. Roberts obvious neuro-muscular anomalies detected.  Muscle Tone/Strength: Functionally intact. Roberts obvious neuro-muscular anomalies detected.  Sensory (Neurological): Unimpaired  Sensory (Neurological): Unimpaired  Palpation: Roberts palpable anomalies  Palpation: Roberts palpable anomalies   Assessment  Primary Diagnosis & Pertinent Problem List: The primary encounter diagnosis was Tricompartment osteoarthritis of knee (Right). Diagnoses of Cervical  spondylosis, DDD (degenerative disc disease), cervical, Chronic pain syndrome, and Long term prescription opiate use were also pertinent to this visit.  Status Diagnosis  Persistent Improved Improved 1. Tricompartment osteoarthritis of knee (Right)   2. Cervical spondylosis   3. DDD (degenerative disc disease), cervical   4. Chronic pain syndrome   5. Long term prescription opiate use  Problems updated and reviewed during this visit: Problem  Bmi 30.0-30.9,Adult  History of Repair of Acl  Other Bilateral Secondary Osteoarthritis of Knee   Plan of Care  Pharmacotherapy (Medications Ordered): Meds ordered this encounter  Medications  . traMADol (ULTRAM) 50 MG tablet    Sig: Take 1 tablet (50 mg total) by mouth 2 (two) times daily.    Dispense:  60 tablet    Refill:  2    Do not add this medication to the electronic "Automatic Refill" notification system. Patient may have prescription filled one day early if pharmacy is closed on scheduled refill date.    Order Specific Question:   Supervising Provider    Answer:   Milinda Pointer [[id number]]   New Prescriptions   Roberts medications on file   Medications administered today: [place] Early. Pankonin had Roberts medications administered during this visit. Lab-work, procedure(s), and/or referral(s): Orders Placed This Encounter  Procedures  . ToxASSURE Select 13 (MW), Urine   Imaging and/or referral(s): None  Interventional management options: Planned, scheduled, and/or pending:   None at this time   Considering:   Diagnostic left-sided cervical facet nerve block Possible left-sided cervical facet RFA Diagnosticleft suprascapular nerve block Diagnostictrigger point injection Diagnosticright lumbar facet nerve block Diagnostic right lumbar facet RFA Diagnostic Left knee Hyalgan series Diagnostic Right knee genicular nerve block Possible right knee genicular RFA   Palliative PRN treatment(s):   Diagnostic left-sided  cervical epidural steroid injection #3 (PRN)    Provider-requested follow-up: Return in about 3 months (around 05/14/2018) for MedMgmt.  Future Appointments  Date Time Provider [place]  05/12/2018  8:30 AM Vevelyn Francois, NP [facility] None   Primary Care Physician: [name], MD Location: [facility] Outpatient Pain Management Facility Note by: Vevelyn Francois NP Date: 02/11/2018; Time: 10:41 AM  Pain Score Disclaimer: We use the NRS-11 scale. This is a self-reported, subjective measurement of pain severity with only modest accuracy. It is used primarily to identify changes within a particular patient. It must be understood that outpatient pain scales are significantly less accurate that those used for research, where they can be applied under ideal controlled circumstances with minimal exposure to variables. In reality, the score is likely to be a combination of pain intensity and pain affect, where pain affect describes the degree of emotional arousal or changes in action readiness caused by the sensory experience of pain. Factors such as social and work situation, setting, emotional state, anxiety levels, expectation, and prior pain experience may influence pain perception and show large inter-individual differences that may also be affected by time variables.  Patient instructions provided during this appointment: Patient Instructions   ____________________________________________________________________________________________  Medication Rules  Applies to: All patients receiving prescriptions (written or electronic).  Pharmacy of record: Pharmacy where electronic prescriptions will be sent. If written prescriptions are taken to a different pharmacy, please inform the nursing staff. The pharmacy listed in the electronic medical record should be the one where you would like electronic prescriptions to be sent.  Prescription refills: Only during scheduled appointments.  Applies to both, written and electronic prescriptions.  NOTE: The following applies primarily to controlled substances (Opioid* Pain Medications).   Patient's responsibilities: 1. Pain Pills: Bring all pain pills to every appointment (except for procedure appointments). 2. Pill Bottles: Bring pills in original pharmacy bottle. Always bring newest bottle. Bring bottle, even if empty. 3. Medication refills: You are responsible for knowing and keeping track of what medications you need refilled. The day before  your appointment, write a list of all prescriptions that need to be refilled. Bring that list to your appointment and give it to the admitting nurse. Prescriptions will be written only during appointments. If you forget a medication, it will not be "Called in", "Faxed", or "electronically sent". You will need to get another appointment to get these prescribed. 4. Prescription Accuracy: You are responsible for carefully inspecting your prescriptions before leaving our office. Have the discharge nurse carefully go over each prescription with you, before taking them home. Make sure that your name is accurately spelled, that your address is correct. Check the name and dose of your medication to make sure it is accurate. Check the number of pills, and the written instructions to make sure they are clear and accurate. Make sure that you are given enough medication to last until your next medication refill appointment. 5. Taking Medication: Take medication as prescribed. Never take more pills than instructed. Never take medication more frequently than prescribed. Taking less pills or less frequently is permitted and encouraged, when it comes to controlled substances (written prescriptions).  6. Inform other Doctors: Always inform, all of your healthcare providers, of all the medications you take. 7. Pain Medication from other Providers: You are not allowed to accept any additional pain medication from any  other Doctor or Healthcare provider. There are two exceptions to this rule. (see below) In the event that you require additional pain medication, you are responsible for notifying us, as stated below. 8. Medication Agreement: You are responsible for carefully reading and following our Medication Agreement. This must be signed before receiving any prescriptions from our practice. Safely store a copy of your signed Agreement. Violations to the Agreement will result in Roberts further prescriptions. (Additional copies of our Medication Agreement are available upon request.) 9. Laws, Rules, & Regulations: All patients are expected to follow all Federal and [employer], [employer], Rules, [employer]. Ignorance of the Laws does not constitute a valid excuse. The use of any illegal substances is prohibited. 10. Adopted CDC guidelines & recommendations: Target dosing levels will be at or below 60 MME/day. Use of benzodiazepines** is not recommended.  Exceptions: There are only two exceptions to the rule of not receiving pain medications from other Healthcare Providers. 1. Exception #1 (Emergencies): In the event of an emergency (i.e.: accident requiring emergency care), you are allowed to receive additional pain medication. However, you are responsible for: As soon as you are able, call our office [phone], at any time of the day or night, and leave a message stating your name, the date and nature of the emergency, and the name and dose of the medication prescribed. In the event that your call is answered by a member of our staff, make sure to document and save the date, time, and the name of the person that took your information.  2. Exception #2 (Planned Surgery): In the event that you are scheduled by another doctor or dentist to have any type of surgery or procedure, you are allowed (for a period Roberts longer than 30 days), to receive additional pain medication, for the acute post-op pain. However, in this case,  you are responsible for picking up a copy of our "Post-op Pain Management for Surgeons" handout, and giving it to your surgeon or dentist. This document is available at our office, and does not require an appointment to obtain it. Simply go to our office during business hours (Monday-Thursday from 8:00 AM to 4:00 PM) (Friday 8:00  AM to 12:00 Noon) or if you have a scheduled appointment with [place], prior to your surgery, and ask for it by name. In addition, you will need to provide [place] with your name, name of your surgeon, type of surgery, and date of procedure or surgery.  *Opioid medications include: morphine, codeine, oxycodone, oxymorphone, hydrocodone, hydromorphone, meperidine, tramadol, tapentadol, buprenorphine, fentanyl, methadone. **Benzodiazepine medications include: diazepam (Valium), alprazolam (Xanax), clonazepam (Klonopine), lorazepam (Ativan), clorazepate (Tranxene), chlordiazepoxide (Librium), estazolam (Prosom), oxazepam (Serax), temazepam (Restoril), triazolam (Halcion) (Last updated: 06/18/2017) ____________________________________________________________________________________________  ____________________________________________________________________________________________  Pain Scale  Introduction: The pain score used by this practice is the Verbal Numerical Rating Scale (VNRS-11). This is an 11-point scale. It is for adults and children 10 years or older. There are significant differences in how the pain score is reported, used, and applied. Forget everything you learned in the past and learn this scoring system.  General Information: The scale should reflect your current level of pain. Unless you are specifically asked for the level of your worst pain, or your average pain. If you are asked for one of these two, then it should be understood that it is over the past 24 hours.  Basic Activities of Daily Living (ADL): Personal hygiene, dressing, eating, transferring, and using  restroom.  Instructions: Most patients tend to report their level of pain as a combination of two factors, their physical pain and their psychosocial pain. This last one is also known as "suffering" and it is reflection of how physical pain affects you socially and psychologically. From now on, report them separately. From this point on, when asked to report your pain level, report only your physical pain. Use the following table for reference.  Pain Clinic Pain Levels (0-5/10)  Pain Level Score  Description  Roberts Pain 0   Mild pain 1 Nagging, annoying, but does not interfere with basic activities of daily living (ADL). Patients are able to eat, bathe, get dressed, toileting (being able to get on and off the toilet and perform personal hygiene functions), transfer (move in and out of bed or a chair without assistance), and maintain continence (able to control bladder and bowel functions). Blood pressure and heart rate are unaffected. A normal heart rate for a healthy adult ranges from 60 to 100 bpm (beats per minute).   Mild to moderate pain 2 Noticeable and distracting. Impossible to hide from other people. More frequent flare-ups. Still possible to adapt and function close to normal. It can be very annoying and may have occasional stronger flare-ups. With discipline, patients may get used to it and adapt.   Moderate pain 3 Interferes significantly with activities of daily living (ADL). It becomes difficult to feed, bathe, get dressed, get on and off the toilet or to perform personal hygiene functions. Difficult to get in and out of bed or a chair without assistance. Very distracting. With effort, it can be ignored when deeply involved in activities.   Moderately severe pain 4 Impossible to ignore for more than a few minutes. With effort, patients may still be able to manage work or participate in some social activities. Very difficult to concentrate. Signs of autonomic nervous system discharge are  evident: dilated pupils (mydriasis); mild sweating (diaphoresis); sleep interference. Heart rate becomes elevated (>115 bpm). Diastolic blood pressure (lower number) rises above 100 mmHg. Patients find relief in laying down and not moving.   Severe pain 5 Intense and extremely unpleasant. Associated with frowning face and frequent crying. Pain overwhelms the senses.  Ability  to do any activity or maintain social relationships becomes significantly limited. Conversation becomes difficult. Pacing back and forth is common, as getting into a comfortable position is nearly impossible. Pain wakes you up from deep sleep. Physical signs will be obvious: pupillary dilation; increased sweating; goosebumps; brisk reflexes; cold, clammy hands and feet; nausea, vomiting or dry heaves; loss of appetite; significant sleep disturbance with inability to fall asleep or to remain asleep. When persistent, significant weight loss is observed due to the complete loss of appetite and sleep deprivation.  Blood pressure and heart rate becomes significantly elevated. Caution: If elevated blood pressure triggers a pounding headache associated with blurred vision, then the patient should immediately seek attention at an urgent or emergency care unit, as these may be signs of an impending stroke.    Emergency Department Pain Levels (6-10/10)  Emergency Room Pain 6 Severely limiting. Requires emergency care and should not be seen or managed at an outpatient pain management facility. Communication becomes difficult and requires great effort. Assistance to reach the emergency department may be required. Facial flushing and profuse sweating along with potentially dangerous increases in heart rate and blood pressure will be evident.   Distressing pain 7 Self-care is very difficult. Assistance is required to transport, or use restroom. Assistance to reach the emergency department will be required. Tasks requiring coordination, such as  bathing and getting dressed become very difficult.   Disabling pain 8 Self-care is Roberts longer possible. At this level, pain is disabling. The individual is unable to do even the most "basic" activities such as walking, eating, bathing, dressing, transferring to a bed, or toileting. Fine motor skills are lost. It is difficult to think clearly.   Incapacitating pain 9 Pain becomes incapacitating. Thought processing is Roberts longer possible. Difficult to remember your own name. Control of movement and coordination are lost.   The worst pain imaginable 10 At this level, most patients pass out from pain. When this level is reached, collapse of the autonomic nervous system occurs, leading to a sudden drop in blood pressure and heart rate. This in turn results in a temporary and dramatic drop in blood flow to the brain, leading to a loss of consciousness. Fainting is one of the body's self defense mechanisms. Passing out puts the brain in a calmed state and causes it to shut down for a while, in order to begin the healing process.    Summary: 1. Refer to this scale when providing [place] with your pain level. 2. Be accurate and careful when reporting your pain level. This will help with your care. 3. Over-reporting your pain level will lead to loss of credibility. 4. Even a level of 1/10 means that there is pain and will be treated at our facility. 5. High, inaccurate reporting will be documented as "Symptom Exaggeration", leading to loss of credibility and suspicions of possible secondary gains such as obtaining more narcotics, or wanting to appear disabled, for fraudulent reasons. 6. Only pain levels of 5 or below will be seen at our facility. 7. Pain levels of 6 and above will be sent to the Emergency Department and the appointment cancelled. ____________________________________________________________________________________________    BMI Assessment: Estimated body mass index is 28.7 kg/m as calculated  from the following:   Height as of this encounter: 5' 10"  (1.778 m).   Weight as of this encounter: 200 lb (90.7 kg).  BMI interpretation table: BMI level Category Range association with higher incidence of chronic pain  <18 kg/m2 Underweight  18.5-24.9 kg/m2 Ideal body weight   25-29.9 kg/m2 Overweight Increased incidence by 20%  30-34.9 kg/m2 Obese (Class I) Increased incidence by 68%  35-39.9 kg/m2 Severe obesity (Class II) Increased incidence by 136%  >40 kg/m2 Extreme obesity (Class III) Increased incidence by 254%   Patient's current BMI Ideal Body weight  Body mass index is 28.7 kg/m. Ideal body weight: 73 kg (160 lb 15 oz) Adjusted ideal body weight: 80.1 kg (176 lb 9 oz)   BMI Readings from Last 4 Encounters:  02/11/18 28.70 kg/m  12/23/17 28.70 kg/m  12/16/17 28.70 kg/m  11/12/17 28.70 kg/m   Wt Readings from Last 4 Encounters:  02/11/18 200 lb (90.7 kg)  12/23/17 200 lb (90.7 kg)  12/16/17 200 lb (90.7 kg)  11/12/17 200 lb (90.7 kg)

## 2018-02-11 NOTE — Patient Instructions (Addendum)
____________________________________________________________________________________________  Medication Rules  Applies to: All patients receiving prescriptions (written or electronic).  Pharmacy of record: Pharmacy where electronic prescriptions will be sent. If written prescriptions are taken to a different pharmacy, please inform the nursing staff. The pharmacy listed in the electronic medical record should be the one where you would like electronic prescriptions to be sent.  Prescription refills: Only during scheduled appointments. Applies to both, written and electronic prescriptions.  NOTE: The following applies primarily to controlled substances (Opioid* Pain Medications).   Patient's responsibilities: 1. Pain Pills: Bring all pain pills to every appointment (except for procedure appointments). 2. Pill Bottles: Bring pills in original pharmacy bottle. Always bring newest bottle. Bring bottle, even if empty. 3. Medication refills: You are responsible for knowing and keeping track of what medications you need refilled. The day before your appointment, write a list of all prescriptions that need to be refilled. Bring that list to your appointment and give it to the admitting nurse. Prescriptions will be written only during appointments. If you forget a medication, it will not be "Called in", "Faxed", or "electronically sent". You will need to get another appointment to get these prescribed. 4. Prescription Accuracy: You are responsible for carefully inspecting your prescriptions before leaving our office. Have the discharge nurse carefully go over each prescription with you, before taking them home. Make sure that your name is accurately spelled, that your address is correct. Check the name and dose of your medication to make sure it is accurate. Check the number of pills, and the written instructions to make sure they are clear and accurate. Make sure that you are given enough medication to last  until your next medication refill appointment. 5. Taking Medication: Take medication as prescribed. Never take more pills than instructed. Never take medication more frequently than prescribed. Taking less pills or less frequently is permitted and encouraged, when it comes to controlled substances (written prescriptions).  6. Inform other Doctors: Always inform, all of your healthcare providers, of all the medications you take. 7. Pain Medication from other Providers: You are not allowed to accept any additional pain medication from any other Doctor or Healthcare provider. There are two exceptions to this rule. (see below) In the event that you require additional pain medication, you are responsible for notifying us, as stated below. 8. Medication Agreement: You are responsible for carefully reading and following our Medication Agreement. This must be signed before receiving any prescriptions from our practice. Safely store a copy of your signed Agreement. Violations to the Agreement will result in no further prescriptions. (Additional copies of our Medication Agreement are available upon request.) 9. Laws, Rules, & Regulations: All patients are expected to follow all Federal and [employer], [employer], Rules, [employer]. Ignorance of the Laws does not constitute a valid excuse. The use of any illegal substances is prohibited. 10. Adopted CDC guidelines & recommendations: Target dosing levels will be at or below 60 MME/day. Use of benzodiazepines** is not recommended.  Exceptions: There are only two exceptions to the rule of not receiving pain medications from other Healthcare Providers. 1. Exception #1 (Emergencies): In the event of an emergency (i.e.: accident requiring emergency care), you are allowed to receive additional pain medication. However, you are responsible for: As soon as you are able, call our office [phone], at any time of the day or night, and leave a message stating your name, the  date and nature of the emergency, and the name and dose of the medication  prescribed. In the event that your call is answered by a member of our staff, make sure to document and save the date, time, and the name of the person that took your information.  2. Exception #2 (Planned Surgery): In the event that you are scheduled by another doctor or dentist to have any type of surgery or procedure, you are allowed (for a period no longer than 30 days), to receive additional pain medication, for the acute post-op pain. However, in this case, you are responsible for picking up a copy of our "Post-op Pain Management for Surgeons" handout, and giving it to your surgeon or dentist. This document is available at our office, and does not require an appointment to obtain it. Simply go to our office during business hours (Monday-Thursday from 8:00 AM to 4:00 PM) (Friday 8:00 AM to 12:00 Noon) or if you have a scheduled appointment with [place], prior to your surgery, and ask for it by name. In addition, you will need to provide [place] with your name, name of your surgeon, type of surgery, and date of procedure or surgery.  *Opioid medications include: morphine, codeine, oxycodone, oxymorphone, hydrocodone, hydromorphone, meperidine, tramadol, tapentadol, buprenorphine, fentanyl, methadone. **Benzodiazepine medications include: diazepam (Valium), alprazolam (Xanax), clonazepam (Klonopine), lorazepam (Ativan), clorazepate (Tranxene), chlordiazepoxide (Librium), estazolam (Prosom), oxazepam (Serax), temazepam (Restoril), triazolam (Halcion) (Last updated: 06/18/2017) ____________________________________________________________________________________________  ____________________________________________________________________________________________  Pain Scale  Introduction: The pain score used by this practice is the Verbal Numerical Rating Scale (VNRS-11). This is an 11-point scale. It is for adults and children 10 years or  older. There are significant differences in how the pain score is reported, used, and applied. Forget everything you learned in the past and learn this scoring system.  General Information: The scale should reflect your current level of pain. Unless you are specifically asked for the level of your worst pain, or your average pain. If you are asked for one of these two, then it should be understood that it is over the past 24 hours.  Basic Activities of Daily Living (ADL): Personal hygiene, dressing, eating, transferring, and using restroom.  Instructions: Most patients tend to report their level of pain as a combination of two factors, their physical pain and their psychosocial pain. This last one is also known as "suffering" and it is reflection of how physical pain affects you socially and psychologically. From now on, report them separately. From this point on, when asked to report your pain level, report only your physical pain. Use the following table for reference.  Pain Clinic Pain Levels (0-5/10)  Pain Level Score  Description  No Pain 0   Mild pain 1 Nagging, annoying, but does not interfere with basic activities of daily living (ADL). Patients are able to eat, bathe, get dressed, toileting (being able to get on and off the toilet and perform personal hygiene functions), transfer (move in and out of bed or a chair without assistance), and maintain continence (able to control bladder and bowel functions). Blood pressure and heart rate are unaffected. A normal heart rate for a healthy adult ranges from 60 to 100 bpm (beats per minute).   Mild to moderate pain 2 Noticeable and distracting. Impossible to hide from other people. More frequent flare-ups. Still possible to adapt and function close to normal. It can be very annoying and may have occasional stronger flare-ups. With discipline, patients may get used to it and adapt.   Moderate pain 3 Interferes significantly with activities of daily  living (ADL). It  becomes difficult to feed, bathe, get dressed, get on and off the toilet or to perform personal hygiene functions. Difficult to get in and out of bed or a chair without assistance. Very distracting. With effort, it can be ignored when deeply involved in activities.   Moderately severe pain 4 Impossible to ignore for more than a few minutes. With effort, patients may still be able to manage work or participate in some social activities. Very difficult to concentrate. Signs of autonomic nervous system discharge are evident: dilated pupils (mydriasis); mild sweating (diaphoresis); sleep interference. Heart rate becomes elevated (>115 bpm). Diastolic blood pressure (lower number) rises above 100 mmHg. Patients find relief in laying down and not moving.   Severe pain 5 Intense and extremely unpleasant. Associated with frowning face and frequent crying. Pain overwhelms the senses.  Ability to do any activity or maintain social relationships becomes significantly limited. Conversation becomes difficult. Pacing back and forth is common, as getting into a comfortable position is nearly impossible. Pain wakes you up from deep sleep. Physical signs will be obvious: pupillary dilation; increased sweating; goosebumps; brisk reflexes; cold, clammy hands and feet; nausea, vomiting or dry heaves; loss of appetite; significant sleep disturbance with inability to fall asleep or to remain asleep. When persistent, significant weight loss is observed due to the complete loss of appetite and sleep deprivation.  Blood pressure and heart rate becomes significantly elevated. Caution: If elevated blood pressure triggers a pounding headache associated with blurred vision, then the patient should immediately seek attention at an urgent or emergency care unit, as these may be signs of an impending stroke.    Emergency Department Pain Levels (6-10/10)  Emergency Room Pain 6 Severely limiting. Requires emergency care  and should not be seen or managed at an outpatient pain management facility. Communication becomes difficult and requires great effort. Assistance to reach the emergency department may be required. Facial flushing and profuse sweating along with potentially dangerous increases in heart rate and blood pressure will be evident.   Distressing pain 7 Self-care is very difficult. Assistance is required to transport, or use restroom. Assistance to reach the emergency department will be required. Tasks requiring coordination, such as bathing and getting dressed become very difficult.   Disabling pain 8 Self-care is no longer possible. At this level, pain is disabling. The individual is unable to do even the most "basic" activities such as walking, eating, bathing, dressing, transferring to a bed, or toileting. Fine motor skills are lost. It is difficult to think clearly.   Incapacitating pain 9 Pain becomes incapacitating. Thought processing is no longer possible. Difficult to remember your own name. Control of movement and coordination are lost.   The worst pain imaginable 10 At this level, most patients pass out from pain. When this level is reached, collapse of the autonomic nervous system occurs, leading to a sudden drop in blood pressure and heart rate. This in turn results in a temporary and dramatic drop in blood flow to the brain, leading to a loss of consciousness. Fainting is one of the body's self defense mechanisms. Passing out puts the brain in a calmed state and causes it to shut down for a while, in order to begin the healing process.    Summary: 1. Refer to this scale when providing [place] with your pain level. 2. Be accurate and careful when reporting your pain level. This will help with your care. 3. Over-reporting your pain level will lead to loss of credibility. 4. Even a  level of 1/10 means that there is pain and will be treated at our facility. 5. High, inaccurate reporting will be  documented as "Symptom Exaggeration", leading to loss of credibility and suspicions of possible secondary gains such as obtaining more narcotics, or wanting to appear disabled, for fraudulent reasons. 6. Only pain levels of 5 or below will be seen at our facility. 7. Pain levels of 6 and above will be sent to the Emergency Department and the appointment cancelled. ____________________________________________________________________________________________    BMI Assessment: Estimated body mass index is 28.7 kg/m as calculated from the following:   Height as of this encounter: 5' 10"  (1.778 m).   Weight as of this encounter: 200 lb (90.7 kg).  BMI interpretation table: BMI level Category Range association with higher incidence of chronic pain  <18 kg/m2 Underweight   18.5-24.9 kg/m2 Ideal body weight   25-29.9 kg/m2 Overweight Increased incidence by 20%  30-34.9 kg/m2 Obese (Class I) Increased incidence by 68%  35-39.9 kg/m2 Severe obesity (Class II) Increased incidence by 136%  >40 kg/m2 Extreme obesity (Class III) Increased incidence by 254%   Patient's current BMI Ideal Body weight  Body mass index is 28.7 kg/m. Ideal body weight: 73 kg (160 lb 15 oz) Adjusted ideal body weight: 80.1 kg (176 lb 9 oz)   BMI Readings from Last 4 Encounters:  02/11/18 28.70 kg/m  12/23/17 28.70 kg/m  12/16/17 28.70 kg/m  11/12/17 28.70 kg/m   Wt Readings from Last 4 Encounters:  02/11/18 200 lb (90.7 kg)  12/23/17 200 lb (90.7 kg)  12/16/17 200 lb (90.7 kg)  11/12/17 200 lb (90.7 kg)

## 2018-02-11 NOTE — Progress Notes (Signed)
Nursing Pain Medication Assessment:  Safety precautions to be maintained throughout the outpatient stay will include: orient to surroundings, keep bed in low position, maintain call bell within reach at all times, provide assistance with transfer out of bed and ambulation.  Medication Inspection Compliance: Pill count conducted under aseptic conditions, in front of the patient. Neither the pills nor the bottle was removed from the patient's sight at any time. Once count was completed pills were immediately returned to the patient in their original bottle.  Medication: Tramadol (Ultram) Pill/Patch Count: 14 of 60 pills remain Pill/Patch Appearance: Markings consistent with prescribed medication Bottle Appearance: Standard pharmacy container. Clearly labeled. Filled Date: 10 / 7 / 2019 Last Medication intake:  Today

## 2018-02-15 LAB — TOXASSURE SELECT 13 (MW), URINE

## 2018-02-17 DIAGNOSIS — Z86718 Personal history of other venous thrombosis and embolism: Secondary | ICD-10-CM | POA: Insufficient documentation

## 2018-02-17 DIAGNOSIS — K529 Noninfective gastroenteritis and colitis, unspecified: Secondary | ICD-10-CM | POA: Insufficient documentation

## 2018-02-17 HISTORY — PX: JOINT REPLACEMENT: SHX530

## 2018-04-05 DIAGNOSIS — M6281 Muscle weakness (generalized): Secondary | ICD-10-CM | POA: Insufficient documentation

## 2018-04-22 ENCOUNTER — Telehealth: Payer: Self-pay | Admitting: Nurse Practitioner

## 2018-04-22 NOTE — Telephone Encounter (Signed)
pts wife called and stated that the insurance has changed and the pts medication will need a PA before they are able to fill it. They use [facility] on [address].

## 2018-04-22 NOTE — Telephone Encounter (Signed)
PA submitted.

## 2018-04-27 ENCOUNTER — Telehealth: Payer: Self-pay | Admitting: Nurse Practitioner

## 2018-04-27 NOTE — Telephone Encounter (Signed)
Spoke to patient's wife about PA, states insurance has changed and he now has [place] and she called and gave that information to someone, can't remember name.  I don't see the information reflected.  Will have Dena check covermymeds to see if she sent this through with the correct information.

## 2018-04-27 NOTE — Telephone Encounter (Signed)
Mrs. Richens informed that Tramadol approved 04/22/28-05/21/18.

## 2018-04-27 NOTE — Telephone Encounter (Signed)
Pts wife called and stated that he still hasn't gotten his medication approved and needs a PA. She said that they can only fill 7 pills without the PA. I looked back in the notes and saw that a PA was submitted on 1/2 but wife is stating pharmacy didn't get it. [facility] [address] [facility]. Pts wife requests a call back as well as a new PA.

## 2018-04-30 ENCOUNTER — Other Ambulatory Visit: Payer: Self-pay | Admitting: Internal Medicine

## 2018-04-30 DIAGNOSIS — K50111 Crohn's disease of large intestine with rectal bleeding: Secondary | ICD-10-CM

## 2018-05-12 ENCOUNTER — Encounter: Payer: 59 | Admitting: Nurse Practitioner

## 2018-05-18 ENCOUNTER — Ambulatory Visit: Payer: 59

## 2018-05-27 ENCOUNTER — Other Ambulatory Visit: Payer: Self-pay

## 2018-05-27 ENCOUNTER — Encounter: Payer: Self-pay | Admitting: Nurse Practitioner

## 2018-05-27 ENCOUNTER — Ambulatory Visit: Payer: BLUE CROSS/BLUE SHIELD | Attending: Nurse Practitioner | Admitting: Nurse Practitioner

## 2018-05-27 VITALS — BP 118/89 | HR 67 | Temp 98.1°F | Ht 70.0 in | Wt 200.0 lb

## 2018-05-27 DIAGNOSIS — M542 Cervicalgia: Secondary | ICD-10-CM | POA: Diagnosis not present

## 2018-05-27 DIAGNOSIS — G894 Chronic pain syndrome: Secondary | ICD-10-CM | POA: Diagnosis present

## 2018-05-27 DIAGNOSIS — M170 Bilateral primary osteoarthritis of knee: Secondary | ICD-10-CM | POA: Insufficient documentation

## 2018-05-27 DIAGNOSIS — G8929 Other chronic pain: Secondary | ICD-10-CM | POA: Diagnosis present

## 2018-05-27 DIAGNOSIS — M545 Low back pain, unspecified: Secondary | ICD-10-CM

## 2018-05-27 MED ORDER — TRAMADOL HCL 50 MG PO TABS: 50.0000 mg | ORAL_TABLET | Freq: Two times a day (BID) | ORAL | 5 refills | Status: DC

## 2018-05-27 NOTE — Patient Instructions (Signed)
____________________________________________________________________________________________  Medication Rules  Purpose: To inform patients, and their family members, of our rules and regulations.  Applies to: All patients receiving prescriptions (written or electronic).  Pharmacy of record: Pharmacy where electronic prescriptions will be sent. If written prescriptions are taken to a different pharmacy, please inform the nursing staff. The pharmacy listed in the electronic medical record should be the one where you would like electronic prescriptions to be sent.  Electronic prescriptions: In compliance with the [place] Strengthen Opioid Misuse Prevention (STOP) Act of 2017 (Session Law 2017-74/H243), effective April 21, 2018, all controlled substances must be electronically prescribed. Calling prescriptions to the pharmacy will cease to exist.  Prescription refills: Only during scheduled appointments. Applies to all prescriptions.  NOTE: The following applies primarily to controlled substances (Opioid* Pain Medications).   Patient's responsibilities: 1. Pain Pills: Bring all pain pills to every appointment (except for procedure appointments). 2. Pill Bottles: Bring pills in original pharmacy bottle. Always bring the newest bottle. Bring bottle, even if empty. 3. Medication refills: You are responsible for knowing and keeping track of what medications you take and those you need refilled. The day before your appointment: write a list of all prescriptions that need to be refilled. The day of the appointment: give the list to the admitting nurse. Prescriptions will be written only during appointments. No prescriptions will be written on procedure days. If you forget a medication: it will not be "Called in", "Faxed", or "electronically sent". You will need to get another appointment to get these prescribed. No early refills. Do not call asking to have your prescription filled  early. 4. Prescription Accuracy: You are responsible for carefully inspecting your prescriptions before leaving our office. Have the discharge nurse carefully go over each prescription with you, before taking them home. Make sure that your name is accurately spelled, that your address is correct. Check the name and dose of your medication to make sure it is accurate. Check the number of pills, and the written instructions to make sure they are clear and accurate. Make sure that you are given enough medication to last until your next medication refill appointment. 5. Taking Medication: Take medication as prescribed. When it comes to controlled substances, taking less pills or less frequently than prescribed is permitted and encouraged. Never take more pills than instructed. Never take medication more frequently than prescribed.  6. Inform other Doctors: Always inform, all of your healthcare providers, of all the medications you take. 7. Pain Medication from other Providers: You are not allowed to accept any additional pain medication from any other Doctor or Healthcare provider. There are two exceptions to this rule. (see below) In the event that you require additional pain medication, you are responsible for notifying us, as stated below. 8. Medication Agreement: You are responsible for carefully reading and following our Medication Agreement. This must be signed before receiving any prescriptions from our practice. Safely store a copy of your signed Agreement. Violations to the Agreement will result in no further prescriptions. (Additional copies of our Medication Agreement are available upon request.) 9. Laws, Rules, & Regulations: All patients are expected to follow all Federal and State Laws, Statutes, Rules, & Regulations. Ignorance of the Laws does not constitute a valid excuse. The use of any illegal substances is prohibited. 10. Adopted CDC guidelines & recommendations: Target dosing levels will be  at or below 60 MME/day. Use of benzodiazepines** is not recommended.  Exceptions: There are only two exceptions to the rule of not   receiving pain medications from other Healthcare Providers. 1. Exception #1 (Emergencies): In the event of an emergency (i.e.: accident requiring emergency care), you are allowed to receive additional pain medication. However, you are responsible for: As soon as you are able, call our office [phone], at any time of the day or night, and leave a message stating your name, the date and nature of the emergency, and the name and dose of the medication prescribed. In the event that your call is answered by a member of our staff, make sure to document and save the date, time, and the name of the person that took your information.  2. Exception #2 (Planned Surgery): In the event that you are scheduled by another doctor or dentist to have any type of surgery or procedure, you are allowed (for a period no longer than 30 days), to receive additional pain medication, for the acute post-op pain. However, in this case, you are responsible for picking up a copy of our "Post-op Pain Management for Surgeons" handout, and giving it to your surgeon or dentist. This document is available at our office, and does not require an appointment to obtain it. Simply go to our office during business hours (Monday-Thursday from 8:00 AM to 4:00 PM) (Friday 8:00 AM to 12:00 Noon) or if you have a scheduled appointment with us, prior to your surgery, and ask for it by name. In addition, you will need to provide us with your name, name of your surgeon, type of surgery, and date of procedure or surgery.  *Opioid medications include: morphine, codeine, oxycodone, oxymorphone, hydrocodone, hydromorphone, meperidine, tramadol, tapentadol, buprenorphine, fentanyl, methadone. **Benzodiazepine medications include: diazepam (Valium), alprazolam (Xanax), clonazepam (Klonopine), lorazepam (Ativan), clorazepate  (Tranxene), chlordiazepoxide (Librium), estazolam (Prosom), oxazepam (Serax), temazepam (Restoril), triazolam (Halcion) (Last updated: 06/18/2017) ____________________________________________________________________________________________    

## 2018-05-27 NOTE — Progress Notes (Signed)
Patient's Name: Samuel Roberts  MRN: [id number]  Referring Provider: [name], MD  DOB: July 12, 1955  PCP: [name], MD  DOS: 05/27/2018  Note by: Vevelyn Francois NP  Service setting: Ambulatory outpatient  Specialty: Interventional Pain Management  Location: [facility] (AMB) Pain Management Facility    Patient type: Established    Primary Reason(s) for Visit: Encounter for prescription drug management. (Level of risk: moderate)  CC: Back Pain  HPI  Samuel Roberts is a 63 y.o. year old, male patient, who comes today for a medication management evaluation. He has Angina pectoris ([place]); Crohn's colitis, with rectal bleeding ([place]); BPH (benign prostatic hyperplasia); Carcinoma in situ of prostate; Elevated prostate specific antigen (PSA); Family history of malignant neoplasm of prostate; Arthropathy of knee (Right); Enlarged prostate with lower urinary tract symptoms (LUTS); Chronic neck pain  (Primary Area of Pain) (Bilateral) (L>R); Occipital neuralgia (Tertiary Area of Pain) (Left); Chronic shoulder pain (Secondary Area of Pain) (Left); Chronic low back pain (Fourth Area of Pain) (Right) without sciatica; Chronic knee pain (Fifth Area of Pain) (Bilateral) (R>L); Chronic pain syndrome; Long term current use of opiate analgesic; Pharmacologic therapy; Disorder of skeletal system; Problems influencing health status; Pain of left humerus; Chronic upper extremity pain (Left); DDD (degenerative disc disease), cervical; Cervical spondylosis; 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 His primarily concern today is the Back Pain  Pain Assessment: Location: Lower,  Right Back Radiating: denies Onset: More than a month ago Duration: Chronic pain Quality: Aching Severity: 5 /10 (subjective, self-reported pain score)  Note: Reported level is compatible with observation. Clinically the patient looks like a 1/10 A 1/10 is viewed as "Mild" and described as nagging, annoying, but not interfering with basic activities of daily living (ADL). Samuel Roberts is able to eat, bathe, get dressed, do toileting (being able to get on and off the toilet and perform personal hygiene functions), transfer (move in and out of bed or a chair without assistance), and maintain continence (able to control bladder and bowel functions). Physiologic parameters such as blood pressure and heart rate apear wnl.        Effect on ADL: limits my daily Timing: Constant Modifying factors: medications, heating , ice BP: 118/89  HR: 67  Samuel Roberts was last scheduled for an appointment on 04/27/2018 for medication management. During today's appointment we reviewed [name]'s chronic pain status, as well as his outpatient medication regimen. He is SP right TKR at [facility].  He has completed physical therapy.  He continues to do his daily exercises.  He is doing well and is now working a few days per week. His follow up is next week.  He denies any new concerns today related to his back pain.  He denies any side effects of his medication..   The patient  reports no history of drug use. His body mass index is 28.7 kg/m.  Further details on both, my assessment(s), as well as the proposed treatment plan, please see below.  Controlled Substance Pharmacotherapy Assessment REMS (Risk Evaluation and Mitigation Strategy)  Analgesic:Tramadol 50 twice daily tramadol 100 mg per day MME/day:146m/day.   BChauncey Fischer RN  05/27/2018  8:44 AM  Sign when Signing Visit Nursing Pain Medication Assessment:  Safety precautions to  be maintained throughout the outpatient stay will include: orient to surroundings,  keep bed in low position, maintain call bell within reach at all times, provide assistance with transfer out of bed and ambulation.  Medication Inspection Compliance: Pill count conducted under aseptic conditions, in front of the patient. Neither the pills nor the bottle was removed from the patient's sight at any time. Once count was completed pills were immediately returned to the patient in their original bottle.  Medication: Tramadol (Ultram) Pill/Patch Count: 0 of 60 pills remain Pill/Patch Appearance: Markings consistent with prescribed medication Bottle Appearance: Standard pharmacy container. Clearly labeled. Filled Date: 1 / 5 / 2020 Last Medication intake:  Today   Pharmacokinetics: Liberation and absorption (onset of action): WNL Distribution (time to peak effect): WNL Metabolism and excretion (duration of action): WNL         Pharmacodynamics: Desired effects: Analgesia: Mr. Comes reports >50% benefit. Functional ability: Patient reports that medication allows him to accomplish basic ADLs Clinically meaningful improvement in function (CMIF): Sustained CMIF goals met Perceived effectiveness: Described as relatively effective, allowing for increase in activities of daily living (ADL) Undesirable effects: Side-effects or Adverse reactions: None reported Monitoring: [place] PMP: Online review of the past 40-monthperiod conducted. Compliant with practice rules and regulations Last UDS on record: Summary  Date Value Ref Range Status  02/11/2018 FINAL  Final    Comment:    ==================================================================== TOXASSURE SELECT 13 (MW) ==================================================================== Test                             Result       Flag       Units Drug Present and Declared for Prescription Verification   Tramadol                       >10000       EXPECTED   ng/mg creat   O-Desmethyltramadol            >10000       EXPECTED   ng/mg  creat   N-Desmethyltramadol            1584         EXPECTED   ng/mg creat    Source of tramadol is a prescription medication.    O-desmethyltramadol and N-desmethyltramadol are expected    metabolites of tramadol. ==================================================================== Test                      Result    Flag   Units      Ref Range   Creatinine              50               mg/dL      >=20 ==================================================================== Declared Medications:  The flagging and interpretation on this report are based on the  following declared medications.  Unexpected results may arise from  inaccuracies in the declared medications.  **Note: The testing scope of this panel includes these medications:  Tramadol  **Note: The testing scope of this panel does not include following  reported medications:  Mesalamine  Pantoprazole ==================================================================== For clinical consultation, please call [phone] ====================================================================    UDS interpretation: Compliant          Medication Assessment Form: Reviewed. Patient indicates being compliant with therapy Treatment compliance: Compliant Risk Assessment Profile: Aberrant behavior: See initial evaluations. None observed  or detected today Comorbid factors increasing risk of overdose: See initial evaluation. No additional risks detected today Opioid risk tool (ORT):  Opioid Risk  05/27/2018  Alcohol 0  Illegal Drugs 0  Rx Drugs 0  Alcohol 0  Illegal Drugs 0  Rx Drugs 0  Age between 16-45 years  0  History of Preadolescent Sexual Abuse 0  Psychological Disease 0  Depression 0  Opioid Risk Tool Scoring 0  Opioid Risk Interpretation Low Risk    ORT Scoring interpretation table:  Score <3 = Low Risk for SUD  Score between 4-7 = Moderate Risk for SUD  Score >8 = High Risk for Opioid Abuse   Risk of substance  use disorder (SUD): Low  Risk Mitigation Strategies:  Patient Counseling: Covered Patient-Prescriber Agreement (PPA): Present and active  Notification to other healthcare providers: Done  Pharmacologic Plan: No change in therapy, at this time.             Laboratory Chemistry  Inflammation Markers (CRP: Acute Phase) (ESR: Chronic Phase) Lab Results  Component Value Date   CRP 2.8 06/24/2017   ESRSEDRATE 11 06/24/2017                         Rheumatology Markers No results found for: RF, ANA, LABURIC, URICUR, LYMEIGGIGMAB, LYMEABIGMQN, HLAB27                      Renal Function Markers Lab Results  Component Value Date   BUN 17 09/06/2016   CREATININE 0.73 09/06/2016   GFRAA >60 09/06/2016   GFRNONAA >60 09/06/2016                             Hepatic Function Markers Lab Results  Component Value Date   AST 27 09/05/2016   ALT 15 (L) 09/05/2016   ALBUMIN 4.1 09/05/2016   ALKPHOS 62 09/05/2016                        Electrolytes Lab Results  Component Value Date   NA 138 09/06/2016   K 5.1 09/06/2016   CL 103 09/06/2016   CALCIUM 9.3 09/06/2016   MG 2.4 (H) 06/24/2017                        Neuropathy Markers No results found for: VITAMINB12, FOLATE, HGBA1C, HIV                      CNS Tests No results found for: COLORCSF, APPEARCSF, RBCCOUNTCSF, WBCCSF, POLYSCSF, LYMPHSCSF, EOSCSF, PROTEINCSF, GLUCCSF, JCVIRUS, CSFOLI, IGGCSF                      Bone Pathology Markers Lab Results  Component Value Date   25OHVITD1 37 06/24/2017   25OHVITD2 2.2 06/24/2017   25OHVITD3 35 06/24/2017                         Coagulation Parameters Lab Results  Component Value Date   PLT 306 09/06/2016                        Cardiovascular Markers Lab Results  Component Value Date   TROPONINI <0.03 09/06/2016   HGB 14.1 09/06/2016   HCT 40.7 09/06/2016  CA Markers No results found for: CEA, CA125, LABCA2                      Endocrine  Markers Lab Results  Component Value Date   TSH 1.88 05/10/2014                        Note: Lab results reviewed.  Recent Diagnostic Imaging Results  DG C-Arm 1-60 Min-No Report Fluoroscopy was utilized by the requesting physician.  No radiographic  interpretation.   Complexity Note: Imaging results reviewed. Results shared with Mr. Resh, using Layman's terms.                         Meds   Current Outpatient Medications:  .  mesalamine (LIALDA) 1.2 g EC tablet, Take 1.2 g by mouth 2 (two) times daily., Disp: , Rfl:  .  pantoprazole (PROTONIX) 40 MG tablet, Take 40 mg by mouth every other day. , Disp: , Rfl: 0 .  traMADol (ULTRAM) 50 MG tablet, Take 1 tablet (50 mg total) by mouth 2 (two) times daily., Disp: 60 tablet, Rfl: 5  ROS  Constitutional: Denies any fever or chills Gastrointestinal: No reported hemesis, hematochezia, vomiting, or acute GI distress Musculoskeletal: Denies any acute onset joint swelling, redness, loss of ROM, or weakness Neurological: No reported episodes of acute onset apraxia, aphasia, dysarthria, agnosia, amnesia, paralysis, loss of coordination, or loss of consciousness  Allergies  Mr. Trombly has No Known Allergies.  [place]  Drug: Mr. Ferrone  reports no history of drug use. Alcohol:  reports no history of alcohol use. Tobacco:  reports that he has never smoked. He has never used smokeless tobacco. Medical:  has a past medical history of BPH (benign prostatic hyperplasia) and Colitis, ulcerative ([place]). Surgical: Mr. Bjorkman  has a past surgical history that includes Knee arthroscopy and arthrotomy; Tonsillectomy; LEFT HEART CATH AND CORONARY ANGIOGRAPHY (Left, 09/09/2016); and Joint replacement (Right, 02/17/2018). Family: family history includes Cancer in his father; Cirrhosis in his brother; Diabetes in his mother, sister, and sister; Drug abuse in his brother and brother; Heart disease in his brother and brother; Heart failure in his father and  mother.  Constitutional Exam  General appearance: Well nourished, well developed, and well hydrated. In no apparent acute distress Vitals:   05/27/18 0844  BP: 118/89  Pulse: 67  Temp: 98.1 F (36.7 C)  SpO2: 97%  Weight: 200 lb (90.7 kg)  Height: 5' 10" (1.778 m)  Psych/Mental status: Alert, oriented x 3 (person, place, & time)       Eyes: PERLA Respiratory: No evidence of acute respiratory distress  Lumbar Spine Area Exam  Skin & Axial Inspection: No masses, redness, or swelling Alignment: Symmetrical Functional ROM: Unrestricted ROM       Stability: No instability detected Muscle Tone/Strength: Functionally intact. No obvious neuro-muscular anomalies detected. Sensory (Neurological): Unimpaired Palpation: Tender       Provocative Tests: Hyperextension/rotation test: (+) bilaterally for facet joint pain. Lumbar quadrant test (Kemp's test): deferred today       Lateral bending test: deferred today       Patrick's Maneuver: deferred today                    Gait & Posture Assessment  Ambulation: Unassisted Gait: Relatively normal for age and body habitus Posture: WNL   Lower Extremity Exam    Side: Right lower extremity  Side: Left lower extremity  Stability: No instability observed          Stability: No instability observed          Skin & Extremity Inspection: Evidence of prior arthroplastic surgery  Skin & Extremity Inspection: Skin color, temperature, and hair growth are WNL. No peripheral edema or cyanosis. No masses, redness, swelling, asymmetry, or associated skin lesions. No contractures.  Functional ROM: Adequate ROM                  Functional ROM: Unrestricted ROM                  Muscle Tone/Strength: Functionally intact. No obvious neuro-muscular anomalies detected.  Muscle Tone/Strength: Functionally intact. No obvious neuro-muscular anomalies detected.  Sensory (Neurological): Unimpaired        Sensory (Neurological): Unimpaired            Palpation: No  palpable anomalies  Palpation: No palpable anomalies   Assessment  Primary Diagnosis & Pertinent Problem List: The primary encounter diagnosis was Chronic low back pain (Fourth Area of Pain) (Right) without sciatica. Diagnoses of Osteoarthritis of knees (Bilateral), Cervicalgia, and Chronic pain syndrome were also pertinent to this visit.  Status Diagnosis  Controlled Controlled Controlled 1. Chronic low back pain (Fourth Area of Pain) (Right) without sciatica   2. Osteoarthritis of knees (Bilateral)   3. Cervicalgia   4. Chronic pain syndrome     Problems updated and reviewed during this visit: Problem  Generalized Muscle Weakness  History of Dvt (Deep Vein Thrombosis)  Ibd (Inflammatory Bowel Disease)  Status Post Total Right Knee Replacement  Cervical Spondylosis   Disc degeneration and spondylosis C3-4 and C4-5 with mild foraminal narrowing bilaterally.  Disc degeneration and spondylosis C3-4 and C4-5 with mild foraminal narrowing bilaterally.   Tricompartment osteoarthritis of knee (Right)  Bph (Benign Prostatic Hyperplasia)   Plan of Care  Pharmacotherapy (Medications Ordered): Meds ordered this encounter  Medications  . traMADol (ULTRAM) 50 MG tablet    Sig: Take 1 tablet (50 mg total) by mouth 2 (two) times daily.    Dispense:  60 tablet    Refill:  5    Do not add this medication to the electronic "Automatic Refill" notification system. Patient may have prescription filled one day early if pharmacy is closed on scheduled refill date.    Order Specific Question:   Supervising Provider    Answer:   Milinda Pointer [[id number]]   New Prescriptions   No medications on file   Medications administered today: Oluwadamilare Tobler. Chery had no medications administered during this visit. Lab-work, procedure(s), and/or referral(s): No orders of the defined types were placed in this encounter.  Imaging and/or referral(s): None  Interventional management options: Planned,  scheduled, and/or pending:   None at this time   Considering:   Diagnostic left-sided cervical facet nerve block Possible left-sided cervical facet RFA Diagnosticleft suprascapular nerve block Diagnostictrigger point injection Diagnosticright lumbar facet nerve block Diagnostic right lumbar facet RFA Diagnostic Left knee Hyalgan series Diagnostic Right knee genicular nerve block Possible right knee genicular RFA   Palliative PRN treatment(s):   Diagnostic left-sided cervical epidural steroid injection #3 (PRN)     Provider-requested follow-up: Return in about 6 months (around 11/25/2018) for MedMgmt.  Future Appointments  Date Time Provider [place]  06/03/2018  1:00 PM [facility]  11/22/2018  8:00 AM Vevelyn Francois, NP [facility] None   Primary Care Physician: [name], MD Location: [facility]  Outpatient Pain Management Facility Note by: Vevelyn Francois NP Date: 05/27/2018; Time: 10:25 AM  Pain Score Disclaimer: We use the NRS-11 scale. This is a self-reported, subjective measurement of pain severity with only modest accuracy. It is used primarily to identify changes within a particular patient. It must be understood that outpatient pain scales are significantly less accurate that those used for research, where they can be applied under ideal controlled circumstances with minimal exposure to variables. In reality, the score is likely to be a combination of pain intensity and pain affect, where pain affect describes the degree of emotional arousal or changes in action readiness caused by the sensory experience of pain. Factors such as social and work situation, setting, emotional state, anxiety levels, expectation, and prior pain experience may influence pain perception and show large inter-individual differences that may also be affected by time variables.  Patient instructions provided during this appointment: Patient Instructions   ____________________________________________________________________________________________  Medication Rules  Purpose: To inform patients, and their family members, of our rules and regulations.  Applies to: All patients receiving prescriptions (written or electronic).  Pharmacy of record: Pharmacy where electronic prescriptions will be sent. If written prescriptions are taken to a different pharmacy, please inform the nursing staff. The pharmacy listed in the electronic medical record should be the one where you would like electronic prescriptions to be sent.  Electronic prescriptions: In compliance with the [place] (STOP) Act of 2017 (Session Lanny Cramp [phone]), effective April 21, 2018, all controlled substances must be electronically prescribed. Calling prescriptions to the pharmacy will cease to exist.  Prescription refills: Only during scheduled appointments. Applies to all prescriptions.  NOTE: The following applies primarily to controlled substances (Opioid* Pain Medications).   Patient's responsibilities: 1. Pain Pills: Bring all pain pills to every appointment (except for procedure appointments). 2. Pill Bottles: Bring pills in original pharmacy bottle. Always bring the newest bottle. Bring bottle, even if empty. 3. Medication refills: You are responsible for knowing and keeping track of what medications you take and those you need refilled. The day before your appointment: write a list of all prescriptions that need to be refilled. The day of the appointment: give the list to the admitting nurse. Prescriptions will be written only during appointments. No prescriptions will be written on procedure days. If you forget a medication: it will not be "Called in", "Faxed", or "electronically sent". You will need to get another appointment to get these prescribed. No early refills. Do not call asking to have your prescription filled  early. 4. Prescription Accuracy: You are responsible for carefully inspecting your prescriptions before leaving our office. Have the discharge nurse carefully go over each prescription with you, before taking them home. Make sure that your name is accurately spelled, that your address is correct. Check the name and dose of your medication to make sure it is accurate. Check the number of pills, and the written instructions to make sure they are clear and accurate. Make sure that you are given enough medication to last until your next medication refill appointment. 5. Taking Medication: Take medication as prescribed. When it comes to controlled substances, taking less pills or less frequently than prescribed is permitted and encouraged. Never take more pills than instructed. Never take medication more frequently than prescribed.  6. Inform other Doctors: Always inform, all of your healthcare providers, of all the medications you take. 7. Pain Medication from other Providers: You are not allowed to accept any additional pain  medication from any other Doctor or Healthcare provider. There are two exceptions to this rule. (see below) In the event that you require additional pain medication, you are responsible for notifying us, as stated below. 8. Medication Agreement: You are responsible for carefully reading and following our Medication Agreement. This must be signed before receiving any prescriptions from our practice. Safely store a copy of your signed Agreement. Violations to the Agreement will result in no further prescriptions. (Additional copies of our Medication Agreement are available upon request.) 9. Laws, Rules, & Regulations: All patients are expected to follow all Federal and [employer], [employer], Rules, [employer]. Ignorance of the Laws does not constitute a valid excuse. The use of any illegal substances is prohibited. 10. Adopted CDC guidelines & recommendations: Target dosing levels will be  at or below 60 MME/day. Use of benzodiazepines** is not recommended.  Exceptions: There are only two exceptions to the rule of not receiving pain medications from other Healthcare Providers. 1. Exception #1 (Emergencies): In the event of an emergency (i.e.: accident requiring emergency care), you are allowed to receive additional pain medication. However, you are responsible for: As soon as you are able, call our office [phone], at any time of the day or night, and leave a message stating your name, the date and nature of the emergency, and the name and dose of the medication prescribed. In the event that your call is answered by a member of our staff, make sure to document and save the date, time, and the name of the person that took your information.  2. Exception #2 (Planned Surgery): In the event that you are scheduled by another doctor or dentist to have any type of surgery or procedure, you are allowed (for a period no longer than 30 days), to receive additional pain medication, for the acute post-op pain. However, in this case, you are responsible for picking up a copy of our "Post-op Pain Management for Surgeons" handout, and giving it to your surgeon or dentist. This document is available at our office, and does not require an appointment to obtain it. Simply go to our office during business hours (Monday-Thursday from 8:00 AM to 4:00 PM) (Friday 8:00 AM to 12:00 Noon) or if you have a scheduled appointment with [place], prior to your surgery, and ask for it by name. In addition, you will need to provide [place] with your name, name of your surgeon, type of surgery, and date of procedure or surgery.  *Opioid medications include: morphine, codeine, oxycodone, oxymorphone, hydrocodone, hydromorphone, meperidine, tramadol, tapentadol, buprenorphine, fentanyl, methadone. **Benzodiazepine medications include: diazepam (Valium), alprazolam (Xanax), clonazepam (Klonopine), lorazepam (Ativan), clorazepate  (Tranxene), chlordiazepoxide (Librium), estazolam (Prosom), oxazepam (Serax), temazepam (Restoril), triazolam (Halcion) (Last updated: 06/18/2017) ____________________________________________________________________________________________

## 2018-05-27 NOTE — Progress Notes (Signed)
Nursing Pain Medication Assessment:  Safety precautions to be maintained throughout the outpatient stay will include: orient to surroundings, keep bed in low position, maintain call bell within reach at all times, provide assistance with transfer out of bed and ambulation.  Medication Inspection Compliance: Pill count conducted under aseptic conditions, in front of the patient. Neither the pills nor the bottle was removed from the patient's sight at any time. Once count was completed pills were immediately returned to the patient in their original bottle.  Medication: Tramadol (Ultram) Pill/Patch Count: 0 of 60 pills remain Pill/Patch Appearance: Markings consistent with prescribed medication Bottle Appearance: Standard pharmacy container. Clearly labeled. Filled Date: 1 / 5 / 2020 Last Medication intake:  Today

## 2018-05-28 ENCOUNTER — Telehealth: Payer: Self-pay

## 2018-05-28 NOTE — Telephone Encounter (Signed)
Post procedure phone call.

## 2018-06-03 ENCOUNTER — Ambulatory Visit: Admission: RE | Admit: 2018-06-03 | Payer: BLUE CROSS/BLUE SHIELD | Source: Ambulatory Visit

## 2018-06-16 ENCOUNTER — Ambulatory Visit
Admission: RE | Admit: 2018-06-16 | Discharge: 2018-06-16 | Disposition: A | Discharge status: Home / Self Care | Payer: BLUE CROSS/BLUE SHIELD | Source: Ambulatory Visit | Attending: Internal Medicine | Admitting: Internal Medicine

## 2018-06-16 DIAGNOSIS — K50111 Crohn's disease of large intestine with rectal bleeding: Secondary | ICD-10-CM | POA: Diagnosis present

## 2018-06-16 LAB — POCT I-STAT CREATININE: Creatinine, Ser: 0.8 mg/dL (ref 0.61–1.24)

## 2018-06-16 MED ORDER — IOHEXOL 300 MG/ML  SOLN
100.0000 mL | Freq: Once | INTRAMUSCULAR | Status: AC | PRN
  Administered 2018-06-16: 100 mL via INTRAVENOUS

## 2018-08-23 DIAGNOSIS — R1031 Right lower quadrant pain: Secondary | ICD-10-CM | POA: Insufficient documentation

## 2018-08-23 DIAGNOSIS — R1032 Left lower quadrant pain: Secondary | ICD-10-CM | POA: Insufficient documentation

## 2018-08-23 DIAGNOSIS — R509 Fever, unspecified: Secondary | ICD-10-CM | POA: Insufficient documentation

## 2018-11-21 NOTE — Progress Notes (Signed)
Pain Management Virtual Encounter Note - Virtual Visit via Telephone Telehealth (real-time audio visits between healthcare provider and patient).   Patient's Phone No. & Preferred Pharmacy:  [phone] (home); [phone] (mobile); (Preferred) [phone] No e-mail address on record  [id number] [address] [id number]: [phone]: [phone]   Pre-screening note:  Our staff contacted [name] and offered him an "in person", "face-to-face" appointment versus a telephone encounter. He indicated preferring the telephone encounter, at this time.   Reason for Virtual Visit: COVID-19*  Social distancing based on CDC and AMA recommendations.   I contacted Samuel Roberts 11/22/2018 via telephone.      I clearly identified myself as FGaspar Cola MD. I verified that I was speaking with the correct person using two identifiers (Name: Samuel Roberts and date of birth: 8Sep 02, 1957.  Advanced Informed Consent I sought verbal advanced consent from Samuel Roberts virtual visit interactions. I informed Samuel Roberts of possible security and privacy concerns, risks, and limitations associated with providing "not-in-person" medical evaluation and management services. I also informed Samuel Roberts of the availability of "in-person" appointments. Finally, I informed him that there would be a charge for the virtual visit and that he could be  personally, fully or partially, financially responsible for it. Mr. NGrimaldoexpressed understanding and agreed to proceed.   Historic Elements   Mr. Samuel NICOLLSis a 63y.o. year old, male patient evaluated today after his last encounter by our practice on 05/28/2018. Samuel Roberts has a past medical history of BPH (benign prostatic hyperplasia) and Colitis, ulcerative ([place]. He also  has a past surgical history that includes Knee arthroscopy and arthrotomy; Tonsillectomy; LEFT HEART CATH  AND CORONARY ANGIOGRAPHY (Left, 09/09/2016); and Joint replacement (Right, 02/17/2018). Mr. NSchochhas a current medication list which includes the following prescription(s): mesalamine, pantoprazole, and tramadol. He  reports that he has never smoked. He has never used smokeless tobacco. He reports that he does not drink alcohol or use drugs. Mr. NDumlaohas No Known Allergies.   HPI  Today, he is being contacted for medication management.  The patient indicates not having any type of problems with the medication.  However, he recently had a right knee replacement and he refers that this has worked the best in controlling his knee pain.  The only problem that he has been having is that he has been experiencing a little bit of swelling in the area of the knee and therefore I have recommended some over-the-counter Arnica cream.  In addition, he complained about some night cramps for which I have recommended taking a multivitamin every day, a glass of Gatorade with each meal, magnesium 500 mg p.o. at bedtime, and if still giving him problems, he can also use some tonic water (approximately 8 ounces) close to bedtime.  He refers that most of the cramps that he gets them that leg are usually at bedtime.  Pharmacotherapy Assessment  Analgesic: Tramadol 50 mg, 1 tab PO BID (100 mg/day of tramadol) MME/day:170mday.   Monitoring: Pharmacotherapy: No side-effects or adverse reactions reported. [place] PMP: PDMP reviewed during this encounter.       Compliance: No problems identified. Effectiveness: Clinically acceptable. Plan: Refer to "POC".  UDS:  Summary  Date Value Ref Range Status  02/11/2018 FINAL  Final    Comment:    ==================================================================== TOXASSURE SELECT 13 (MW) ==================================================================== Test  Result       Flag       Units Drug Present and Declared for Prescription Verification    Tramadol                       >10000       EXPECTED   ng/mg creat   O-Desmethyltramadol            >10000       EXPECTED   ng/mg creat   Samuel Roberts            1584         EXPECTED   ng/mg creat    Source of tramadol is a prescription medication.    O-desmethyltramadol and Samuel Roberts are expected    metabolites of tramadol. ==================================================================== Test                      Result    Flag   Units      Ref Range   Creatinine              50               mg/dL      >=20 ==================================================================== Declared Medications:  The flagging and interpretation on this report are based on the  following declared medications.  Unexpected results may arise from  inaccuracies in the declared medications.  **Note: The testing scope of this panel includes these medications:  Tramadol  **Note: The testing scope of this panel does not include following  reported medications:  Mesalamine  Pantoprazole ==================================================================== For clinical consultation, please call [phone]. ====================================================================    Laboratory Chemistry Profile (12 mo)  Renal: 06/16/2018: Creatinine, Ser 0.80  Hepatic: No results found for requested labs within last 8760 hours. Other: No results found for requested labs within last 8760 hours. Note: Above Lab results reviewed.  Imaging  Last 90 days:  No results found. Last Hospital Admission:  Ct Entero Abd/pelvis W Contast  Result Date: 06/16/2018 CLINICAL DATA:  Evaluate for Crohn disease. EXAM: CT ABDOMEN AND PELVIS WITH CONTRAST (ENTEROGRAPHY) TECHNIQUE: Multidetector CT of the abdomen and pelvis during bolus administration of intravenous contrast. Negative oral contrast was given. CONTRAST:  154m OMNIPAQUE IOHEXOL 300 MG/ML  SOLN COMPARISON:  CT abdomen pelvis 07/18/2013  FINDINGS: Lower chest:  Unremarkable Hepatobiliary: No suspicious focal abnormality within the liver parenchyma. There is no evidence for gallstones, gallbladder wall thickening, or pericholecystic fluid. No intrahepatic or extrahepatic biliary dilation. Pancreas: No focal mass lesion. No dilatation of the main duct. No intraparenchymal cyst. No peripancreatic edema. Spleen: No splenomegaly. No focal mass lesion. Adrenals/Urinary Tract: No adrenal nodule or mass. Kidneys unremarkable. Tiny hypodensities in each kidney are stable in the long interval since prior study compatible with benign etiology such as cysts. No evidence for hydroureter. The urinary bladder appears normal for the degree of distention. Stomach/Bowel: Stomach is well distended without wall thickening or abnormal mural enhancement. Duodenum is normal. Duodenal diverticulum evident. No wall thickening or abnormal mural enhancement in the jejunum or ileum. No evidence for small bowel stricture. No abnormal inner wall or transmural hyperenhancement in the terminal ileum The appendix is normal. No gross colonic mass. No colonic wall thickening. Diverticular changes are noted in the left colon without evidence of diverticulitis. Vascular/Lymphatic: There is abdominal aortic atherosclerosis without aneurysm. There is no gastrohepatic or hepatoduodenal ligament lymphadenopathy. No intraperitoneal or retroperitoneal lymphadenopathy. No pelvic sidewall lymphadenopathy. Reproductive: Prostate gland  appears mildly enlarged. Other: No intraperitoneal free fluid. Musculoskeletal: Small left groin hernia contains only fat. No worrisome lytic or sclerotic osseous abnormality. IMPRESSION: 1. No evidence for bowel wall thickening or abnormal mucosal/transmural hyperenhancement. No inflammatory or fibro stenotic small bowel stricture. 2. Left colonic diverticulosis without diverticulitis. 3.  Aortic Atherosclerois (ICD10-170.0) Electronically Signed   By: Misty Stanley M.D.   On: 06/16/2018 17:23   Assessment  The primary encounter diagnosis was Chronic pain syndrome. Diagnoses of Chronic neck pain  (Primary Area of Pain) (Bilateral) (L>R), Chronic shoulder pain (Secondary Area of Pain) (Left), Occipital neuralgia (Tertiary Area of Pain) (Left), Chronic low back pain (Fourth Area of Pain) (Right) without sciatica, and Chronic knee pain (Fifth Area of Pain) (Bilateral) (R>L) were also pertinent to this visit.  Plan of Care  I am having Samuel Roberts. Pile maintain his pantoprazole, mesalamine, and traMADol.  Pharmacotherapy (Medications Ordered): Meds ordered this encounter  Medications  . traMADol (ULTRAM) 50 MG tablet    Sig: Take 1 tablet (50 mg total) by mouth 2 (two) times daily.    Dispense:  60 tablet    Refill:  5    Chronic Pain: STOP Act (Not applicable) Fill 1 day early if closed on refill date. Do not fill until: 11/23/2018. To last until: 05/22/2019. Avoid benzodiazepines within 8 hours of opioids   Orders:  No orders of the defined types were placed in this encounter.  Follow-up plan:   Return in about 6 months (around 05/18/2019) for (VV), E/M (MM) to assess OTC recommendations for leg spasms.      Interventional management options:  Considering:   Diagnostic left-sided cervical facet nerve block Possible left-sided cervical facet RFA Diagnosticleft suprascapular nerve block Diagnostictrigger point injection Diagnosticright lumbar facet nerve block Diagnostic right lumbar facet RFA Diagnostic Left knee Hyalgan series Diagnostic Right knee genicular nerve block Possible right knee genicular RFA   Palliative PRN treatment(s):   Diagnostic left-sided CESI #3 (PRN)    Recent Visits No visits were found meeting these conditions.  Showing recent visits within past 90 days and meeting all other requirements   Today's Visits Date Type Provider Dept  11/22/18 Office Visit Milinda Pointer, MD [facility]   Showing today's visits and meeting all other requirements   Future Appointments No visits were found meeting these conditions.  Showing future appointments within next 90 days and meeting all other requirements   I discussed the assessment and treatment plan with the patient. The patient was provided an opportunity to ask questions and all were answered. The patient agreed with the plan and demonstrated an understanding of the instructions.  Patient advised to call back or seek an in-person evaluation if the symptoms or condition worsens.  Total duration of non-face-to-face encounter: 18 minutes.  Note by: Gaspar Cola, MD Date: 11/22/2018; Time: 12:16 PM  Note: This dictation was prepared with Dragon dictation. Any transcriptional errors that may result from this process are unintentional.  Disclaimer:  * Given the special circumstances of the COVID-19 pandemic, the federal government has announced that the Office for Civil Rights (OCR) will exercise its enforcement discretion and will not impose penalties on physicians using telehealth in the event of noncompliance with regulatory requirements under the [place] (HIPAA) in connection with the good faith provision of telehealth during the PJASN-05 national public health emergency. ([place])

## 2018-11-22 ENCOUNTER — Other Ambulatory Visit: Payer: Self-pay

## 2018-11-22 ENCOUNTER — Encounter: Payer: BLUE CROSS/BLUE SHIELD | Admitting: Nurse Practitioner

## 2018-11-22 ENCOUNTER — Ambulatory Visit: Payer: BC Managed Care – PPO | Attending: Nurse Practitioner | Admitting: Pain Medicine

## 2018-11-22 DIAGNOSIS — M25562 Pain in left knee: Secondary | ICD-10-CM

## 2018-11-22 DIAGNOSIS — M545 Low back pain, unspecified: Secondary | ICD-10-CM

## 2018-11-22 DIAGNOSIS — G894 Chronic pain syndrome: Secondary | ICD-10-CM | POA: Diagnosis not present

## 2018-11-22 DIAGNOSIS — M5481 Occipital neuralgia: Secondary | ICD-10-CM | POA: Diagnosis not present

## 2018-11-22 DIAGNOSIS — M542 Cervicalgia: Secondary | ICD-10-CM | POA: Diagnosis not present

## 2018-11-22 DIAGNOSIS — M25561 Pain in right knee: Secondary | ICD-10-CM

## 2018-11-22 DIAGNOSIS — M25512 Pain in left shoulder: Secondary | ICD-10-CM

## 2018-11-22 DIAGNOSIS — G8929 Other chronic pain: Secondary | ICD-10-CM

## 2018-11-22 MED ORDER — TRAMADOL HCL 50 MG PO TABS: 50.0000 mg | ORAL_TABLET | Freq: Two times a day (BID) | ORAL | 5 refills | Status: DC

## 2018-11-22 NOTE — Patient Instructions (Signed)
____________________________________________________________________________________________  Muscle Spasms & Cramps  Cause:  The most common cause of muscle spasms and cramps is vitamin and/or electrolyte (calcium, potassium, sodium, etc.) deficiencies.  Possible triggers: Sweating - causes loss of electrolytes thru the skin. Steroids - causes loss of electrolytes thru the urine.  Treatment: 1. Gatorade (or any other electrolyte-replenishing drink) - Take 1, 8 oz glass with each meal (3 times a day). 2. OTC (over-the-counter) Magnesium 400 to 500 mg - Take 1 tablet twice a day (one with breakfast and one before bedtime). If you have kidney problems, talk to your primary care physician before taking any Magnesium. 3. Tonic Water with quinine - Take 1, 8 oz glass before bedtime.   ____________________________________________________________________________________________    

## 2019-05-03 NOTE — Progress Notes (Signed)
Patient: Samuel Roberts  Service Category: E/M  Provider: Gaspar Cola, MD  DOB: November 07, 1955  DOS: 05/04/2019  Location: Office  MRN: [id number]  Setting: Ambulatory outpatient  Referring Provider: [name], MD  Type: Established Patient  Specialty: Interventional Pain Management  PCP: [name], MD  Location: Remote location  Delivery: TeleHealth     Virtual Encounter - Pain Management PROVIDER NOTE: Information contained herein reflects review and annotations entered in association with encounter. Interpretation of such information and data should be left to medically-trained personnel. Information provided to patient can be located elsewhere in the medical record under "Patient Instructions". Document created using STT-dictation technology, any transcriptional errors that may result from process are unintentional.    Contact & Pharmacy Preferred: [phone] Home: [phone] (home) Mobile: [phone] (mobile) E-mail: [email or web address]  [id number] [place] [address] [place]Phone: [phone]: [phone]  Pre-screening  [name] offered "in-person" vs "virtual" encounter. He indicated preferring virtual for this encounter.   Reason COVID-19*  Social distancing based on CDC and AMA recommendations.   I contacted Samuel Pintoon 05/04/2019 via telephone.      I clearly identified myself as FGaspar Cola MD. I verified that I was speaking with the correct person using two identifiers (Name: Samuel Roberts and date of birth: [birth year].  Consent I sought verbal advanced consent from Samuel Pintofor virtual visit interactions. I informed [name] of possible security and privacy concerns, risks, and limitations associated with providing "not-in-person" medical evaluation and management services. I also informed [name] of the availability of "in-person" appointments. Finally, I informed  him that there would be a charge for the virtual visit and that he could be  personally, fully or partially, financially responsible for it. Mr. NHartogexpressed understanding and agreed to proceed.   Historic Elements   Mr. DPAMELA MADDYis a 64y.o. year old, male patient evaluated today after his last encounter by our practice on 11/22/2018. [name] has a past medical history of BPH (benign prostatic hyperplasia) and Colitis, ulcerative ([place]. He also  has a past surgical history that includes Knee arthroscopy and arthrotomy; Tonsillectomy; LEFT HEART CATH AND CORONARY ANGIOGRAPHY (Left, 09/09/2016); and Joint replacement (Right, 02/17/2018). Mr. NBartolettihas a current medication list which includes the following prescription(s): finasteride, mesalamine, [START ON 05/22/2019] tramadol, and pantoprazole. He  reports that he has never smoked. He has never used smokeless tobacco. He reports that he does not drink alcohol or use drugs. Mr. NChaithas No Known Allergies.   HPI  Today, he is being contacted for medication management.  Pharmacotherapy Assessment  Analgesic: Tramadol 50 mg, 1 tab PO BID (100 mg/day of tramadol) MME/day:149mday.   Monitoring: Pharmacotherapy: No side-effects or adverse reactions reported. [place] PMP: PDMP reviewed during this encounter.       Compliance: No problems identified. Effectiveness: Clinically acceptable. Plan: Refer to "POC".  UDS:  Summary  Date Value Ref Range Status  02/11/2018 FINAL  Final    Comment:    ==================================================================== TOXASSURE SELECT 13 (MW) ==================================================================== Test                             Result       Flag       Units Drug Present and Declared for Prescription Verification   Tramadol                       >  10000       EXPECTED   ng/mg creat   O-Desmethyltramadol            >10000       EXPECTED   ng/mg creat   N-Desmethyltramadol             1584         EXPECTED   ng/mg creat    Source of tramadol is a prescription medication.    O-desmethyltramadol and N-desmethyltramadol are expected    metabolites of tramadol. ==================================================================== Test                      Result    Flag   Units      Ref Range   Creatinine              50               mg/dL      >=20 ==================================================================== Declared Medications:  The flagging and interpretation on this report are based on the  following declared medications.  Unexpected results may arise from  inaccuracies in the declared medications.  **Note: The testing scope of this panel includes these medications:  Tramadol  **Note: The testing scope of this panel does not include following  reported medications:  Mesalamine  Pantoprazole ==================================================================== For clinical consultation, please call [phone]. ====================================================================    Laboratory Chemistry Profile (12 mo)  Renal: 06/16/2018: Creatinine, Ser 0.80  Lab Results  Component Value Date   GFRAA >60 09/06/2016   GFRNONAA >60 09/06/2016   Hepatic: No results found for requested labs within last 8760 hours. Lab Results  Component Value Date   AST 27 09/05/2016   ALT 15 (L) 09/05/2016   Other: No results found for requested labs within last 8760 hours. Note: Above Lab results reviewed.  Imaging  CT ENTERO ABD/PELVIS W CONTAST CLINICAL DATA:  Evaluate for Crohn disease.  EXAM: CT ABDOMEN AND PELVIS WITH CONTRAST (ENTEROGRAPHY)  TECHNIQUE: Multidetector CT of the abdomen and pelvis during bolus administration of intravenous contrast. Negative oral contrast was given.  CONTRAST:  141m OMNIPAQUE IOHEXOL 300 MG/ML  SOLN  COMPARISON:  CT abdomen pelvis 07/18/2013  FINDINGS: Lower chest:  Unremarkable  Hepatobiliary: No suspicious  focal abnormality within the liver parenchyma. There is no evidence for gallstones, gallbladder wall thickening, or pericholecystic fluid. No intrahepatic or extrahepatic biliary dilation.  Pancreas: No focal mass lesion. No dilatation of the main duct. No intraparenchymal cyst. No peripancreatic edema.  Spleen: No splenomegaly. No focal mass lesion.  Adrenals/Urinary Tract: No adrenal nodule or mass. Kidneys unremarkable. Tiny hypodensities in each kidney are stable in the long interval since prior study compatible with benign etiology such as cysts. No evidence for hydroureter. The urinary bladder appears normal for the degree of distention.  Stomach/Bowel: Stomach is well distended without wall thickening or abnormal mural enhancement. Duodenum is normal. Duodenal diverticulum evident. No wall thickening or abnormal mural enhancement in the jejunum or ileum. No evidence for small bowel stricture. No abnormal inner wall or transmural hyperenhancement in the terminal ileum The appendix is normal. No gross colonic mass. No colonic wall thickening. Diverticular changes are noted in the left colon without evidence of diverticulitis.  Vascular/Lymphatic: There is abdominal aortic atherosclerosis without aneurysm. There is no gastrohepatic or hepatoduodenal ligament lymphadenopathy. No intraperitoneal or retroperitoneal lymphadenopathy. No pelvic sidewall lymphadenopathy.  Reproductive: Prostate gland appears mildly enlarged.  Other: No intraperitoneal free fluid.  Musculoskeletal: Small left  groin hernia contains only fat. No worrisome lytic or sclerotic osseous abnormality.  IMPRESSION: 1. No evidence for bowel wall thickening or abnormal mucosal/transmural hyperenhancement. No inflammatory or fibro stenotic small bowel stricture. 2. Left colonic diverticulosis without diverticulitis. 3.  Aortic Atherosclerois (ICD10-170.0)  Electronically Signed   By: Misty Stanley M.D.    On: 06/16/2018 17:23   Assessment  The primary encounter diagnosis was Chronic pain syndrome. Diagnoses of Chronic neck pain  (Primary Area of Pain) (Bilateral) (L>R), Chronic shoulder pain (Secondary Area of Pain) (Left), Chronic low back pain (Fourth Area of Pain) (Right) without sciatica, and Chronic knee pain (Fifth Area of Pain) (Bilateral) (R>L) were also pertinent to this visit.  Plan of Care  Problem-specific:  No problem-specific Assessment & Plan notes found for this encounter.  I am having Vangie Bicker. [place] maintain his pantoprazole, mesalamine, finasteride, and traMADol.  Pharmacotherapy (Medications Ordered): Meds ordered this encounter  Medications  . traMADol (ULTRAM) 50 MG tablet    Sig: Take 1 tablet (50 mg total) by mouth 2 (two) times daily.    Dispense:  60 tablet    Refill:  5    Chronic Pain: STOP Act (Not applicable) Fill 1 day early if closed on refill date. Do not fill until: 05/22/2019. To last until: 11/18/2019. Avoid benzodiazepines within 8 hours of opioids   Orders:  No orders of the defined types were placed in this encounter.  Follow-up plan:   Return in about 28 weeks (around 11/16/2019) for (VV), (MM).      Interventional management options:  Considering:   Diagnostic left-sided cervical facet nerve block Possible left-sided cervical facet RFA Diagnosticleft suprascapular nerve block Diagnostictrigger point injection Diagnosticright lumbar facet nerve block Diagnostic right lumbar facet RFA Diagnostic Left knee Hyalgan series Diagnostic Right knee genicular nerve block Possible right knee genicular RFA   Palliative PRN treatment(s):   Diagnostic left-sided CESI #3 (PRN)    Recent Visits No visits were found meeting these conditions.  Showing recent visits within past 90 days and meeting all other requirements   Today's Visits Date Type Provider Dept  05/04/19 Telemedicine Milinda Pointer, MD [facility]  Showing  today's visits and meeting all other requirements   Future Appointments No visits were found meeting these conditions.  Showing future appointments within next 90 days and meeting all other requirements   I discussed the assessment and treatment plan with the patient. The patient was provided an opportunity to ask questions and all were answered. The patient agreed with the plan and demonstrated an understanding of the instructions.  Patient advised to call back or seek an in-person evaluation if the symptoms or condition worsens.  Total duration of non-face-to-face encounter: 12 minutes.  Note by: Gaspar Cola, MD Date: 05/04/2019; Time: 12:35 PM

## 2019-05-04 ENCOUNTER — Ambulatory Visit: Payer: BC Managed Care – PPO | Attending: Pain Medicine | Admitting: Pain Medicine

## 2019-05-04 ENCOUNTER — Other Ambulatory Visit: Payer: Self-pay

## 2019-05-04 DIAGNOSIS — M25561 Pain in right knee: Secondary | ICD-10-CM

## 2019-05-04 DIAGNOSIS — M545 Low back pain: Secondary | ICD-10-CM

## 2019-05-04 DIAGNOSIS — M542 Cervicalgia: Secondary | ICD-10-CM | POA: Diagnosis not present

## 2019-05-04 DIAGNOSIS — M25512 Pain in left shoulder: Secondary | ICD-10-CM | POA: Diagnosis not present

## 2019-05-04 DIAGNOSIS — G894 Chronic pain syndrome: Secondary | ICD-10-CM

## 2019-05-04 DIAGNOSIS — G8929 Other chronic pain: Secondary | ICD-10-CM

## 2019-05-04 DIAGNOSIS — M25562 Pain in left knee: Secondary | ICD-10-CM

## 2019-05-04 MED ORDER — TRAMADOL HCL 50 MG PO TABS: 50.0000 mg | ORAL_TABLET | Freq: Two times a day (BID) | ORAL | 5 refills | Status: DC

## 2019-11-03 ENCOUNTER — Ambulatory Visit: Payer: BC Managed Care – PPO | Admitting: Pain Medicine

## 2019-11-08 ENCOUNTER — Ambulatory Visit: Payer: BC Managed Care – PPO | Admitting: Pain Medicine

## 2019-11-15 ENCOUNTER — Other Ambulatory Visit: Payer: Self-pay | Admitting: Pain Medicine

## 2019-11-15 ENCOUNTER — Encounter: Payer: Self-pay | Admitting: Pain Medicine

## 2019-11-15 DIAGNOSIS — G894 Chronic pain syndrome: Secondary | ICD-10-CM

## 2019-11-15 NOTE — Progress Notes (Signed)
Patient: Samuel Roberts  Service Category: E/M  Provider: Gaspar Cola, MD  DOB: [birth year]  DOS: 11/16/2019  Location: Office  MRN: [id number]  Setting: Ambulatory outpatient  Referring Provider: [name], MD  Type: Established Patient  Specialty: Interventional Pain Management  PCP: [name], MD  Location: Remote location  Delivery: TeleHealth     Virtual Encounter - Pain Management PROVIDER NOTE: Information contained herein reflects review and annotations entered in association with encounter. Interpretation of such information and data should be left to medically-trained personnel. Information provided to patient can be located elsewhere in the medical record under "Patient Instructions". Document created using STT-dictation technology, any transcriptional errors that may result from process are unintentional.    Contact & Pharmacy Preferred: [phone] Home: [phone] (home) Mobile: [phone] (mobile) E-mail: [email or web address]  [id number] [place] [address] [id number]: [phone]: [phone]  Pre-screening  Mr. Sacco offered "in-person" vs "virtual" encounter. He indicated preferring virtual for this encounter.   Reason COVID-19*  Social distancing based on CDC and AMA recommendations.   I contacted [name] 11/16/2019 via telephone.      I clearly identified myself as FGaspar Cola MD. I verified that I was speaking with the correct person using two identifiers (Name: DTRAYVION EMBLETON and date of birth: [birth year].  Consent I sought verbal advanced consent from DCaffie Pintofor virtual visit interactions. I informed Mr. Liss of possible security and privacy concerns, risks, and limitations associated with providing "not-in-person" medical evaluation and management services. I also informed Mr. Stebner of the availability of "in-person" appointments. Finally, I informed  him that there would be a charge for the virtual visit and that he could be  personally, fully or partially, financially responsible for it. Mr. NYaworskiexpressed understanding and agreed to proceed.   Historic Elements   Mr. DDOMNICK CHERVENAKis a 64y.o. year old, male patient evaluated today after his last contact with our practice on Visit date not found. Mr. NShake has a past medical history of BPH (benign prostatic hyperplasia) and Colitis, ulcerative (HOrin. He also  has a past surgical history that includes Knee arthroscopy and arthrotomy; Tonsillectomy; LEFT HEART CATH AND CORONARY ANGIOGRAPHY (Left, 09/09/2016); and Joint replacement (Right, 02/17/2018). Mr. NMcclaffertyhas a current medication list which includes the following prescription(s): finasteride, mesalamine, pantoprazole, and tramadol. He  reports that he has never smoked. He has never used smokeless tobacco. He reports that he does not drink alcohol and does not use drugs. Mr. NRakeshas No Known Allergies.   HPI  Today, he is being contacted for medication management. The patient indicates doing well with the current medication regimen. No adverse reactions or side effects reported to the medications.  The patient indicates that lately he has been experiencing more pain in the right side of the lower back.  In the past he has had some trigger point injections done.  He indicates that this was done by our practice, but it looks like it was before we switch to [facility].  In any case, he wants to come in for a trigger point without sedation.  He also indicates that his cervical radicular symptoms are also beginning to come back, but he wants to address those later.  Today we have ordered a UDS to update his.  Pharmacotherapy Assessment  Analgesic: Tramadol 50 mg, 1 tab PO BID (100 mg/day of tramadol) MME/day:149mday.  Monitoring:  PMP: PDMP reviewed during this encounter.       Pharmacotherapy: No side-effects or adverse reactions  reported. Compliance: No problems identified. Effectiveness: Clinically acceptable. Plan: Refer to "POC".  UDS:  Summary  Date Value Ref Range Status  02/11/2018 FINAL  Final    Comment:    ==================================================================== TOXASSURE SELECT 13 (MW) ==================================================================== Test                             Result       Flag       Units Drug Present and Declared for Prescription Verification   Tramadol                       >10000       EXPECTED   ng/mg creat   O-Desmethyltramadol            >10000       EXPECTED   ng/mg creat   N-Desmethyltramadol            1584         EXPECTED   ng/mg creat    Source of tramadol is a prescription medication.    O-desmethyltramadol and N-desmethyltramadol are expected    metabolites of tramadol. ==================================================================== Test                      Result    Flag   Units      Ref Range   Creatinine              50               mg/dL      >=20 ==================================================================== Declared Medications:  The flagging and interpretation on this report are based on the  following declared medications.  Unexpected results may arise from  inaccuracies in the declared medications.  **Note: The testing scope of this panel includes these medications:  Tramadol  **Note: The testing scope of this panel does not include following  reported medications:  Mesalamine  Pantoprazole ==================================================================== For clinical consultation, please call [phone]. ====================================================================     Laboratory Chemistry Profile   Renal Lab Results  Component Value Date   BUN 17 09/06/2016   CREATININE 0.80 06/16/2018   GFRAA >60 09/06/2016   GFRNONAA >60 09/06/2016     Hepatic Lab Results  Component Value Date   AST 27  09/05/2016   ALT 15 (L) 09/05/2016   ALBUMIN 4.1 09/05/2016   ALKPHOS 62 09/05/2016     Electrolytes Lab Results  Component Value Date   NA 138 09/06/2016   K 5.1 09/06/2016   CL 103 09/06/2016   CALCIUM 9.3 09/06/2016   MG 2.4 (H) 06/24/2017     Bone Lab Results  Component Value Date   25OHVITD1 37 06/24/2017   25OHVITD2 2.2 06/24/2017   25OHVITD3 35 06/24/2017     Inflammation (CRP: Acute Phase) (ESR: Chronic Phase) Lab Results  Component Value Date   CRP 2.8 06/24/2017   ESRSEDRATE 11 06/24/2017       Note: Above Lab results reviewed.   Imaging  CT ENTERO ABD/PELVIS W CONTAST CLINICAL DATA:  Evaluate for Crohn disease.  EXAM: CT ABDOMEN AND PELVIS WITH CONTRAST (ENTEROGRAPHY)  TECHNIQUE: Multidetector CT of the abdomen and pelvis during bolus administration of intravenous contrast. Negative oral contrast was given.  CONTRAST:  171m OMNIPAQUE IOHEXOL 300 MG/ML  SOLN  COMPARISON:  CT abdomen pelvis 07/18/2013  FINDINGS: Lower chest:  Unremarkable  Hepatobiliary: No suspicious focal abnormality within the liver parenchyma. There is no evidence for gallstones, gallbladder wall thickening, or pericholecystic fluid. No intrahepatic or extrahepatic biliary dilation.  Pancreas: No focal mass lesion. No dilatation of the main duct. No intraparenchymal cyst. No peripancreatic edema.  Spleen: No splenomegaly. No focal mass lesion.  Adrenals/Urinary Tract: No adrenal nodule or mass. Kidneys unremarkable. Tiny hypodensities in each kidney are stable in the long interval since prior study compatible with benign etiology such as cysts. No evidence for hydroureter. The urinary bladder appears normal for the degree of distention.  Stomach/Bowel: Stomach is well distended without wall thickening or abnormal mural enhancement. Duodenum is normal. Duodenal diverticulum evident. No wall thickening or abnormal mural enhancement in the jejunum or ileum. No evidence  for small bowel stricture. No abnormal inner wall or transmural hyperenhancement in the terminal ileum The appendix is normal. No gross colonic mass. No colonic wall thickening. Diverticular changes are noted in the left colon without evidence of diverticulitis.  Vascular/Lymphatic: There is abdominal aortic atherosclerosis without aneurysm. There is no gastrohepatic or hepatoduodenal ligament lymphadenopathy. No intraperitoneal or retroperitoneal lymphadenopathy. No pelvic sidewall lymphadenopathy.  Reproductive: Prostate gland appears mildly enlarged.  Other: No intraperitoneal free fluid.  Musculoskeletal: Small left groin hernia contains only fat. No worrisome lytic or sclerotic osseous abnormality.  IMPRESSION: 1. No evidence for bowel wall thickening or abnormal mucosal/transmural hyperenhancement. No inflammatory or fibro stenotic small bowel stricture. 2. Left colonic diverticulosis without diverticulitis. 3.  Aortic Atherosclerois (ICD10-170.0)  Electronically Signed   By: Misty Stanley M.D.   On: 06/16/2018 17:23  Assessment  The primary encounter diagnosis was Chronic pain syndrome. Diagnoses of Chronic neck pain  (Primary Area of Pain) (Bilateral) (L>R), Chronic shoulder pain (Secondary Area of Pain) (Left), Occipital neuralgia (Tertiary Area of Pain) (Left), Chronic low back pain (Fourth Area of Pain) (Right) without sciatica, Chronic knee pain (Fifth Area of Pain) (Bilateral) (R>L), and Pharmacologic therapy were also pertinent to this visit.  Plan of Care  Problem-specific:  No problem-specific Assessment & Plan notes found for this encounter.  Mr. ROSHAWN AYALA has a current medication list which includes the following long-term medication(s): pantoprazole and tramadol.  Pharmacotherapy (Medications Ordered): No orders of the defined types were placed in this encounter.  Orders:  No orders of the defined types were placed in this encounter.  Follow-up  plan:   Return in about 13 weeks (around 02/15/2020) for 20-min, F2F, MM (on eval day) to review UDS.      Interventional management options:  Considering:   Diagnostic left-sided cervical facet nerve block Possible left-sided cervical facet RFA Diagnosticleft suprascapular nerve block Diagnostictrigger point injection Diagnosticright lumbar facet nerve block Diagnostic right lumbar facet RFA Diagnostic Left knee Hyalgan series Diagnostic Right knee genicular nerve block Possible right knee genicular RFA   Palliative PRN treatment(s):   Diagnostic left-sided CESI #3 (PRN)     Recent Visits No visits were found meeting these conditions. Showing recent visits within past 90 days and meeting all other requirements Future Appointments Date Type Provider Dept  11/16/19 Telemedicine Milinda Pointer, MD [facility]  Showing future appointments within next 90 days and meeting all other requirements  I discussed the assessment and treatment plan with the patient. The patient was provided an opportunity to ask questions and all were answered. The patient agreed with the plan and demonstrated an understanding of the instructions.  Patient advised to call back or seek an in-person evaluation if the symptoms or condition worsens.  Duration of encounter: 15 minutes.  Note by: Gaspar Cola, MD Date: 11/16/2019; Time: 4:36 PM

## 2019-11-16 ENCOUNTER — Other Ambulatory Visit: Payer: Self-pay

## 2019-11-16 ENCOUNTER — Ambulatory Visit: Payer: BC Managed Care – PPO | Attending: Pain Medicine | Admitting: Pain Medicine

## 2019-11-16 DIAGNOSIS — M25561 Pain in right knee: Secondary | ICD-10-CM

## 2019-11-16 DIAGNOSIS — Z79899 Other long term (current) drug therapy: Secondary | ICD-10-CM

## 2019-11-16 DIAGNOSIS — M25512 Pain in left shoulder: Secondary | ICD-10-CM | POA: Diagnosis not present

## 2019-11-16 DIAGNOSIS — M25562 Pain in left knee: Secondary | ICD-10-CM

## 2019-11-16 DIAGNOSIS — M542 Cervicalgia: Secondary | ICD-10-CM

## 2019-11-16 DIAGNOSIS — G894 Chronic pain syndrome: Secondary | ICD-10-CM

## 2019-11-16 DIAGNOSIS — M5481 Occipital neuralgia: Secondary | ICD-10-CM | POA: Diagnosis not present

## 2019-11-16 DIAGNOSIS — G8929 Other chronic pain: Secondary | ICD-10-CM

## 2019-11-16 DIAGNOSIS — M545 Low back pain, unspecified: Secondary | ICD-10-CM

## 2019-11-16 DIAGNOSIS — M549 Dorsalgia, unspecified: Secondary | ICD-10-CM | POA: Insufficient documentation

## 2019-11-16 MED ORDER — TRAMADOL HCL 50 MG PO TABS: 50.0000 mg | ORAL_TABLET | Freq: Two times a day (BID) | ORAL | 2 refills | Status: DC

## 2019-11-16 NOTE — Patient Instructions (Signed)
____________________________________________________________________________________________  Preparing for your procedure (without sedation)  Procedure appointments are limited to planned procedures: . No Prescription Refills. . No disability issues will be discussed. . No medication changes will be discussed.  Instructions: . Oral Intake: Do not eat or drink anything for at least 6 hours prior to your procedure. (Exception: Blood Pressure Medication. See below.) . Transportation: Unless otherwise stated by your physician, you may drive yourself after the procedure. . Blood Pressure Medicine: Do not forget to take your blood pressure medicine with a sip of water the morning of the procedure. If your Diastolic (lower reading)is above 100 mmHg, elective cases will be cancelled/rescheduled. . Blood thinners: These will need to be stopped for procedures. Notify our staff if you are taking any blood thinners. Depending on which one you take, there will be specific instructions on how and when to stop it. . Diabetics on insulin: Notify the staff so that you can be scheduled 1st case in the morning. If your diabetes requires high dose insulin, take only  of your normal insulin dose the morning of the procedure and notify the staff that you have done so. . Preventing infections: Shower with an antibacterial soap the morning of your procedure.  . Build-up your immune system: Take 1000 mg of Vitamin C with every meal (3 times a day) the day prior to your procedure. Marland Kitchen Antibiotics: Inform the staff if you have a condition or reason that requires you to take antibiotics before dental procedures. . Pregnancy: If you are pregnant, call and cancel the procedure. . Sickness: If you have a cold, fever, or any active infections, call and cancel the procedure. . Arrival: You must be in the facility at least 30 minutes prior to your scheduled procedure. . Children: Do not bring any children with you. . Dress  appropriately: Bring dark clothing that you would not mind if they get stained. . Valuables: Do not bring any jewelry or valuables.  Reasons to call and reschedule or cancel your procedure: (Following these recommendations will minimize the risk of a serious complication.) . Surgeries: Avoid having procedures within 2 weeks of any surgery. (Avoid for 2 weeks before or after any surgery). . Flu Shots: Avoid having procedures within 2 weeks of a flu shots or . (Avoid for 2 weeks before or after immunizations). . Barium: Avoid having a procedure within 7-10 days after having had a radiological study involving the use of radiological contrast. (Myelograms, Barium swallow or enema study). . Heart attacks: Avoid any elective procedures or surgeries for the initial 6 months after a "Myocardial Infarction" (Heart Attack). . Blood thinners: It is imperative that you stop these medications before procedures. Let us know if you if you take any blood thinner.  . Infection: Avoid procedures during or within two weeks of an infection (including chest colds or gastrointestinal problems). Symptoms associated with infections include: Localized redness, fever, chills, night sweats or profuse sweating, burning sensation when voiding, cough, congestion, stuffiness, runny nose, sore throat, diarrhea, nausea, vomiting, cold or Flu symptoms, recent or current infections. It is specially important if the infection is over the area that we intend to treat. Marland Kitchen Heart and lung problems: Symptoms that may suggest an active cardiopulmonary problem include: cough, chest pain, breathing difficulties or shortness of breath, dizziness, ankle swelling, uncontrolled high or unusually low blood pressure, and/or palpitations. If you are experiencing any of these symptoms, cancel your procedure and contact your primary care physician for an evaluation.  Remember:  Regular  Business hours are:  Monday to Thursday 8:00 AM to 4:00  PM  Provider's Schedule: Milinda Pointer, MD:  Procedure days: Tuesday and Thursday 7:30 AM to 4:00 PM  Gillis Santa, MD:  Procedure days: Monday and Wednesday 7:30 AM to 4:00 PM ____________________________________________________________________________________________

## 2019-11-20 LAB — TOXASSURE SELECT 13 (MW), URINE

## 2019-11-24 ENCOUNTER — Ambulatory Visit: Payer: BC Managed Care – PPO | Admitting: Pain Medicine

## 2019-11-25 ENCOUNTER — Emergency Department
Admission: EM | Admit: 2019-11-25 | Discharge: 2019-11-25 | Disposition: A | Discharge status: Home / Self Care | Payer: BC Managed Care – PPO | Attending: Emergency Medicine | Admitting: Emergency Medicine

## 2019-11-25 ENCOUNTER — Encounter: Payer: Self-pay | Admitting: Emergency Medicine

## 2019-11-25 ENCOUNTER — Other Ambulatory Visit: Payer: Self-pay

## 2019-11-25 ENCOUNTER — Emergency Department: Payer: BC Managed Care – PPO

## 2019-11-25 DIAGNOSIS — Z5321 Procedure and treatment not carried out due to patient leaving prior to being seen by health care provider: Secondary | ICD-10-CM | POA: Insufficient documentation

## 2019-11-25 DIAGNOSIS — R0789 Other chest pain: Secondary | ICD-10-CM | POA: Insufficient documentation

## 2019-11-25 LAB — CBC
HCT: 38.1 % — ABNORMAL LOW (ref 39.0–52.0)
Hemoglobin: 13.8 g/dL (ref 13.0–17.0)
MCH: 32.2 pg (ref 26.0–34.0)
MCHC: 36.2 g/dL — ABNORMAL HIGH (ref 30.0–36.0)
MCV: 88.8 fL (ref 80.0–100.0)
Platelets: 280 10*3/uL (ref 150–400)
RBC: 4.29 MIL/uL (ref 4.22–5.81)
RDW: 13.1 % (ref 11.5–15.5)
WBC: 7.3 10*3/uL (ref 4.0–10.5)
nRBC: 0 % (ref 0.0–0.2)

## 2019-11-25 LAB — BASIC METABOLIC PANEL
Anion gap: 8 (ref 5–15)
BUN: 14 mg/dL (ref 8–23)
CO2: 24 mmol/L (ref 22–32)
Calcium: 8.9 mg/dL (ref 8.9–10.3)
Chloride: 104 mmol/L (ref 98–111)
Creatinine, Ser: 0.76 mg/dL (ref 0.61–1.24)
GFR calc Af Amer: >60 mL/min (ref >60)
GFR calc non Af Amer: >60 mL/min (ref >60)
Glucose, Bld: 104 mg/dL — ABNORMAL HIGH (ref 70–99)
Potassium: 4.2 mmol/L (ref 3.5–5.1)
Sodium: 136 mmol/L (ref 135–145)

## 2019-11-25 LAB — TROPONIN I (HIGH SENSITIVITY)
Troponin I (High Sensitivity): <2 ng/L (ref <18)
Troponin I (High Sensitivity): <2 ng/L (ref <18)

## 2019-11-25 NOTE — ED Triage Notes (Signed)
Pt here for pain to left chest that radiates to shoulder and down arm.  Pain was intermittent starting couple days ago but now constant.  Unlabored. VSS. Color WNL.

## 2019-12-01 ENCOUNTER — Ambulatory Visit: Payer: BC Managed Care – PPO | Admitting: Pain Medicine

## 2019-12-08 ENCOUNTER — Ambulatory Visit: Payer: BC Managed Care – PPO | Admitting: Pain Medicine

## 2019-12-15 ENCOUNTER — Ambulatory Visit: Payer: BC Managed Care – PPO | Admitting: Pain Medicine

## 2020-01-05 ENCOUNTER — Other Ambulatory Visit: Payer: Self-pay

## 2020-01-05 ENCOUNTER — Encounter: Payer: Self-pay | Admitting: Pain Medicine

## 2020-01-05 ENCOUNTER — Ambulatory Visit: Payer: BC Managed Care – PPO | Attending: Pain Medicine | Admitting: Pain Medicine

## 2020-01-05 VITALS — BP 124/88 | HR 75 | Temp 97.9°F | Resp 16 | Ht 70.0 in | Wt 200.0 lb

## 2020-01-05 DIAGNOSIS — M545 Low back pain, unspecified: Secondary | ICD-10-CM

## 2020-01-05 DIAGNOSIS — M549 Dorsalgia, unspecified: Secondary | ICD-10-CM

## 2020-01-05 DIAGNOSIS — G8929 Other chronic pain: Secondary | ICD-10-CM | POA: Insufficient documentation

## 2020-01-05 MED ORDER — TRIAMCINOLONE ACETONIDE 40 MG/ML IJ SUSP
40.0000 mg | Freq: Once | INTRAMUSCULAR | Status: AC
  Administered 2020-01-05: 40 mg

## 2020-01-05 MED ORDER — TRIAMCINOLONE ACETONIDE 40 MG/ML IJ SUSP
INTRAMUSCULAR | Status: AC
  Filled 2020-01-05: qty 1

## 2020-01-05 MED ORDER — ROPIVACAINE HCL 2 MG/ML IJ SOLN
9.0000 mL | Freq: Once | INTRAMUSCULAR | Status: AC
  Administered 2020-01-05: 9 mL

## 2020-01-05 MED ORDER — LIDOCAINE HCL 2 % IJ SOLN
INTRAMUSCULAR | Status: AC
  Filled 2020-01-05: qty 20

## 2020-01-05 MED ORDER — ROPIVACAINE HCL 2 MG/ML IJ SOLN
INTRAMUSCULAR | Status: AC
  Filled 2020-01-05: qty 10

## 2020-01-05 MED ORDER — LIDOCAINE HCL 2 % IJ SOLN
20.0000 mL | Freq: Once | INTRAMUSCULAR | Status: AC
  Administered 2020-01-05: 400 mg

## 2020-01-05 NOTE — Progress Notes (Signed)
Safety precautions to be maintained throughout the outpatient stay will include: orient to surroundings, keep bed in low position, maintain call bell within reach at all times, provide assistance with transfer out of bed and ambulation.  

## 2020-01-05 NOTE — Progress Notes (Signed)
PROVIDER NOTE: Information contained herein reflects review and annotations entered in association with encounter. Interpretation of such information and data should be left to medically-trained personnel. Information provided to patient can be located elsewhere in the medical record under "Patient Instructions". Document created using STT-dictation technology, any transcriptional errors that may result from process are unintentional.    Patient: Samuel Roberts  Service Category: Procedure  Provider: Gaspar Cola, MD  DOB: 03-17-1956  DOS: 01/05/2020  Location: [facility]  MRN: [id number]  Setting: Ambulatory - outpatient  Referring Provider: [name], MD  Type: Established Patient  Specialty: Interventional Pain Management  PCP: [name], MD   Primary Reason for Visit: Interventional Pain Management Treatment. CC: Back Pain (lower)  Procedure:          Anesthesia, Analgesia, Anxiolysis:  Type: Lumbar paravertebral Trigger Point Injection (1-2 muscle groups) #2  CPT: 20552 Primary Purpose: Therapeutic Region: Lower Lumbosacral Level: PSIS Target Area: Erector spinae muscle and quadratus lumborum muscle Trigger Point Approach: Percutaneous, ipsilateral approach. Laterality: Right-Sided Paraspinal  Type: Local Anesthesia Indication(s): Analgesia         Local Anesthetic: Lidocaine 1-2% Route: Infiltration ([facility]/IM) IV Access: Declined Sedation: Declined   Position: Sitting   Indications: 1. Trigger point with back pain (Right)   2. Chronic low back pain (Fourth Area of Pain) (Right) without sciatica    Pain Score: Pre-procedure: 6 /10 Post-procedure: 6 /10   Pre-op Assessment:  Samuel Roberts is a 64 y.o. (year old), male patient, seen today for interventional treatment. He  has a past surgical history that includes Knee arthroscopy and arthrotomy; Tonsillectomy; LEFT HEART CATH AND CORONARY ANGIOGRAPHY (Left, 09/09/2016); and Joint replacement  (Right, 02/17/2018). Samuel Roberts has a current medication list which includes the following prescription(s): finasteride, mesalamine, pantoprazole, and tramadol. His primarily concern today is the Back Pain (lower)  Initial Vital Signs:  Pulse/HCG Rate: 75  Temp: 97.9 F (36.6 C) Resp: 16 BP: 124/88 SpO2: 98 %  BMI: Estimated body mass index is 28.7 kg/m as calculated from the following:   Height as of this encounter: 5' 10"  (1.778 m).   Weight as of this encounter: 200 lb (90.7 kg).  Risk Assessment: Allergies: Reviewed. He has No Known Allergies.  Allergy Precautions: None required Coagulopathies: Reviewed. None identified.  Blood-thinner therapy: None at this time Active Infection(s): Reviewed. None identified. Samuel Roberts is afebrile  Site Confirmation: Samuel Roberts was asked to confirm the procedure and laterality before marking the site Procedure checklist: Completed Consent: Before the procedure and under the influence of no sedative(s), amnesic(s), or anxiolytics, the patient was informed of the treatment options, risks and possible complications. To fulfill our ethical and legal obligations, as recommended by the American Medical Association's Code of Ethics, I have informed the patient of my clinical impression; the nature and purpose of the treatment or procedure; the risks, benefits, and possible complications of the intervention; the alternatives, including doing nothing; the risk(s) and benefit(s) of the alternative treatment(s) or procedure(s); and the risk(s) and benefit(s) of doing nothing. The patient was provided information about the general risks and possible complications associated with the procedure. These may include, but are not limited to: failure to achieve desired goals, infection, bleeding, organ or nerve damage, allergic reactions, paralysis, and death. In addition, the patient was informed of those risks and complications associated to the procedure, such as  failure to decrease pain; infection; bleeding; organ or nerve damage with subsequent damage to sensory, motor, and/or  autonomic systems, resulting in permanent pain, numbness, and/or weakness of one or several areas of the body; allergic reactions; (i.e.: anaphylactic reaction); and/or death. Furthermore, the patient was informed of those risks and complications associated with the medications. These include, but are not limited to: allergic reactions (i.e.: anaphylactic or anaphylactoid reaction(s)); adrenal axis suppression; blood sugar elevation that in diabetics may result in ketoacidosis or comma; water retention that in patients with history of congestive heart failure may result in shortness of breath, pulmonary edema, and decompensation with resultant heart failure; weight gain; swelling or edema; medication-induced neural toxicity; particulate matter embolism and blood vessel occlusion with resultant organ, and/or nervous system infarction; and/or aseptic necrosis of one or more joints. Finally, the patient was informed that Medicine is not an exact science; therefore, there is also the possibility of unforeseen or unpredictable risks and/or possible complications that may result in a catastrophic outcome. The patient indicated having understood very clearly. We have given the patient no guarantees and we have made no promises. Enough time was given to the patient to ask questions, all of which were answered to the patient's satisfaction. Samuel Roberts has indicated that he wanted to continue with the procedure. Attestation: I, the ordering provider, attest that I have discussed with the patient the benefits, risks, side-effects, alternatives, likelihood of achieving goals, and potential problems during recovery for the procedure that I have provided informed consent. Date  Time: 01/05/2020 11:50 AM  Pre-Procedure Preparation:  Monitoring: As per clinic protocol. Respiration, ETCO2, SpO2, BP, heart rate  and rhythm monitor placed and checked for adequate function Safety Precautions: Patient was assessed for positional comfort and pressure points before starting the procedure. Time-out: I initiated and conducted the "Time-out" before starting the procedure, as per protocol. The patient was asked to participate by confirming the accuracy of the "Time Out" information. Verification of the correct person, site, and procedure were performed and confirmed by me, the nursing staff, and the patient. "Time-out" conducted as per Joint Commission's Universal Protocol (UP.01.01.01). Time: 1216  Description of Procedure:          Area Prepped: Entire Lower Lumbosacral Region DuraPrep (Iodine Povacrylex [0.7% available iodine] and Isopropyl Alcohol, 74% w/w) Safety Precautions: Aspiration looking for blood return was conducted prior to all injections. At no point did we inject any substances, as a needle was being advanced. No attempts were made at seeking any paresthesias. Safe injection practices and needle disposal techniques used. Medications properly checked for expiration dates. SDV (single dose vial) medications used. Description of the Procedure: Protocol guidelines were followed. The patient was placed in position over the fluoroscopy table. The target area was identified and the area prepped in the usual manner. Skin & deeper tissues infiltrated with local anesthetic. Appropriate amount of time allowed to pass for local anesthetics to take effect. The procedure needles were then advanced to the target area. Proper needle placement secured. Negative aspiration confirmed. Solution injected in intermittent fashion, asking for systemic symptoms every 0.5cc of injectate. The needles were then removed and the area cleansed, making sure to leave some of the prepping solution back to take advantage of its long term bactericidal properties.  Vitals:   01/05/20 1149  BP: 124/88  Pulse: 75  Resp: 16  Temp: 97.9 F  (36.6 C)  TempSrc: Temporal  SpO2: 98%  Weight: 200 lb (90.7 kg)  Height: 5' 10"  (1.778 m)    Start Time: 1216 hrs. End Time: 1217 hrs. Materials:  Needle(s) Type: Epidural needle  Gauge: 20G Length: 3.5-in Medication(s): Please see orders for medications and dosing details.  Imaging Guidance:          Type of Imaging Technique: None used Indication(s): N/A Exposure Time: No patient exposure Contrast: None used. Fluoroscopic Guidance: N/A Ultrasound Guidance: N/A Interpretation: N/A  Antibiotic Prophylaxis:   Anti-infectives (From admission, onward)   None     Indication(s): None identified  Post-operative Assessment:  Post-procedure Vital Signs:  Pulse/HCG Rate: 75  Temp: 97.9 F (36.6 C) Resp: 16 BP: 124/88 SpO2: 98 %  EBL: None  Complications: No immediate post-treatment complications observed by team, or reported by patient.  Note: The patient tolerated the entire procedure well. A repeat set of vitals were taken after the procedure and the patient was kept under observation following institutional policy, for this type of procedure. Post-procedural neurological assessment was performed, showing return to baseline, prior to discharge. The patient was provided with post-procedure discharge instructions, including a section on how to identify potential problems. Should any problems arise concerning this procedure, the patient was given instructions to immediately contact us, at any time, without hesitation. In any case, we plan to contact the patient by telephone for a follow-up status report regarding this interventional procedure.  Comments:  No additional relevant information.  Plan of Care  Orders:  Orders Placed This Encounter  Procedures  . TRIGGER POINT INJECTION    Scheduling Instructions:     Area: Lower Back     Side: Right     Sedation: No Sedation.     Timeframe: Today    Order Specific Question:   Where will this procedure be performed?     Answer:   [facility]  . Informed Consent Details: Physician/Practitioner Attestation; Transcribe to consent form and obtain patient signature    Provider Attestation: I, [place] Dossie Arbour, MD, (Pain Management Specialist), the physician/practitioner, attest that I have discussed with the patient the benefits, risks, side effects, alternatives, likelihood of achieving goals and potential problems during recovery for the procedure that I have provided informed consent.    Scheduling Instructions:     Procedure: Myoneural Block (Trigger Point injection)     Indications: Musculoskeletal pain/myofascial pain secondary to trigger point     Note: Always confirm laterality of pain with Mr. Kaufmann, before procedure.     Transcribe to consent form and obtain patient signature.   Chronic Opioid Analgesic:  Tramadol 50 mg, 1 tab PO BID (100 mg/day of tramadol) MME/day:98m/day.   Medications ordered for procedure: Meds ordered this encounter  Medications  . triamcinolone acetonide (KENALOG-40) injection 40 mg  . ropivacaine (PF) 2 mg/mL (0.2%) (NAROPIN) injection 9 mL  . lidocaine (XYLOCAINE) 2 % (with pres) injection 400 mg   Medications administered: We administered triamcinolone acetonide, ropivacaine (PF) 2 mg/mL (0.2%), and lidocaine.  See the medical record for exact dosing, route, and time of administration.  Follow-up plan:   Return in about 2 weeks (around 01/19/2020) for (VV), (PP) Follow-up.       Interventional management options:  Considering:   Diagnostic left-sided cervical facet nerve block Possible left-sided cervical facet RFA Diagnosticleft suprascapular nerve block Diagnostictrigger point injection Diagnosticright lumbar facet nerve block Diagnostic right lumbar facet RFA Diagnostic Left knee Hyalgan series Diagnostic Right knee genicular nerve block Possible right knee genicular RFA   Palliative PRN treatment(s):   Diagnostic left-sided CESI  #3 (PRN)      Recent Visits Date Type Provider Dept  01/05/20 Procedure visit NMilinda Pointer MD  [facility]  11/16/19 Telemedicine Milinda Pointer, MD [facility]  Showing recent visits within past 90 days and meeting all other requirements Future Appointments Date Type Provider Dept  01/26/20 Appointment Milinda Pointer, MD [facility]  02/08/20 Appointment Milinda Pointer, MD [facility]  Showing future appointments within next 90 days and meeting all other requirements  Disposition: Discharge home  Discharge (Date  Time): 01/05/2020; 1219 hrs.   Primary Care Physician: [name], MD Location: [facility] Note by: Gaspar Cola, MD Date: 01/05/2020; Time: 5:49 AM  Disclaimer:  Medicine is not an Chief Strategy Officer. The only guarantee in medicine is that nothing is guaranteed. It is important to note that the decision to proceed with this intervention was based on the information collected from the patient. The Data and conclusions were drawn from the patient's questionnaire, the interview, and the physical examination. Because the information was provided in large part by the patient, it cannot be guaranteed that it has not been purposely or unconsciously manipulated. Every effort has been made to obtain as much relevant data as possible for this evaluation. It is important to note that the conclusions that lead to this procedure are derived in large part from the available data. Always take into account that the treatment will also be dependent on availability of resources and existing treatment guidelines, considered by other Pain Management Practitioners as being common knowledge and practice, at the time of the intervention. For Medico-Legal purposes, it is also important to point out that variation in procedural techniques and pharmacological choices are the acceptable norm. The indications,  contraindications, technique, and results of the above procedure should only be interpreted and judged by a Board-Certified Interventional Pain Specialist with extensive familiarity and expertise in the same exact procedure and technique.

## 2020-01-05 NOTE — Patient Instructions (Signed)
____________________________________________________________________________________________  Post-Procedure Discharge Instructions  Instructions:  Apply ice:   Purpose: This will minimize any swelling and discomfort after procedure.   When: Day of procedure, as soon as you get home.  How: Fill a plastic sandwich bag with crushed ice. Cover it with a small towel and apply to injection site.  How long: (15 min on, 15 min off) Apply for 15 minutes then remove x 15 minutes.  Repeat sequence on day of procedure, until you go to bed.  Apply heat:   Purpose: To treat any soreness and discomfort from the procedure.  When: Starting the next day after the procedure.  How: Apply heat to procedure site starting the day following the procedure.  How long: May continue to repeat daily, until discomfort goes away.  Food intake: Start with clear liquids (like water) and advance to regular food, as tolerated.   Physical activities: Keep activities to a minimum for the first 8 hours after the procedure. After that, then as tolerated.  Driving: If you have received any sedation, be responsible and do not drive. You are not allowed to drive for 24 hours after having sedation.  Blood thinner: (Applies only to those taking blood thinners) You may restart your blood thinner 6 hours after your procedure.  Insulin: (Applies only to Diabetic patients taking insulin) As soon as you can eat, you may resume your normal dosing schedule.  Infection prevention: Keep procedure site clean and dry. Shower daily and clean area with soap and water.  Post-procedure Pain Diary: Extremely important that this be done correctly and accurately. Recorded information will be used to determine the next step in treatment. For the purpose of accuracy, follow these rules:  Evaluate only the area treated. Do not report or include pain from an untreated area. For the purpose of this evaluation, ignore all other areas of pain,  except for the treated area.  After your procedure, avoid taking a long nap and attempting to complete the pain diary after you wake up. Instead, set your alarm clock to go off every hour, on the hour, for the initial 8 hours after the procedure. Document the duration of the numbing medicine, and the relief you are getting from it.  Do not go to sleep and attempt to complete it later. It will not be accurate. If you received sedation, it is likely that you were given a medication that may cause amnesia. Because of this, completing the diary at a later time may cause the information to be inaccurate. This information is needed to plan your care.  Follow-up appointment: Keep your post-procedure follow-up evaluation appointment after the procedure (usually 2 weeks for most procedures, 6 weeks for radiofrequencies). DO NOT FORGET to bring you pain diary with you.   Expect: (What should I expect to see with my procedure?)  From numbing medicine (AKA: Local Anesthetics): Numbness or decrease in pain. You may also experience some weakness, which if present, could last for the duration of the local anesthetic.  Onset: Full effect within 15 minutes of injected.  Duration: It will depend on the type of local anesthetic used. On the average, 1 to 8 hours.   From steroids (Applies only if steroids were used): Decrease in swelling or inflammation. Once inflammation is improved, relief of the pain will follow.  Onset of benefits: Depends on the amount of swelling present. The more swelling, the longer it will take for the benefits to be seen. In some cases, up to 10 days.  Duration: Steroids will stay in the system x 2 weeks. Duration of benefits will depend on multiple posibilities including persistent irritating factors.  Side-effects: If present, they may typically last 2 weeks (the duration of the steroids).  Frequent: Cramps (if they occur, drink Gatorade and take over-the-counter Magnesium 450-500 mg  once to twice a day); water retention with temporary weight gain; increases in blood sugar; decreased immune system response; increased appetite.  Occasional: Facial flushing (red, warm cheeks); mood swings; menstrual changes.  Uncommon: Long-term decrease or suppression of natural hormones; bone thinning. (These are more common with higher doses or more frequent use. This is why we prefer that our patients avoid having any injection therapies in other practices.)   Very Rare: Severe mood changes; psychosis; aseptic necrosis.  From procedure: Some discomfort is to be expected once the numbing medicine wears off. This should be minimal if ice and heat are applied as instructed.  Call if: (When should I call?)  You experience numbness and weakness that gets worse with time, as opposed to wearing off.  New onset bowel or bladder incontinence. (Applies only to procedures done in the spine)  Emergency Numbers:  Durning business hours (Monday - Thursday, 8:00 AM - 4:00 PM) (Friday, 9:00 AM - 12:00 Noon): [phone]  After hours: [phone]  NOTE: If you are having a problem and are unable connect with, or to talk to a provider, then go to your nearest urgent care or emergency department. If the problem is serious and urgent, please call 911. ____________________________________________________________________________________________  ____________________________________________________________________________________________  Virtual Visits   Eligibility In order to be eligible for "Virtual Visit Encounters", you must be available over the phone. We understand how people are reluctant to pickup on "unknown" calls. We suggest adding the following telephone numbers to your phone, as a "CONTACT".  When will this type of visits be use? On a case by case, as decided by your provider.  Can I request my medication visits to be "Virtual"? No. This is decided by your provider. Control  substances require specific monitoring that requires Face-to-Face encounters. The number of encounters  and the extent of the monitoring is determined on a case by case basis.  Add a new contact to your smart phone and label it "PAIN CLINIC" Under this contact add the following numbers: Main: [phone] Nurses: [phone] Dr. Dossie Arbour: [phone] or [phone]  ____________________________________________________________________________________________

## 2020-01-06 ENCOUNTER — Telehealth: Payer: Self-pay

## 2020-01-06 NOTE — Telephone Encounter (Signed)
Post procedure phone call.   No answer.

## 2020-01-26 ENCOUNTER — Other Ambulatory Visit: Payer: Self-pay

## 2020-01-26 ENCOUNTER — Ambulatory Visit: Payer: BC Managed Care – PPO | Attending: Pain Medicine | Admitting: Pain Medicine

## 2020-01-26 DIAGNOSIS — M47816 Spondylosis without myelopathy or radiculopathy, lumbar region: Secondary | ICD-10-CM

## 2020-01-26 DIAGNOSIS — G894 Chronic pain syndrome: Secondary | ICD-10-CM

## 2020-01-26 DIAGNOSIS — M545 Low back pain, unspecified: Secondary | ICD-10-CM

## 2020-01-26 DIAGNOSIS — G8929 Other chronic pain: Secondary | ICD-10-CM

## 2020-01-26 MED ORDER — TRAMADOL HCL 50 MG PO TABS: 50.0000 mg | ORAL_TABLET | Freq: Two times a day (BID) | ORAL | 2 refills | Status: DC

## 2020-01-26 NOTE — Progress Notes (Signed)
Patient: Samuel Roberts  Service Category: E/M  Provider: Gaspar Cola, MD  DOB: August 18, 1955  DOS: 01/26/2020  Location: Office  MRN: [id number]  Setting: Ambulatory outpatient  Referring Provider: [name], MD  Type: Established Patient  Specialty: Interventional Pain Management  PCP: [name], MD  Location: Remote location  Delivery: TeleHealth     Virtual Encounter - Pain Management PROVIDER NOTE: Information contained herein reflects review and annotations entered in association with encounter. Interpretation of such information and data should be left to medically-trained personnel. Information provided to patient can be located elsewhere in the medical record under "Patient Instructions". Document created using STT-dictation technology, any transcriptional errors that may result from process are unintentional.    Contact & Pharmacy Preferred: [phone] Home: [phone] (home) Mobile: [phone] (mobile) E-mail: [email or web address]  [id number] [id number]: [phone]: [phone]  Pre-screening  Samuel Roberts offered "in-person" vs "virtual" encounter. He indicated preferring virtual for this encounter.   Reason COVID-19*   Social distancing based on CDC and AMA recommendations.   I contacted [name] 01/26/2020 via telephone.      I clearly identified myself as FGaspar Cola MD. I verified that I was speaking with the correct person using two identifiers (Name: DBREANDAN PEOPLE and date of birth: 62-Mar-1957.  Consent I sought verbal advanced consent from DCaffie Pintofor virtual visit interactions. I informed Samuel Roberts of possible security and privacy concerns, risks, and limitations associated with providing "not-in-person" medical evaluation and management services. I also informed Mr. Danford of the availability of "in-person" appointments. Finally, I informed  him that there would be a charge for the virtual visit and that he could be  personally, fully or partially, financially responsible for it. Mr. NMcginleyexpressed understanding and agreed to proceed.   Historic Elements   Samuel Roberts a 64y.o. year old, male patient evaluated today after our last contact on 01/05/2020. Samuel Roberts has a past medical history of BPH (benign prostatic hyperplasia) and Colitis, ulcerative ([place]. He also  has a past surgical history that includes Knee arthroscopy and arthrotomy; Tonsillectomy; LEFT HEART CATH AND CORONARY ANGIOGRAPHY (Left, 09/09/2016); and Joint replacement (Right, 02/17/2018). Samuel Roberts a current medication list which includes the following prescription(s): finasteride, mesalamine, pantoprazole, and [START ON 02/16/2020] tramadol. He  reports that he has never smoked. He has never used smokeless tobacco. He reports that he does not drink alcohol and does not use drugs. Mr. NVeneyhas No Known Allergies.   HPI  Today, he is being contacted for a post-procedure assessment.  According to the patient the cervical epidural steroid injections worked extremely well in controlling the pain in the neck, however the trigger point in the lumbar region did not.  Today we spoke about other alternatives including moving on to the diagnostic right-sided lumbar facet block.  He indicated that he is interested in doing that and therefore we will schedule it as soon as possible.  Post-Procedure Evaluation  Procedure (01/05/2020): Therapeutic right lumbar paravertebral trigger point injection #2, no fluoroscopic guidance or IV sedation Pre-procedure pain level: 6/10 Post-procedure: 6/10 No initial benefit, possibly due to rapid discharge after no sedation procedure, without enough time to allow full onset of block.  Sedation: None.  Effectiveness during initial hour after procedure(Ultra-Short Term Relief):   100%.  Local anesthetic used: Long-acting (4-6  hours) Effectiveness: Defined  as any analgesic benefit obtained secondary to the administration of local anesthetics. This carries significant diagnostic value as to the etiological location, or anatomical origin, of the pain. Duration of benefit is expected to coincide with the duration of the local anesthetic used.  Effectiveness during initial 4-6 hours after procedure(Short-Term Relief):   100%.  Long-term benefit: Defined as any relief past the pharmacologic duration of the local anesthetics.  Effectiveness past the initial 6 hours after procedure(Long-Term Relief):   0%.  Current benefits: Defined as benefit that persist at this time.   Analgesia:  Back to baseline. Function: No improvement ROM: No improvement  Pharmacotherapy Assessment  Analgesic: Tramadol 50 mg, 1 tab PO BID (100 mg/day of tramadol) MME/day:53m/day.   Monitoring: [place] PMP: PDMP reviewed during this encounter.       Pharmacotherapy: No side-effects or adverse reactions reported. Compliance: No problems identified. Effectiveness: Clinically acceptable. Plan: Refer to "POC".  UDS:  Summary  Date Value Ref Range Status  11/17/2019 Note  Final    Comment:    ==================================================================== [employer] Select 13 (MW) ==================================================================== Test                             Result       Flag       Units  Drug Present and Declared for Prescription Verification   Tramadol                       >1832        EXPECTED   ng/mg creat   O-Desmethyltramadol            >1832        EXPECTED   ng/mg creat   N-Desmethyltramadol            777          EXPECTED   ng/mg creat    Source of tramadol is a prescription medication. O-desmethyltramadol    and N-desmethyltramadol are expected metabolites of tramadol.  ==================================================================== Test                      Result    Flag   Units      Ref  Range   Creatinine              273              mg/dL      >=20 ==================================================================== Declared Medications:  The flagging and interpretation on this report are based on the  following declared medications.  Unexpected results may arise from  inaccuracies in the declared medications.   **Note: The testing scope of this panel includes these medications:   Tramadol (Ultram)   **Note: The testing scope of this panel does not include the  following reported medications:   Finasteride (Propecia)  Mesalamine  Pantoprazole (Protonix) ==================================================================== For clinical consultation, please call ([phone] ====================================================================     Laboratory Chemistry Profile   Renal Lab Results  Component Value Date   BUN 14 11/25/2019   CREATININE 0.76 11/25/2019   GFRAA >60 11/25/2019   GFRNONAA >60 11/25/2019     Hepatic Lab Results  Component Value Date   AST 27 09/05/2016   ALT 15 (L) 09/05/2016   ALBUMIN 4.1 09/05/2016   ALKPHOS 62 09/05/2016     Electrolytes Lab Results  Component Value Date   NA 136 11/25/2019   K 4.2  11/25/2019   CL 104 11/25/2019   CALCIUM 8.9 11/25/2019   MG 2.4 (H) 06/24/2017     Bone Lab Results  Component Value Date   25OHVITD1 37 06/24/2017   25OHVITD2 2.2 06/24/2017   25OHVITD3 35 06/24/2017     Inflammation (CRP: Acute Phase) (ESR: Chronic Phase) Lab Results  Component Value Date   CRP 2.8 06/24/2017   ESRSEDRATE 11 06/24/2017       Note: Above Lab results reviewed.  Imaging  DG Chest 2 View CLINICAL DATA:  Chest pain  EXAM: CHEST - 2 VIEW  COMPARISON:  09/05/2016  FINDINGS: Cardiac shadow is within normal limits. The lungs are well aerated bilaterally. No focal infiltrate is noted. No sizable effusion is seen. No bony abnormality is noted.  IMPRESSION: No active cardiopulmonary  disease.  Electronically Signed   By: Inez Catalina M.D.   On: 11/25/2019 14:53  Assessment  The primary encounter diagnosis was Chronic pain syndrome. Diagnoses of Chronic low back pain (Fourth Area of Pain) (Right) without sciatica and Lumbar facet joint syndrome (Bilateral) were also pertinent to this visit.  Plan of Care  Problem-specific:  No problem-specific Assessment & Plan notes found for this encounter.  Mr. DESMUND ELMAN has a current medication list which includes the following long-term medication(s): pantoprazole and [START ON 02/16/2020] tramadol.  Pharmacotherapy (Medications Ordered): Meds ordered this encounter  Medications   traMADol (ULTRAM) 50 MG tablet    Sig: Take 1 tablet (50 mg total) by mouth 2 (two) times daily.    Dispense:  60 tablet    Refill:  2    Chronic Pain: STOP Act (Not applicable) Fill 1 day early if closed on refill date. Avoid benzodiazepines within 8 hours of opioids   Orders:  Orders Placed This Encounter  Procedures   LUMBAR FACET(MEDIAL BRANCH NERVE BLOCK) MBNB    Standing Status:   Future    Standing Expiration Date:   02/26/2020    Scheduling Instructions:     Procedure: Lumbar facet block (AKA.: Lumbosacral medial branch nerve block)     Side: Right-sided     Level: L3-4, L4-5, & L5-S1 Facets (L2, L3, L4, L5, & S1 Medial Branch Nerves)     Sedation: Patient's choice.     Timeframe: ASAA    Order Specific Question:   Where will this procedure be performed?    Answer:   [facility]   Follow-up plan:   Return for Procedure (w/ sedation): (R) L-FCT Blk #1.      Interventional management options:  Considering:   Diagnostic left-sided cervical facet nerve block Possible left-sided cervical facet RFA Diagnosticleft suprascapular nerve block Diagnostictrigger point injection Diagnosticright lumbar facet nerve block Diagnostic right lumbar facet RFA Diagnostic Left knee Hyalgan series Diagnostic Right knee  genicular nerve block Possible right knee genicular RFA   Palliative PRN treatment(s):   Diagnostic left-sided CESI #3 (PRN)    Recent Visits Date Type Provider Dept  01/05/20 Procedure visit Milinda Pointer, MD [facility]  11/16/19 Telemedicine Milinda Pointer, MD [facility]  Showing recent visits within past 90 days and meeting all other requirements Today's Visits Date Type Provider Dept  01/26/20 Telemedicine Milinda Pointer, MD [facility]  Showing today's visits and meeting all other requirements Future Appointments Date Type Provider Dept  02/08/20 Appointment Milinda Pointer, MD [facility]  Showing future appointments within next 90 days and meeting all other requirements  I discussed the assessment and treatment plan  with the patient. The patient was provided an opportunity to ask questions and all were answered. The patient agreed with the plan and demonstrated an understanding of the instructions.  Patient advised to call back or seek an in-person evaluation if the symptoms or condition worsens.  Duration of encounter: 12 minutes.  Note by: Gaspar Cola, MD Date: 01/26/2020; Time: 3:54 PM

## 2020-01-26 NOTE — Patient Instructions (Signed)
____________________________________________________________________________________________  Preparing for Procedure with Sedation  Procedure appointments are limited to planned procedures: . No Prescription Refills. . No disability issues will be discussed. . No medication changes will be discussed.  Instructions: . Oral Intake: Do not eat or drink anything for at least 8 hours prior to your procedure. (Exception: Blood Pressure Medication. See below.) . Transportation: Unless otherwise stated by your physician, you may drive yourself after the procedure. . Blood Pressure Medicine: Do not forget to take your blood pressure medicine with a sip of water the morning of the procedure. If your Diastolic (lower reading)is above 100 mmHg, elective cases will be cancelled/rescheduled. . Blood thinners: These will need to be stopped for procedures. Notify our staff if you are taking any blood thinners. Depending on which one you take, there will be specific instructions on how and when to stop it. . Diabetics on insulin: Notify the staff so that you can be scheduled 1st case in the morning. If your diabetes requires high dose insulin, take only  of your normal insulin dose the morning of the procedure and notify the staff that you have done so. . Preventing infections: Shower with an antibacterial soap the morning of your procedure. . Build-up your immune system: Take 1000 mg of Vitamin C with every meal (3 times a day) the day prior to your procedure. Marland Kitchen Antibiotics: Inform the staff if you have a condition or reason that requires you to take antibiotics before dental procedures. . Pregnancy: If you are pregnant, call and cancel the procedure. . Sickness: If you have a cold, fever, or any active infections, call and cancel the procedure. . Arrival: You must be in the facility at least 30 minutes prior to your scheduled procedure. . Children: Do not bring children with you. . Dress appropriately:  Bring dark clothing that you would not mind if they get stained. . Valuables: Do not bring any jewelry or valuables.  Reasons to call and reschedule or cancel your procedure: (Following these recommendations will minimize the risk of a serious complication.) . Surgeries: Avoid having procedures within 2 weeks of any surgery. (Avoid for 2 weeks before or after any surgery). . Flu Shots: Avoid having procedures within 2 weeks of a flu shots or . (Avoid for 2 weeks before or after immunizations). . Barium: Avoid having a procedure within 7-10 days after having had a radiological study involving the use of radiological contrast. (Myelograms, Barium swallow or enema study). . Heart attacks: Avoid any elective procedures or surgeries for the initial 6 months after a "Myocardial Infarction" (Heart Attack). . Blood thinners: It is imperative that you stop these medications before procedures. Let us know if you if you take any blood thinner.  . Infection: Avoid procedures during or within two weeks of an infection (including chest colds or gastrointestinal problems). Symptoms associated with infections include: Localized redness, fever, chills, night sweats or profuse sweating, burning sensation when voiding, cough, congestion, stuffiness, runny nose, sore throat, diarrhea, nausea, vomiting, cold or Flu symptoms, recent or current infections. It is specially important if the infection is over the area that we intend to treat. Marland Kitchen Heart and lung problems: Symptoms that may suggest an active cardiopulmonary problem include: cough, chest pain, breathing difficulties or shortness of breath, dizziness, ankle swelling, uncontrolled high or unusually low blood pressure, and/or palpitations. If you are experiencing any of these symptoms, cancel your procedure and contact your primary care physician for an evaluation.  Remember:  Regular Business hours are:  Monday to Thursday 8:00 AM to 4:00 PM  Provider's  Schedule: Samuel Pointer, MD:  Procedure days: Tuesday and Thursday 7:30 AM to 4:00 PM  Samuel Santa, MD:  Procedure days: Monday and Wednesday 7:30 AM to 4:00 PM ____________________________________________________________________________________________

## 2020-02-08 ENCOUNTER — Encounter: Payer: BC Managed Care – PPO | Admitting: Pain Medicine

## 2020-02-09 ENCOUNTER — Ambulatory Visit (HOSPITAL_BASED_OUTPATIENT_CLINIC_OR_DEPARTMENT_OTHER): Payer: BC Managed Care – PPO | Admitting: Pain Medicine

## 2020-02-09 ENCOUNTER — Other Ambulatory Visit: Payer: Self-pay

## 2020-02-09 ENCOUNTER — Encounter: Payer: Self-pay | Admitting: Pain Medicine

## 2020-02-09 ENCOUNTER — Ambulatory Visit
Admission: RE | Admit: 2020-02-09 | Discharge: 2020-02-09 | Disposition: A | Discharge status: Home / Self Care | Payer: BC Managed Care – PPO | Source: Ambulatory Visit | Attending: Pain Medicine | Admitting: Pain Medicine

## 2020-02-09 VITALS — BP 118/80 | HR 72 | Temp 97.7°F | Resp 16 | Ht 70.0 in | Wt 200.0 lb

## 2020-02-09 DIAGNOSIS — M47816 Spondylosis without myelopathy or radiculopathy, lumbar region: Secondary | ICD-10-CM

## 2020-02-09 DIAGNOSIS — M47817 Spondylosis without myelopathy or radiculopathy, lumbosacral region: Secondary | ICD-10-CM | POA: Insufficient documentation

## 2020-02-09 DIAGNOSIS — M545 Low back pain, unspecified: Secondary | ICD-10-CM | POA: Diagnosis present

## 2020-02-09 DIAGNOSIS — M5136 Other intervertebral disc degeneration, lumbar region: Secondary | ICD-10-CM | POA: Insufficient documentation

## 2020-02-09 DIAGNOSIS — G8929 Other chronic pain: Secondary | ICD-10-CM | POA: Insufficient documentation

## 2020-02-09 MED ORDER — ROPIVACAINE HCL 2 MG/ML IJ SOLN
9.0000 mL | Freq: Once | INTRAMUSCULAR | Status: AC
  Administered 2020-02-09: 9 mL via PERINEURAL
  Filled 2020-02-09: qty 10

## 2020-02-09 MED ORDER — MIDAZOLAM HCL 5 MG/5ML IJ SOLN
1.0000 mg | INTRAMUSCULAR | Status: DC | PRN
  Administered 2020-02-09: 3 mg via INTRAVENOUS
  Filled 2020-02-09: qty 5

## 2020-02-09 MED ORDER — TRIAMCINOLONE ACETONIDE 40 MG/ML IJ SUSP
40.0000 mg | Freq: Once | INTRAMUSCULAR | Status: DC
  Filled 2020-02-09: qty 1

## 2020-02-09 MED ORDER — LIDOCAINE HCL 2 % IJ SOLN
20.0000 mL | Freq: Once | INTRAMUSCULAR | Status: AC
  Administered 2020-02-09: 400 mg
  Filled 2020-02-09: qty 40

## 2020-02-09 MED ORDER — FENTANYL CITRATE (PF) 100 MCG/2ML IJ SOLN
25.0000 ug | INTRAMUSCULAR | Status: DC | PRN
  Administered 2020-02-09: 100 ug via INTRAVENOUS
  Filled 2020-02-09: qty 2

## 2020-02-09 MED ORDER — LACTATED RINGERS IV SOLN
1000.0000 mL | Freq: Once | INTRAVENOUS | Status: AC
  Administered 2020-02-09: 1000 mL via INTRAVENOUS

## 2020-02-09 NOTE — Progress Notes (Signed)
Safety precautions to be maintained throughout the outpatient stay will include: orient to surroundings, keep bed in low position, maintain call bell within reach at all times, provide assistance with transfer out of bed and ambulation.  

## 2020-02-09 NOTE — Patient Instructions (Signed)

## 2020-02-09 NOTE — Progress Notes (Signed)
PROVIDER NOTE: Information contained herein reflects review and annotations entered in association with encounter. Interpretation of such information and data should be left to medically-trained personnel. Information provided to patient can be located elsewhere in the medical record under "Patient Instructions". Document created using STT-dictation technology, any transcriptional errors that may result from process are unintentional.    Patient: Samuel Roberts  Service Category: Procedure  Provider: Gaspar Cola, MD  DOB: [birth year]  DOS: 02/09/2020  Location: [facility]  MRN: [id number]  Setting: Ambulatory - outpatient  Referring Provider: [name], MD  Type: Established Patient  Specialty: Interventional Pain Management  PCP: [name], MD   Primary Reason for Visit: Interventional Pain Management Treatment. CC: Back Pain (lumbar right ), Shoulder Pain (left ), and Neck Pain (left )  Procedure:          Anesthesia, Analgesia, Anxiolysis:  Type: Lumbar Facet, Medial Branch Block(s) #1  Primary Purpose: Diagnostic Region: Posterolateral Lumbosacral Spine Level: L2, L3, L4, L5, & S1 Medial Branch Level(s). Injecting these levels blocks the L3-4, L4-5, and L5-S1 lumbar facet joints. Laterality: Right  Type: Moderate (Conscious) Sedation combined with Local Anesthesia Indication(s): Analgesia and Anxiety Route: Intravenous (IV) IV Access: Secured Sedation: Meaningful verbal contact was maintained at all times during the procedure  Local Anesthetic: Lidocaine 1-2%  Position: Prone   Indications: 1. Lumbar facet joint syndrome (Bilateral)   2. Spondylosis without myelopathy or radiculopathy, lumbosacral region   3. DDD (degenerative disc disease), lumbar   4. Chronic low back pain (Fourth Area of Pain) (Right) without sciatica    Pain Score: Pre-procedure: 7 /10 Post-procedure: 0-No pain/10   Pre-op Assessment:  Samuel Roberts is a 64 y.o. (year  old), male patient, seen today for interventional treatment. He  has a past surgical history that includes Knee arthroscopy and arthrotomy; Tonsillectomy; LEFT HEART CATH AND CORONARY ANGIOGRAPHY (Left, 09/09/2016); and Joint replacement (Right, 02/17/2018). Samuel Roberts has a current medication list which includes the following prescription(s): finasteride, mesalamine, pantoprazole, and [START ON 02/16/2020] tramadol, and the following Facility-Administered Medications: fentanyl, midazolam, and triamcinolone acetonide. His primarily concern today is the Back Pain (lumbar right ), Shoulder Pain (left ), and Neck Pain (left )  Initial Vital Signs:  Pulse/HCG Rate: 72ECG Heart Rate: 63 Temp: 98.1 F (36.7 C) Resp: 16 BP: (!) 149/91 SpO2: 97 %  BMI: Estimated body mass index is 28.7 kg/m as calculated from the following:   Height as of this encounter: 5' 10"  (1.778 m).   Weight as of this encounter: 200 lb (90.7 kg).  Risk Assessment: Allergies: Reviewed. He has No Known Allergies.  Allergy Precautions: None required Coagulopathies: Reviewed. None identified.  Blood-thinner therapy: None at this time Active Infection(s): Reviewed. None identified. Samuel Roberts is afebrile  Site Confirmation: Samuel Roberts was asked to confirm the procedure and laterality before marking the site Procedure checklist: Completed Consent: Before the procedure and under the influence of no sedative(s), amnesic(s), or anxiolytics, the patient was informed of the treatment options, risks and possible complications. To fulfill our ethical and legal obligations, as recommended by the American Medical Association's Code of Ethics, I have informed the patient of my clinical impression; the nature and purpose of the treatment or procedure; the risks, benefits, and possible complications of the intervention; the alternatives, including doing nothing; the risk(s) and benefit(s) of the alternative treatment(s) or procedure(s); and the  risk(s) and benefit(s) of doing nothing. The patient was provided information about the general risks and  possible complications associated with the procedure. These may include, but are not limited to: failure to achieve desired goals, infection, bleeding, organ or nerve damage, allergic reactions, paralysis, and death. In addition, the patient was informed of those risks and complications associated to Spine-related procedures, such as failure to decrease pain; infection (i.e.: Meningitis, epidural or intraspinal abscess); bleeding (i.e.: epidural hematoma, subarachnoid hemorrhage, or any other type of intraspinal or peri-dural bleeding); organ or nerve damage (i.e.: Any type of peripheral nerve, nerve root, or spinal cord injury) with subsequent damage to sensory, motor, and/or autonomic systems, resulting in permanent pain, numbness, and/or weakness of one or several areas of the body; allergic reactions; (i.e.: anaphylactic reaction); and/or death. Furthermore, the patient was informed of those risks and complications associated with the medications. These include, but are not limited to: allergic reactions (i.e.: anaphylactic or anaphylactoid reaction(s)); adrenal axis suppression; blood sugar elevation that in diabetics may result in ketoacidosis or comma; water retention that in patients with history of congestive heart failure may result in shortness of breath, pulmonary edema, and decompensation with resultant heart failure; weight gain; swelling or edema; medication-induced neural toxicity; particulate matter embolism and blood vessel occlusion with resultant organ, and/or nervous system infarction; and/or aseptic necrosis of one or more joints. Finally, the patient was informed that Medicine is not an exact science; therefore, there is also the possibility of unforeseen or unpredictable risks and/or possible complications that may result in a catastrophic outcome. The patient indicated having  understood very clearly. We have given the patient no guarantees and we have made no promises. Enough time was given to the patient to ask questions, all of which were answered to the patient's satisfaction. Samuel Roberts has indicated that he wanted to continue with the procedure. Attestation: I, the ordering provider, attest that I have discussed with the patient the benefits, risks, side-effects, alternatives, likelihood of achieving goals, and potential problems during recovery for the procedure that I have provided informed consent. Date  Time: 02/09/2020 11:58 AM  Pre-Procedure Preparation:  Monitoring: As per clinic protocol. Respiration, ETCO2, SpO2, BP, heart rate and rhythm monitor placed and checked for adequate function Safety Precautions: Patient was assessed for positional comfort and pressure points before starting the procedure. Time-out: I initiated and conducted the "Time-out" before starting the procedure, as per protocol. The patient was asked to participate by confirming the accuracy of the "Time Out" information. Verification of the correct person, site, and procedure were performed and confirmed by me, the nursing staff, and the patient. "Time-out" conducted as per Joint Commission's Universal Protocol (UP.01.01.01). Time: 1225  Description of Procedure:          Laterality: Right Levels:  L2, L3, L4, L5, & S1 Medial Branch Level(s) Area Prepped: Posterior Lumbosacral Region DuraPrep (Iodine Povacrylex [0.7% available iodine] and Isopropyl Alcohol, 74% w/w) Safety Precautions: Aspiration looking for blood return was conducted prior to all injections. At no point did we inject any substances, as a needle was being advanced. Before injecting, the patient was told to immediately notify me if he was experiencing any new onset of "ringing in the ears, or metallic taste in the mouth". No attempts were made at seeking any paresthesias. Safe injection practices and needle disposal  techniques used. Medications properly checked for expiration dates. SDV (single dose vial) medications used. After the completion of the procedure, all disposable equipment used was discarded in the proper designated medical waste containers. Local Anesthesia: Protocol guidelines were followed. The patient was positioned over  the fluoroscopy table. The area was prepped in the usual manner. The time-out was completed. The target area was identified using fluoroscopy. A 12-in long, straight, sterile hemostat was used with fluoroscopic guidance to locate the targets for each level blocked. Once located, the skin was marked with an approved surgical skin marker. Once all sites were marked, the skin (epidermis, dermis, and hypodermis), as well as deeper tissues (fat, connective tissue and muscle) were infiltrated with a small amount of a short-acting local anesthetic, loaded on a 10cc syringe with a 25G, 1.5-in  Needle. An appropriate amount of time was allowed for local anesthetics to take effect before proceeding to the next step. Local Anesthetic: Lidocaine 2.0% The unused portion of the local anesthetic was discarded in the proper designated containers. Technical explanation of process:  L2 Medial Branch Nerve Block (MBB): The target area for the L2 medial branch is at the junction of the postero-lateral aspect of the superior articular process and the superior, posterior, and medial edge of the transverse process of L3. Under fluoroscopic guidance, a Quincke needle was inserted until contact was made with os over the superior postero-lateral aspect of the pedicular shadow (target area). After negative aspiration for blood, 0.5 mL of the nerve block solution was injected without difficulty or complication. The needle was removed intact. L3 Medial Branch Nerve Block (MBB): The target area for the L3 medial branch is at the junction of the postero-lateral aspect of the superior articular process and the superior,  posterior, and medial edge of the transverse process of L4. Under fluoroscopic guidance, a Quincke needle was inserted until contact was made with os over the superior postero-lateral aspect of the pedicular shadow (target area). After negative aspiration for blood, 0.5 mL of the nerve block solution was injected without difficulty or complication. The needle was removed intact. L4 Medial Branch Nerve Block (MBB): The target area for the L4 medial branch is at the junction of the postero-lateral aspect of the superior articular process and the superior, posterior, and medial edge of the transverse process of L5. Under fluoroscopic guidance, a Quincke needle was inserted until contact was made with os over the superior postero-lateral aspect of the pedicular shadow (target area). After negative aspiration for blood, 0.5 mL of the nerve block solution was injected without difficulty or complication. The needle was removed intact. L5 Medial Branch Nerve Block (MBB): The target area for the L5 medial branch is at the junction of the postero-lateral aspect of the superior articular process and the superior, posterior, and medial edge of the sacral ala. Under fluoroscopic guidance, a Quincke needle was inserted until contact was made with os over the superior postero-lateral aspect of the pedicular shadow (target area). After negative aspiration for blood, 0.5 mL of the nerve block solution was injected without difficulty or complication. The needle was removed intact. S1 Medial Branch Nerve Block (MBB): The target area for the S1 medial branch is at the posterior and inferior 6 o'clock position of the L5-S1 facet joint. Under fluoroscopic guidance, the Quincke needle inserted for the L5 MBB was redirected until contact was made with os over the inferior and postero aspect of the sacrum, at the 6 o' clock position under the L5-S1 facet joint (Target area). After negative aspiration for blood, 0.5 mL of the nerve block  solution was injected without difficulty or complication. The needle was removed intact.  Nerve block solution: 0.2% PF-Ropivacaine + Triamcinolone (40 mg/mL) diluted to a final concentration of 4 mg  of Triamcinolone/mL of Ropivacaine The unused portion of the solution was discarded in the proper designated containers. Procedural Needles: 22-gauge, 3.5-inch, Quincke needles used for all levels.  Once the entire procedure was completed, the treated area was cleaned, making sure to leave some of the prepping solution back to take advantage of its long term bactericidal properties.   Illustration of the posterior view of the lumbar spine and the posterior neural structures. Laminae of L2 through S1 are labeled. DPRL5, dorsal primary ramus of L5; DPRS1, dorsal primary ramus of S1; DPR3, dorsal primary ramus of L3; FJ, facet (zygapophyseal) joint L3-L4; I, inferior articular process of L4; LB1, lateral branch of dorsal primary ramus of L1; IAB, inferior articular branches from L3 medial branch (supplies L4-L5 facet joint); IBP, intermediate branch plexus; MB3, medial branch of dorsal primary ramus of L3; NR3, third lumbar nerve root; S, superior articular process of L5; SAB, superior articular branches from L4 (supplies L4-5 facet joint also); TP3, transverse process of L3.  Vitals:   02/09/20 1147 02/09/20 1218 02/09/20 1223 02/09/20 1230  BP: (!) 149/91 (!) 169/101 (!) 136/100 (!) 137/99  Pulse: 72     Resp: 16 18 18 18   Temp: 98.1 F (36.7 C)     TempSrc: Temporal     SpO2: 97% 96% 93% 95%  Weight: 200 lb (90.7 kg)     Height: 5' 10"  (1.778 m)        Start Time: 1225 hrs. End Time: 1230 hrs.  Imaging Guidance (Spinal):          Type of Imaging Technique: Fluoroscopy Guidance (Spinal) Indication(s): Assistance in needle guidance and placement for procedures requiring needle placement in or near specific anatomical locations not easily accessible without such assistance. Exposure Time:  Please see nurses notes. Contrast: None used. Fluoroscopic Guidance: I was personally present during the use of fluoroscopy. "Tunnel Vision Technique" used to obtain the best possible view of the target area. Parallax error corrected before commencing the procedure. "Direction-depth-direction" technique used to introduce the needle under continuous pulsed fluoroscopy. Once target was reached, antero-posterior, oblique, and lateral fluoroscopic projection used confirm needle placement in all planes. Images permanently stored in EMR.  Interpretation: No contrast injected. I personally interpreted the imaging intraoperatively. Adequate needle placement confirmed in multiple planes. Permanent images saved into the patient's record.  Antibiotic Prophylaxis:   Anti-infectives (From admission, onward)   None     Indication(s): None identified  Post-operative Assessment:  Post-procedure Vital Signs:  Pulse/HCG Rate: 7262 Temp: 98.1 F (36.7 C) Resp: 18 BP: (!) 137/99 SpO2: 95 %  EBL: None  Complications: No immediate post-treatment complications observed by team, or reported by patient.  Note: The patient tolerated the entire procedure well. A repeat set of vitals were taken after the procedure and the patient was kept under observation following institutional policy, for this type of procedure. Post-procedural neurological assessment was performed, showing return to baseline, prior to discharge. The patient was provided with post-procedure discharge instructions, including a section on how to identify potential problems. Should any problems arise concerning this procedure, the patient was given instructions to immediately contact us, at any time, without hesitation. In any case, we plan to contact the patient by telephone for a follow-up status report regarding this interventional procedure.  Comments:  No additional relevant information.  Plan of Care  Orders:  Orders Placed This Encounter   Procedures  . LUMBAR FACET(MEDIAL BRANCH NERVE BLOCK) MBNB    Scheduling Instructions:  Procedure: Lumbar facet block (AKA.: Lumbosacral medial branch nerve block)     Side: Left-sided     Level: L3-4, L4-5, & L5-S1 Facets (L2, L3, L4, L5, & S1 Medial Branch Nerves)     Sedation: Patient's choice.     Timeframe: Today    Order Specific Question:   Where will this procedure be performed?    Answer:   [facility]  . [facility] C-ARM 1-60 MIN NO REPORT    Intraoperative interpretation by procedural physician at [place].    Standing Status:   Standing    Number of Occurrences:   1    Order Specific Question:   Reason for exam:    Answer:   Assistance in needle guidance and placement for procedures requiring needle placement in or near specific anatomical locations not easily accessible without such assistance.  . Informed Consent Details: Physician/Practitioner Attestation; Transcribe to consent form and obtain patient signature    Nursing Order: Transcribe to consent form and obtain patient signature. Note: Always confirm laterality of pain with Samuel Roberts, before procedure.    Order Specific Question:   Physician/Practitioner attestation of informed consent for procedure/surgical case    Answer:   I, the physician/practitioner, attest that I have discussed with the patient the benefits, risks, side effects, alternatives, likelihood of achieving goals and potential problems during recovery for the procedure that I have provided informed consent.    Order Specific Question:   Procedure    Answer:   Lumbar Facet Block  under fluoroscopic guidance    Order Specific Question:   Physician/Practitioner performing the procedure    Answer:   Marchella Hibbard A. Dossie Arbour MD    Order Specific Question:   Indication/Reason    Answer:   Low Back Pain, with our without leg pain, due to Facet Joint Arthralgia (Joint Pain) Spondylosis (Arthritis of the Spine), without myelopathy or  radiculopathy (Nerve Damage).  . Provide equipment / supplies at bedside    "Block Tray" (Disposable  single use) Needle type: SpinalSpinal Amount/quantity: 4 Size: Regular (3-3.5-inch) Gauge: 22G    Standing Status:   Standing    Number of Occurrences:   1    Order Specific Question:   Specify    Answer:   Block Tray   Chronic Opioid Analgesic:  Tramadol 50 mg, 1 tab PO BID (100 mg/day of tramadol) MME/day:42m/day.   Medications ordered for procedure: Meds ordered this encounter  Medications  . lidocaine (XYLOCAINE) 2 % (with pres) injection 400 mg  . lactated ringers infusion 1,000 mL  . midazolam (VERSED) 5 MG/5ML injection 1-2 mg    Make sure Flumazenil is available in the pyxis when using this medication. If oversedation occurs, administer 0.2 mg IV over 15 sec. If after 45 sec no response, administer 0.2 mg again over 1 min; may repeat at 1 min intervals; not to exceed 4 doses (1 mg)  . fentaNYL (SUBLIMAZE) injection 25-50 mcg    Make sure Narcan is available in the pyxis when using this medication. In the event of respiratory depression (RR< 8/min): Titrate NARCAN (naloxone) in increments of 0.1 to 0.2 mg IV at 2-3 minute intervals, until desired degree of reversal.  . ropivacaine (PF) 2 mg/mL (0.2%) (NAROPIN) injection 9 mL  . triamcinolone acetonide (KENALOG-40) injection 40 mg   Medications administered: We administered lidocaine, lactated ringers, midazolam, fentaNYL, and ropivacaine (PF) 2 mg/mL (0.2%).  See the medical record for exact dosing, route, and time of administration.  Follow-up plan:   Return in about 2 weeks (around 02/23/2020) for (VV), (PP) Follow-up.       Interventional management options:  Considering:   Diagnostic left-sided cervical facet nerve block Possible left-sided cervical facet RFA Diagnosticleft suprascapular nerve block Diagnostictrigger point injection Diagnosticright lumbar facet nerve block Diagnostic right lumbar facet  RFA Diagnostic Left knee Hyalgan series Diagnostic Right knee genicular nerve block Possible right knee genicular RFA   Palliative PRN treatment(s):   Diagnostic left-sided CESI #3 (PRN)     Recent Visits Date Type Provider Dept  01/26/20 Telemedicine Milinda Pointer, MD [facility]  01/05/20 Procedure visit Milinda Pointer, MD [facility]  11/16/19 Telemedicine Milinda Pointer, MD [facility]  Showing recent visits within past 90 days and meeting all other requirements Today's Visits Date Type Provider Dept  02/09/20 Procedure visit Milinda Pointer, MD [facility]  Showing today's visits and meeting all other requirements Future Appointments Date Type Provider Dept  05/07/20 Appointment Milinda Pointer, MD [facility]  Showing future appointments within next 90 days and meeting all other requirements  Disposition: Discharge home  Discharge (Date  Time): 02/09/2020; 1305 hrs.   Primary Care Physician: [name], MD Location: [facility] Note by: Gaspar Cola, MD Date: 02/09/2020; Time: 12:43 PM  Disclaimer:  Medicine is not an Chief Strategy Officer. The only guarantee in medicine is that nothing is guaranteed. It is important to note that the decision to proceed with this intervention was based on the information collected from the patient. The Data and conclusions were drawn from the patient's questionnaire, the interview, and the physical examination. Because the information was provided in large part by the patient, it cannot be guaranteed that it has not been purposely or unconsciously manipulated. Every effort has been made to obtain as much relevant data as possible for this evaluation. It is important to note that the conclusions that lead to this procedure are derived in large part from the available data. Always take into account that the treatment will also be dependent  on availability of resources and existing treatment guidelines, considered by other Pain Management Practitioners as being common knowledge and practice, at the time of the intervention. For Medico-Legal purposes, it is also important to point out that variation in procedural techniques and pharmacological choices are the acceptable norm. The indications, contraindications, technique, and results of the above procedure should only be interpreted and judged by a Board-Certified Interventional Pain Specialist with extensive familiarity and expertise in the same exact procedure and technique.

## 2020-02-10 ENCOUNTER — Telehealth: Payer: Self-pay

## 2020-02-10 NOTE — Telephone Encounter (Signed)
Post procedure phone call.  Voicemail has not been set up and unable to leave a message.

## 2020-02-21 NOTE — Progress Notes (Signed)
Unsuccessful attempt to contact patient for Virtual Visit (Pain Management Telehealth)   Patient provided contact information:  [phone] (home); [phone] (mobile); (Preferred) [phone] [email or web address]   Pre-screening:  Our staff was unsuccessful in contacting Mr. Eddins using the above provided information.   I unsuccessfully attempted to make contact with Caffie Pinto on 02/23/2020 via telephone. I was unable to complete the virtual encounter due to the phone ringing once and then dropping the call. I was unable to leave a message.  Post-Procedure Evaluation  Procedure (02/09/2020): Diagnostic right lumbar facet block #1 under fluoroscopic guidance and IV sedation Pre-procedure pain level: 7/10 Post-procedure: 0/10 (100% relief)  Sedation: Sedation provided.   Pharmacotherapy Assessment  Analgesic: Tramadol 50 mg, 1 tab PO BID (100 mg/day of tramadol) MME/day:7m/day.   Follow-up plan:   Reschedule Visit.     Interventional management options:  Considering:   Diagnostic left-sided cervical facet nerve block Possible left-sided cervical facet RFA Diagnosticleft suprascapular nerve block Diagnostictrigger point injection Diagnosticright lumbar facet nerve block Diagnostic right lumbar facet RFA Diagnostic Left knee Hyalgan series Diagnostic Right knee genicular nerve block Possible right knee genicular RFA   Palliative PRN treatment(s):   Diagnostic left-sided CESI #3 (PRN)      Recent Visits Date Type Provider Dept  02/09/20 Procedure visit NMilinda Pointer MD [facility]  01/26/20 Telemedicine NMilinda Pointer MD [facility]  01/05/20 Procedure visit NMilinda Pointer MD [facility]  Showing recent visits within past 90 days and meeting all other requirements Today's Visits Date Type Provider Dept  02/23/20 Telemedicine NMilinda Pointer MD [facility]  Showing today's visits and meeting  all other requirements Future Appointments Date Type Provider Dept  05/07/20 Appointment NMilinda Pointer MD [facility]  Showing future appointments within next 90 days and meeting all other requirements   Note by: FGaspar Cola MD Date: 02/23/2020; Time: 4:04 PM

## 2020-02-22 ENCOUNTER — Telehealth: Payer: Self-pay | Admitting: *Deleted

## 2020-02-22 NOTE — Telephone Encounter (Signed)
Attempted to reach patient, no answer and no voicemail.  Work number is no longer working. This was in regards to VV on 02/23/20.

## 2020-02-23 ENCOUNTER — Other Ambulatory Visit: Payer: Self-pay

## 2020-02-23 ENCOUNTER — Telehealth: Payer: Self-pay

## 2020-02-23 ENCOUNTER — Ambulatory Visit: Payer: BC Managed Care – PPO | Attending: Pain Medicine | Admitting: Pain Medicine

## 2020-02-23 DIAGNOSIS — M25512 Pain in left shoulder: Secondary | ICD-10-CM

## 2020-02-23 DIAGNOSIS — M545 Low back pain, unspecified: Secondary | ICD-10-CM

## 2020-02-23 DIAGNOSIS — M47816 Spondylosis without myelopathy or radiculopathy, lumbar region: Secondary | ICD-10-CM

## 2020-02-23 DIAGNOSIS — M542 Cervicalgia: Secondary | ICD-10-CM

## 2020-02-23 DIAGNOSIS — G8929 Other chronic pain: Secondary | ICD-10-CM

## 2020-02-23 DIAGNOSIS — G894 Chronic pain syndrome: Secondary | ICD-10-CM

## 2020-02-23 NOTE — Telephone Encounter (Signed)
Attempted to call.  Voicemail has not been set up. Unable to leave message.

## 2020-02-23 NOTE — Telephone Encounter (Signed)
Attempted to call patient.  Voicemail has not been set up yet. Unable to leave message.

## 2020-03-05 ENCOUNTER — Telehealth: Payer: Self-pay | Admitting: *Deleted

## 2020-03-05 NOTE — Telephone Encounter (Signed)
Attempted to call [pateint for pre- VV work up.Called both numbers in his chart. No way to leave voice mails at either one.

## 2020-03-06 ENCOUNTER — Ambulatory Visit: Payer: BC Managed Care – PPO | Attending: Pain Medicine | Admitting: Pain Medicine

## 2020-03-06 ENCOUNTER — Other Ambulatory Visit: Payer: Self-pay

## 2020-03-06 DIAGNOSIS — M542 Cervicalgia: Secondary | ICD-10-CM

## 2020-03-06 DIAGNOSIS — M25562 Pain in left knee: Secondary | ICD-10-CM

## 2020-03-06 DIAGNOSIS — G894 Chronic pain syndrome: Secondary | ICD-10-CM

## 2020-03-06 DIAGNOSIS — Z79899 Other long term (current) drug therapy: Secondary | ICD-10-CM

## 2020-03-06 DIAGNOSIS — M545 Low back pain, unspecified: Secondary | ICD-10-CM

## 2020-03-06 DIAGNOSIS — M25561 Pain in right knee: Secondary | ICD-10-CM

## 2020-03-06 DIAGNOSIS — M25512 Pain in left shoulder: Secondary | ICD-10-CM

## 2020-03-06 DIAGNOSIS — M5481 Occipital neuralgia: Secondary | ICD-10-CM

## 2020-03-06 DIAGNOSIS — M47816 Spondylosis without myelopathy or radiculopathy, lumbar region: Secondary | ICD-10-CM

## 2020-03-06 DIAGNOSIS — G8929 Other chronic pain: Secondary | ICD-10-CM

## 2020-03-06 NOTE — Progress Notes (Signed)
Patient: Samuel Roberts  Service Category: E/M  Provider: Gaspar Cola, MD  DOB: June 02, 1955  DOS: 03/06/2020  Location: Office  MRN: [id number]  Setting: Ambulatory outpatient  Referring Provider: [name], MD  Type: Established Patient  Specialty: Interventional Pain Management  PCP: [name], MD  Location: Remote location  Delivery: TeleHealth     Virtual Encounter - Pain Management PROVIDER NOTE: Information contained herein reflects review and annotations entered in association with encounter. Interpretation of such information and data should be left to medically-trained personnel. Information provided to patient can be located elsewhere in the medical record under "Patient Instructions". Document created using STT-dictation technology, any transcriptional errors that may result from process are unintentional.    Contact & Pharmacy Preferred: [phone] Home: [phone] (home) Mobile: [phone] (mobile) E-mail: [email or web address]  [id number] [place] [address] [id number]: [phone]: [phone]  Pre-screening  Mr. Tumbleson offered "in-person" vs "virtual" encounter. He indicated preferring virtual for this encounter.   Reason COVID-19*  Social distancing based on CDC and AMA recommendations.   I contacted [name] 03/06/2020 via telephone.      I clearly identified myself as FGaspar Cola MD. I verified that I was speaking with the correct person using two identifiers (Name: DANUSH WIEDEMAN and date of birth: [birth year].  Consent I sought verbal advanced consent from DCaffie Pintofor virtual visit interactions. I informed Mr. Pavlik of possible security and privacy concerns, risks, and limitations associated with providing "not-in-person" medical evaluation and management services. I also informed Mr. Hellen of the availability of "in-person" appointments. Finally, I  informed him that there would be a charge for the virtual visit and that he could be  personally, fully or partially, financially responsible for it. Mr. NShinerexpressed understanding and agreed to proceed.   Historic Elements   Mr. DJESS TONEYis a 64y.o. year old, male patient evaluated today after our last contact on 02/09/2020. Mr. NFelch has a past medical history of BPH (benign prostatic hyperplasia) and Colitis, ulcerative ([place]. He also  has a past surgical history that includes Knee arthroscopy and arthrotomy; Tonsillectomy; LEFT HEART CATH AND CORONARY ANGIOGRAPHY (Left, 09/09/2016); and Joint replacement (Right, 02/17/2018). Mr. NTibbshas a current medication list which includes the following prescription(s): finasteride, mesalamine, pantoprazole, and tramadol. He  reports that he has never smoked. He has never used smokeless tobacco. He reports that he does not drink alcohol and does not use drugs. Mr. NWorldshas No Known Allergies.   HPI  Today, he is being contacted for a post-procedure assessment.  The patient indicates having attained 100% relief of the pain for the duration of the local anesthetic.  Once the local anesthetic wore off after 4 to 5 hours, then he began to experience the return of the pain which actually got worse for approximately 2 days.  After that, he began to experience a second wave of relief from the steroids and at this point he refers having 90 to 100% ongoing relief of the right lower back.  Since this was the first diagnostic injection, my interpretation of this is that it is very likely that the pain that he has been experiencing on the right lower back which we thought was a trigger point, it is likely to be coming from the lumbar facets.  As such, if and when it returns, we can do a second diagnostic injection  and if again we get 100% relief of the pain for the duration of local anesthetic, but no long-term benefit, then I would consider him to be a good  candidate for radiofrequency ablation.  Today we also talked about his left shoulder pain and neck pain which previously he had mentioned that he wanted for me to inject however, he has been doing some physical therapy and he believes that it is improving.  Therefore, he wants to continue with physical therapy for a while to see where it takes him.  If it does not get any better, he indicated that he would contact me to go over some options.  Post-Procedure Evaluation  Procedure (02/09/2020): Diagnostic right lumbar facet block #1 under fluoroscopic guidance and IV sedation Pre-procedure pain level: 7/10 Post-procedure: 0/10 (100% relief)  Sedation: Sedation provided.  Effectiveness during initial hour after procedure(Ultra-Short Term Relief):  100%.  Local anesthetic used: Long-acting (4-6 hours) Effectiveness: Defined as any analgesic benefit obtained secondary to the administration of local anesthetics. This carries significant diagnostic value as to the etiological location, or anatomical origin, of the pain. Duration of benefit is expected to coincide with the duration of the local anesthetic used.  Effectiveness during initial 4-6 hours after procedure(Short-Term Relief):  100%.  Long-term benefit: Defined as any relief past the pharmacologic duration of the local anesthetics.  Effectiveness past the initial 6 hours after procedure(Long-Term Relief):   For the first couple days after the local anesthetic wore off, he experienced more discomfort than usual.  However, after 2 days he began to experience the benefit of the steroid and at this point he refers having a 90 to 100% relief of the pain on the right lower back.  Current benefits: Defined as benefit that persist at this time.   Analgesia:  90-100% better Function: Mr. Jarrard reports improvement in function ROM: Mr. Kessenich reports improvement in ROM  Pharmacotherapy Assessment  Analgesic: Tramadol 50 mg, 1 tab PO BID (100 mg/day  of tramadol) MME/day:48m/day.   Monitoring: [place] PMP: PDMP not reviewed this encounter.       Pharmacotherapy: No side-effects or adverse reactions reported. Compliance: No problems identified. Effectiveness: Clinically acceptable. Plan: Refer to "POC".  UDS:  Summary  Date Value Ref Range Status  11/17/2019 Note  Final    Comment:    ==================================================================== Tox[employer] Select 13 (MW) ==================================================================== Test                             Result       Flag       Units  Drug Present and Declared for Prescription Verification   Tramadol                       >1832        EXPECTED   ng/mg creat   O-Desmethyltramadol            >1832        EXPECTED   ng/mg creat   N-Desmethyltramadol            777          EXPECTED   ng/mg creat    Source of tramadol is a prescription medication. O-desmethyltramadol    and N-desmethyltramadol are expected metabolites of tramadol.  ==================================================================== Test                      Result  Flag   Units      Ref Range   Creatinine              273              mg/dL      >=20 ==================================================================== Declared Medications:  The flagging and interpretation on this report are based on the  following declared medications.  Unexpected results may arise from  inaccuracies in the declared medications.   **Note: The testing scope of this panel includes these medications:   Tramadol (Ultram)   **Note: The testing scope of this panel does not include the  following reported medications:   Finasteride (Propecia)  Mesalamine  Pantoprazole (Protonix) ==================================================================== For clinical consultation, please call [phone]. ====================================================================     Laboratory Chemistry  Profile   Renal Lab Results  Component Value Date   BUN 14 11/25/2019   CREATININE 0.76 11/25/2019   GFRAA >60 11/25/2019   GFRNONAA >60 11/25/2019     Hepatic Lab Results  Component Value Date   AST 27 09/05/2016   ALT 15 (L) 09/05/2016   ALBUMIN 4.1 09/05/2016   ALKPHOS 62 09/05/2016     Electrolytes Lab Results  Component Value Date   NA 136 11/25/2019   K 4.2 11/25/2019   CL 104 11/25/2019   CALCIUM 8.9 11/25/2019   MG 2.4 (H) 06/24/2017     Bone Lab Results  Component Value Date   25OHVITD1 37 06/24/2017   25OHVITD2 2.2 06/24/2017   25OHVITD3 35 06/24/2017     Inflammation (CRP: Acute Phase) (ESR: Chronic Phase) Lab Results  Component Value Date   CRP 2.8 06/24/2017   ESRSEDRATE 11 06/24/2017       Note: Above Lab results reviewed.  Imaging  DG PAIN CLINIC C-ARM 1-60 MIN NO REPORT Fluoro was used, but no Radiologist interpretation will be provided.  Please refer to "NOTES" tab for provider progress note.  Assessment  The primary encounter diagnosis was Chronic pain syndrome. Diagnoses of Lumbar facet joint syndrome (Bilateral), Chronic low back pain (Fourth Area of Pain) (Right) without sciatica, Chronic neck pain  (Primary Area of Pain) (Bilateral) (L>R), Chronic shoulder pain (Secondary Area of Pain) (Left), Occipital neuralgia (Tertiary Area of Pain) (Left), Chronic knee pain (Fifth Area of Pain) (Bilateral) (R>L), and Pharmacologic therapy were also pertinent to this visit.  Plan of Care  Problem-specific:  No problem-specific Assessment & Plan notes found for this encounter.  Mr. SADARIUS NORMAN has a current medication list which includes the following long-term medication(s): pantoprazole and tramadol.  Pharmacotherapy (Medications Ordered): No orders of the defined types were placed in this encounter.  Orders:  No orders of the defined types were placed in this encounter.  Follow-up plan:   No follow-ups on file.      Interventional  management options:  Considering:   Diagnostic left-sided cervical facet nerve block Possible left-sided cervical facet RFA Diagnosticleft suprascapular nerve block Diagnostictrigger point injection Diagnosticright lumbar facet nerve block Diagnostic right lumbar facet RFA Diagnostic Left knee Hyalgan series Diagnostic Right knee genicular nerve block Possible right knee genicular RFA   Palliative PRN treatment(s):   Diagnostic left-sided CESI #3 (PRN)       Recent Visits Date Type Provider Dept  02/23/20 Telemedicine Milinda Pointer, MD [facility]  02/09/20 Procedure visit Milinda Pointer, MD [facility]  01/26/20 Telemedicine Milinda Pointer, MD [facility]  01/05/20 Procedure visit Milinda Pointer, MD [facility]  Showing recent visits  within past 90 days and meeting all other requirements Today's Visits Date Type Provider Dept  03/06/20 Telemedicine Milinda Pointer, MD [facility]  Showing today's visits and meeting all other requirements Future Appointments Date Type Provider Dept  05/07/20 Appointment Milinda Pointer, MD [facility]  Showing future appointments within next 90 days and meeting all other requirements  I discussed the assessment and treatment plan with the patient. The patient was provided an opportunity to ask questions and all were answered. The patient agreed with the plan and demonstrated an understanding of the instructions.  Patient advised to call back or seek an in-person evaluation if the symptoms or condition worsens.  Duration of encounter: 15 minutes.  Note by: Gaspar Cola, MD Date: 03/06/2020; Time: 8:14 AM

## 2020-05-06 NOTE — Progress Notes (Signed)
Patient: Samuel Roberts  Service Category: E/M  Provider: Gaspar Cola, MD  DOB: 1956-02-20  DOS: 05/07/2020  Location: Office  MRN: [id number]  Setting: Ambulatory outpatient  Referring Provider: [name], MD  Type: Established Patient  Specialty: Interventional Pain Management  PCP: [name], MD  Location: Remote location  Delivery: TeleHealth     Virtual Encounter - Pain Management PROVIDER NOTE: Information contained herein reflects review and annotations entered in association with encounter. Interpretation of such information and data should be left to medically-trained personnel. Information provided to patient can be located elsewhere in the medical record under "Patient Instructions". Document created using STT-dictation technology, any transcriptional errors that may result from process are unintentional.    Contact & Pharmacy Preferred: [phone] Home: [phone] (home) Mobile: [phone] (mobile) E-mail: [email or web address]  [id number] [place] [address] [id number]: [phone]: [phone]  Pre-screening  Samuel Roberts offered "in-person" vs "virtual" encounter. He indicated preferring virtual for this encounter.   Reason COVID-19*   Social distancing based on CDC and AMA recommendations.   I contacted Samuel Roberts 05/07/2020 via telephone.      I clearly identified myself as FGaspar Cola MD. I verified that I was speaking with the correct person using two identifiers (Name: Samuel Roberts and date of birth: 8Aug 26, 1957.  Consent I sought verbal advanced consent from Samuel Pintofor virtual visit interactions. I informed Samuel Roberts of possible security and privacy concerns, risks, and limitations associated with providing "not-in-person" medical evaluation and management services. I also informed Samuel Roberts of the availability of "in-person" appointments. Finally, I informed  him that there would be a charge for the virtual visit and that he could be  personally, fully or partially, financially responsible for it. Mr. NManeraexpressed understanding and agreed to proceed.   Historic Elements   Samuel Roberts a 65y.o. year old, male patient evaluated today after our last contact on 02/09/2020. Samuel Roberts has a past medical history of BPH (benign prostatic hyperplasia) and Colitis, ulcerative ([place]. He also  has a past surgical history that includes Knee arthroscopy and arthrotomy; Tonsillectomy; LEFT HEART CATH AND CORONARY ANGIOGRAPHY (Left, 09/09/2016); and Joint replacement (Right, 02/17/2018). Mr. NAlbornozhas a current medication list which includes the following prescription(s): finasteride, mesalamine, pantoprazole, and [START ON 05/16/2020] tramadol. He  reports that he has never smoked. He has never used smokeless tobacco. He reports that he does not drink alcohol and does not use drugs. Samuel Roberts No Known Allergies.   HPI  Today, he is being contacted for medication management. The patient indicates doing well with the current medication regimen. No adverse reactions or side effects reported to the medications.  RTCB: 08/14/2020  Pharmacotherapy Assessment  Analgesic: Tramadol 50 mg, 1 tab PO BID (100 mg/day of tramadol) MME/day:136mday.   Monitoring: [place] PMP: PDMP reviewed during this encounter.       Pharmacotherapy: No side-effects or adverse reactions reported. Compliance: No problems identified. Effectiveness: Clinically acceptable. Plan: Refer to "POC".  UDS:  Summary  Date Value Ref Range Status  11/17/2019 Note  Final    Comment:    ==================================================================== [employer] Select 13 (MW) ==================================================================== Test                             Result       Flag  Units  Drug Present and Declared for Prescription Verification   Tramadol                        >1832        EXPECTED   ng/mg creat   O-Desmethyltramadol            >1832        EXPECTED   ng/mg creat   N-Desmethyltramadol            777          EXPECTED   ng/mg creat    Source of tramadol is a prescription medication. O-desmethyltramadol    and N-desmethyltramadol are expected metabolites of tramadol.  ==================================================================== Test                      Result    Flag   Units      Ref Range   Creatinine              273              mg/dL      >=20 ==================================================================== Declared Medications:  The flagging and interpretation on this report are based on the  following declared medications.  Unexpected results may arise from  inaccuracies in the declared medications.   **Note: The testing scope of this panel includes these medications:   Tramadol (Ultram)   **Note: The testing scope of this panel does not include the  following reported medications:   Finasteride (Propecia)  Mesalamine  Pantoprazole (Protonix) ==================================================================== For clinical consultation, please call [phone]. ====================================================================     Laboratory Chemistry Profile   Renal Lab Results  Component Value Date   BUN 14 11/25/2019   CREATININE 0.76 11/25/2019   GFRAA >60 11/25/2019   GFRNONAA >60 11/25/2019     Hepatic Lab Results  Component Value Date   AST 27 09/05/2016   ALT 15 (L) 09/05/2016   ALBUMIN 4.1 09/05/2016   ALKPHOS 62 09/05/2016     Electrolytes Lab Results  Component Value Date   NA 136 11/25/2019   K 4.2 11/25/2019   CL 104 11/25/2019   CALCIUM 8.9 11/25/2019   MG 2.4 (H) 06/24/2017     Bone Lab Results  Component Value Date   25OHVITD1 37 06/24/2017   25OHVITD2 2.2 06/24/2017   25OHVITD3 35 06/24/2017     Inflammation (CRP: Acute Phase) (ESR: Chronic Phase) Lab  Results  Component Value Date   CRP 2.8 06/24/2017   ESRSEDRATE 11 06/24/2017       Note: Above Lab results reviewed.  Imaging  DG PAIN CLINIC C-ARM 1-60 MIN NO REPORT Fluoro was used, but no Radiologist interpretation will be provided.  Please refer to "NOTES" tab for provider progress note.  Assessment  The primary encounter diagnosis was Chronic pain syndrome. Diagnoses of Lumbar facet joint syndrome (Bilateral), Chronic low back pain (Fourth Area of Pain) (Right) without sciatica, Chronic neck pain  (Primary Area of Pain) (Bilateral) (L>R), Chronic shoulder pain (Secondary Area of Pain) (Left), Occipital neuralgia (Tertiary Area of Pain) (Left), Chronic knee pain (Fifth Area of Pain) (Bilateral) (R>L), Pharmacologic therapy, and Uncomplicated opioid dependence (HCC) were also pertinent to this visit.  Plan of Care  Problem-specific:  No problem-specific Assessment & Plan notes found for this encounter.  Samuel Roberts has a current medication list which includes the following long-term medication(s): pantoprazole and [START ON 05/16/2020] tramadol.  Pharmacotherapy (Medications Ordered): Meds ordered this encounter  Medications   traMADol (ULTRAM) 50 MG tablet    Sig: Take 1 tablet (50 mg total) by mouth 2 (two) times daily.    Dispense:  60 tablet    Refill:  2    Chronic Pain: STOP Act (Not applicable) Fill 1 day early if closed on refill date. Avoid benzodiazepines within 8 hours of opioids   Orders:  No orders of the defined types were placed in this encounter.  Follow-up plan:   Return in about 3 months (around 08/14/2020) for (F2F), (Med Mgmt).      Interventional management options:  Considering:   Diagnostic left-sided cervical facet nerve block Possible left-sided cervical facet RFA Diagnosticleft suprascapular nerve block Diagnostictrigger point injection Diagnosticright lumbar facet nerve block Diagnostic right lumbar facet RFA Diagnostic Left  knee Hyalgan series Diagnostic Right knee genicular nerve block Possible right knee genicular RFA   Palliative PRN treatment(s):   Diagnostic left-sided CESI #3 (PRN)        Recent Visits Date Type Provider Dept  03/06/20 Telemedicine Milinda Pointer, [facility]  02/23/20 Telemedicine Milinda Pointer, MD [facility]  02/09/20 Procedure visit Milinda Pointer, MD [facility]  Showing recent visits within past 90 days and meeting all other requirements Today's Visits Date Type Provider Dept  05/07/20 Telemedicine Milinda Pointer, MD [facility]  Showing today's visits and meeting all other requirements Future Appointments No visits were found meeting these conditions. Showing future appointments within next 90 days and meeting all other requirements  I discussed the assessment and treatment plan with the patient. The patient was provided an opportunity to ask questions and all were answered. The patient agreed with the plan and demonstrated an understanding of the instructions.  Patient advised to call back or seek an in-person evaluation if the symptoms or condition worsens.  Duration of encounter: 12 minutes.  Note by: Gaspar Cola, MD Date: 05/07/2020; Time: 9:55 AM

## 2020-05-07 ENCOUNTER — Ambulatory Visit: Payer: BC Managed Care – PPO | Attending: Pain Medicine | Admitting: Pain Medicine

## 2020-05-07 ENCOUNTER — Other Ambulatory Visit: Payer: Self-pay

## 2020-05-07 DIAGNOSIS — M47816 Spondylosis without myelopathy or radiculopathy, lumbar region: Secondary | ICD-10-CM

## 2020-05-07 DIAGNOSIS — M545 Low back pain, unspecified: Secondary | ICD-10-CM

## 2020-05-07 DIAGNOSIS — M5481 Occipital neuralgia: Secondary | ICD-10-CM

## 2020-05-07 DIAGNOSIS — M542 Cervicalgia: Secondary | ICD-10-CM | POA: Diagnosis not present

## 2020-05-07 DIAGNOSIS — G894 Chronic pain syndrome: Secondary | ICD-10-CM | POA: Diagnosis not present

## 2020-05-07 DIAGNOSIS — M25561 Pain in right knee: Secondary | ICD-10-CM

## 2020-05-07 DIAGNOSIS — F1120 Opioid dependence, uncomplicated: Secondary | ICD-10-CM | POA: Insufficient documentation

## 2020-05-07 DIAGNOSIS — M25512 Pain in left shoulder: Secondary | ICD-10-CM

## 2020-05-07 DIAGNOSIS — M25562 Pain in left knee: Secondary | ICD-10-CM

## 2020-05-07 DIAGNOSIS — Z79899 Other long term (current) drug therapy: Secondary | ICD-10-CM

## 2020-05-07 DIAGNOSIS — G8929 Other chronic pain: Secondary | ICD-10-CM

## 2020-05-07 MED ORDER — TRAMADOL HCL 50 MG PO TABS: 50.0000 mg | ORAL_TABLET | Freq: Two times a day (BID) | ORAL | 2 refills | Status: DC

## 2020-05-08 ENCOUNTER — Other Ambulatory Visit: Payer: Self-pay

## 2020-06-04 DIAGNOSIS — E782 Mixed hyperlipidemia: Secondary | ICD-10-CM | POA: Insufficient documentation

## 2020-06-04 DIAGNOSIS — K219 Gastro-esophageal reflux disease without esophagitis: Secondary | ICD-10-CM | POA: Insufficient documentation

## 2020-07-02 ENCOUNTER — Emergency Department
Admission: EM | Admit: 2020-07-02 | Discharge: 2020-07-03 | Disposition: A | Discharge status: Home / Self Care | Payer: BC Managed Care – PPO | Attending: Emergency Medicine | Admitting: Emergency Medicine

## 2020-07-02 ENCOUNTER — Emergency Department: Payer: BC Managed Care – PPO

## 2020-07-02 ENCOUNTER — Other Ambulatory Visit: Payer: Self-pay

## 2020-07-02 DIAGNOSIS — R42 Dizziness and giddiness: Secondary | ICD-10-CM | POA: Diagnosis not present

## 2020-07-02 DIAGNOSIS — R079 Chest pain, unspecified: Secondary | ICD-10-CM | POA: Diagnosis present

## 2020-07-02 DIAGNOSIS — Z9660 Presence of unspecified orthopedic joint implant: Secondary | ICD-10-CM | POA: Insufficient documentation

## 2020-07-02 DIAGNOSIS — R110 Nausea: Secondary | ICD-10-CM | POA: Insufficient documentation

## 2020-07-02 LAB — CBC WITH DIFFERENTIAL/PLATELET
Abs Immature Granulocytes: 0.03 10*3/uL (ref 0.00–0.07)
Basophils Absolute: 0.1 10*3/uL (ref 0.0–0.1)
Basophils Relative: 1 %
Eosinophils Absolute: 0.2 10*3/uL (ref 0.0–0.5)
Eosinophils Relative: 2 %
HCT: 38.4 % — ABNORMAL LOW (ref 39.0–52.0)
Hemoglobin: 13.4 g/dL (ref 13.0–17.0)
Immature Granulocytes: 0 %
Lymphocytes Relative: 17 %
Lymphs Abs: 1.3 10*3/uL (ref 0.7–4.0)
MCH: 31.5 pg (ref 26.0–34.0)
MCHC: 34.9 g/dL (ref 30.0–36.0)
MCV: 90.4 fL (ref 80.0–100.0)
Monocytes Absolute: 0.5 10*3/uL (ref 0.1–1.0)
Monocytes Relative: 6 %
Neutro Abs: 5.4 10*3/uL (ref 1.7–7.7)
Neutrophils Relative %: 74 %
Platelets: 254 10*3/uL (ref 150–400)
RBC: 4.25 MIL/uL (ref 4.22–5.81)
RDW: 12.4 % (ref 11.5–15.5)
WBC: 7.5 10*3/uL (ref 4.0–10.5)
nRBC: 0 % (ref 0.0–0.2)

## 2020-07-02 LAB — COMPREHENSIVE METABOLIC PANEL
ALT: 20 U/L (ref 0–44)
AST: 29 U/L (ref 15–41)
Albumin: 3.9 g/dL (ref 3.5–5.0)
Alkaline Phosphatase: 43 U/L (ref 38–126)
Anion gap: 9 (ref 5–15)
BUN: 16 mg/dL (ref 8–23)
CO2: 21 mmol/L — ABNORMAL LOW (ref 22–32)
Calcium: 8.8 mg/dL — ABNORMAL LOW (ref 8.9–10.3)
Chloride: 105 mmol/L (ref 98–111)
Creatinine, Ser: 0.74 mg/dL (ref 0.61–1.24)
GFR, Estimated: >60 mL/min (ref >60)
Glucose, Bld: 130 mg/dL — ABNORMAL HIGH (ref 70–99)
Potassium: 3.5 mmol/L (ref 3.5–5.1)
Sodium: 135 mmol/L (ref 135–145)
Total Bilirubin: 0.8 mg/dL (ref 0.3–1.2)
Total Protein: 6.8 g/dL (ref 6.5–8.1)

## 2020-07-02 LAB — LIPASE, BLOOD: Lipase: 27 U/L (ref 11–51)

## 2020-07-02 LAB — TROPONIN I (HIGH SENSITIVITY)
Troponin I (High Sensitivity): 3 ng/L (ref <18)
Troponin I (High Sensitivity): 3 ng/L (ref <18)

## 2020-07-02 LAB — D-DIMER, QUANTITATIVE: D-Dimer, Quant: 0.59 ug/mL-FEU — ABNORMAL HIGH (ref 0.00–0.50)

## 2020-07-02 MED ORDER — SODIUM CHLORIDE 0.9 % IV BOLUS
500.0000 mL | Freq: Once | INTRAVENOUS | Status: AC
  Administered 2020-07-03: 500 mL via INTRAVENOUS

## 2020-07-02 NOTE — ED Provider Notes (Signed)
[facility] Emergency Department Provider Note ____________________________________________   Event Date/Time   First MD Initiated Contact with Patient 07/02/20 2117     (approximate)  I have reviewed the triage vital signs and the nursing notes.   HISTORY  Chief Complaint Chest Pain    HPI Samuel Roberts is a 65 y.o. male with PMH as noted below including angina, chronic back pain, and a remote history of DVT (secondary to trauma) presents with chest pain over the last several days, described mainly as tightness, but also sometimes feeling like a pinching sensation.  It is associated with some nausea and lightheadedness.  It is not exertional.  He denies any prior history of this pain.  He states that it improved when he was given nitroglycerin by EMS.  Past Medical History:  Diagnosis Date  . BPH (benign prostatic hyperplasia)   . Colitis, ulcerative ([place])     Patient Active Problem List   Diagnosis Date Noted  . Uncomplicated opioid dependence ([place]) 05/07/2020  . Spondylosis without myelopathy or radiculopathy, lumbosacral region 02/09/2020  . DDD (degenerative disc disease), lumbar 02/09/2020  . Lumbar facet joint syndrome (Bilateral) 01/26/2020  . Trigger point with back pain (Right) 11/16/2019  . Fever 08/23/2018  . Bilateral lower abdominal pain 08/23/2018  . Generalized muscle weakness 04/05/2018  . History of DVT (deep vein thrombosis) 02/17/2018  . IBD (inflammatory bowel disease) 02/17/2018  . BMI 30.0-30.9,adult 01/13/2018  . Status post total right knee replacement 01/13/2018  . Other bilateral secondary osteoarthritis of knee 01/13/2018  . Osteoarthritis of knees (Bilateral) 08/05/2017  . Chronic upper extremity pain (Left) 07/13/2017  . DDD (degenerative disc disease), cervical 07/13/2017  . Cervical spondylosis 07/13/2017  . 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 Cervicalgia 07/13/2017  . Chronic  neck pain  (Primary Area of Pain) (Bilateral) (L>R) 06/24/2017  . Occipital neuralgia [facility] Area of Pain) (Left) 06/24/2017  . Chronic shoulder pain (Secondary Area of Pain) (Left) 06/24/2017  . Chronic low back pain (Fourth Area of Pain) (Right) without sciatica 06/24/2017  . Chronic knee pain (Fifth Area of Pain) (Bilateral) (R>L) 06/24/2017  . Chronic pain syndrome 06/24/2017  . Long term current use of opiate analgesic 06/24/2017  . Pharmacologic therapy 06/24/2017  . Disorder of skeletal system 06/24/2017  . Problems influencing health status 06/24/2017  . Pain of left humerus 06/24/2017  . Angina pectoris ([place]) 09/06/2016  . Crohn's colitis, without complications ([place]) 42/68/3419  . Arthropathy of knee (Right) 01/02/2015  . Tricompartment osteoarthritis of knee (Right) 01/02/2015  . BPH (benign prostatic hyperplasia) 03/08/2012  . Carcinoma in situ of prostate 03/08/2012  . Elevated prostate specific antigen (PSA) 03/08/2012  . Family history of malignant neoplasm of prostate 03/08/2012  . Enlarged prostate with lower urinary tract symptoms (LUTS) 03/08/2012    Past Surgical History:  Procedure Laterality Date  . JOINT REPLACEMENT Right 02/17/2018   pins and tendon replacement.   Marland Kitchen KNEE ARTHROSCOPY AND ARTHROTOMY    . LEFT HEART CATH AND CORONARY ANGIOGRAPHY Left 09/09/2016   Procedure: Left Heart Cath and Coronary Angiography;  Surgeon: Yolonda Kida, MD;  Location: [facility];  Service: Cardiovascular;  Laterality: Left;  . TONSILLECTOMY      Prior to Admission medications   Medication Sig Start Date End Date Taking? Authorizing Provider  dicyclomine (BENTYL) 10 MG capsule Take 10 mg by mouth 2 (two) times daily as needed. 05/29/20  Yes [provider]  finasteride (PROPECIA) 1 MG tablet Take 5 mg by mouth daily.   Yes [provider]  mesalamine (LIALDA) 1.2 g EC tablet Take 1.2 g by mouth 2 (two) times daily.   Yes [provider]  pantoprazole (PROTONIX) 40 MG tablet Take 40 mg by mouth every other day.  08/04/16  Yes [provider]  traMADol (ULTRAM) 50 MG tablet Take 1 tablet (50 mg total) by mouth 2 (two) times daily. 05/16/20 08/14/20 Yes Milinda Pointer, MD    Allergies Patient has no known allergies.  Family History  Problem Relation Age of Onset  . Heart failure Mother   . Diabetes Mother   . Heart failure Father   . Cancer Father   . Diabetes Sister   . Cirrhosis Brother   . Heart disease Brother   . Heart disease Brother   . Drug abuse Brother   . Drug abuse Brother   . Diabetes Sister     Social History Social History   Tobacco Use  . Smoking status: Never Smoker  . Smokeless tobacco: Never Used  Substance Use Topics  . Alcohol use: No  . Drug use: No    Review of Systems  Constitutional: No fever. Eyes: No redness. ENT: No sore throat. Cardiovascular: Positive for chest pain. Respiratory: Positive for shortness of breath. Gastrointestinal: No vomiting or diarrhea.  Genitourinary: Negative for flank pain. Musculoskeletal: Negative for back pain. Skin: Negative for rash. Neurological: Negative for headache.   ____________________________________________   PHYSICAL EXAM:  VITAL SIGNS: ED Triage Vitals  Enc Vitals Group     BP 07/02/20 2044 130/86     Pulse Rate 07/02/20 2044 80     Resp 07/02/20 2044 18     Temp 07/02/20 2044 98.1 F (36.7 C)     Temp Source 07/02/20 2044 Oral     SpO2 07/02/20 2044 98 %     Weight 07/02/20 2045 200 lb (90.7 kg)     Height 07/02/20 2045 5' 10"  (1.778 m)     Head Circumference --      Peak Flow --      Pain Score 07/02/20 2048 2     Pain Loc --      Pain Edu? --      Excl. in [place]? --     Constitutional: Alert and oriented. Well appearing and in no acute distress. Eyes: Conjunctivae are normal.  Head: Atraumatic. Nose: No congestion/rhinnorhea. Mouth/Throat: Mucous membranes are moist.   Neck: Normal range of  motion.  Cardiovascular: Normal rate, regular rhythm. Grossly normal heart sounds.  Good peripheral circulation. Respiratory: Normal respiratory effort.  No retractions. Lungs CTAB. Gastrointestinal: Soft and nontender. No distention.  Genitourinary: No flank tenderness. Musculoskeletal: No lower extremity edema.  Extremities warm and well perfused.  Neurologic:  Normal speech and language. No gross focal neurologic deficits are appreciated.  Skin:  Skin is warm and dry. No rash noted. Psychiatric: Mood and affect are normal. Speech and behavior are normal.  ____________________________________________   LABS (all labs ordered are listed, but only abnormal results are displayed)  Labs Reviewed  CBC WITH DIFFERENTIAL/PLATELET - Abnormal; Notable for the following components:      Result Value   HCT 38.4 (*)    All other components within normal limits  COMPREHENSIVE METABOLIC PANEL - Abnormal; Notable for the following components:   CO2 21 (*)    Glucose, Bld 130 (*)    Calcium 8.8 (*)    All other components  within normal limits  D-DIMER, QUANTITATIVE - Abnormal; Notable for the following components:   D-Dimer, Quant 0.59 (*)    All other components within normal limits  LIPASE, BLOOD  URINALYSIS, COMPLETE (UACMP) WITH MICROSCOPIC  TROPONIN I (HIGH SENSITIVITY)  TROPONIN I (HIGH SENSITIVITY)   ____________________________________________  EKG  ED ECG REPORT I, Arta Silence, the attending physician, personally viewed and interpreted this ECG.  Date: 07/02/2020 EKG Time: 2043 Rate: 83 Rhythm: normal sinus rhythm QRS Axis: normal Intervals: normal ST/T Wave abnormalities: normal Narrative Interpretation: no evidence of acute ischemia  ____________________________________________  RADIOLOGY  Chest x-ray interpreted by me shows no focal infiltrate or edema  ____________________________________________   PROCEDURES  Procedure(s) performed:  No  Procedures  Critical Care performed: No ____________________________________________   INITIAL IMPRESSION / ASSESSMENT AND PLAN / ED COURSE  Pertinent labs & imaging results that were available during my care of the patient were reviewed by me and considered in my medical decision making (see chart for details).  65 year old male with PMH as noted above including angina, chronic back pain, and remote history of provoked DVT (not on anticoagulation) presents with somewhat atypical nonexertional chest pain over the last few days.  I reviewed the past medical records in [place].  The patient follows with Dr. Clayborn Bigness from cardiology.  He has a history of angina as well as OSA and DVT.  He was last seen by cardiology in early 2021 and had functional testing and an echocardiogram shortly before that which were unremarkable.  The patient states that he had a DVT a long time ago after a leg fracture.  He is not on anticoagulation.  He does not take aspirin, but was given aspirin by EMS.  He reports a cardiac cath a few years ago in which he was told he had mild coronary disease, but does not have any stents.  On exam the patient is overall well-appearing.  His vital signs are normal.  The physical exam is unremarkable.  EKG is nonischemic.  Differential includes exacerbation of angina, ACS, CHF or other cardiac cause, GERD, pancreatitis, or musculoskeletal pain or other benign etiology.  I have a low suspicion for PE given that he has no findings to suggest DVT and there is no hypoxia or tachycardia.  There is also no evidence of aortic dissection or other vascular etiology given the normal vital signs, his well appearance, and the location and quality of the pain.  We will obtain a chest x-ray, basic and hepatobiliary labs, cardiac enzymes x2, and reassess.  ----------------------------------------- 10:45 PM on  07/02/2020 -----------------------------------------  Initial troponin is negative and the patient is chest pain-free at this time.  Although overall risk of PE is low, given the patient's known DVT history and the otherwise unexplained pain we will obtain a D-dimer with the second troponin.  ----------------------------------------- 11:13 PM on 07/02/2020 -----------------------------------------  Repeat labs are pending.  I signed the patient out to the oncoming ED physician Dr. Beather Arbour.  ____________________________________________   FINAL CLINICAL IMPRESSION(S) / ED DIAGNOSES  Final diagnoses:  Chest pain, unspecified type      NEW MEDICATIONS STARTED DURING THIS VISIT:  New Prescriptions   No medications on file     Note:  This document was prepared using Dragon voice recognition software and may include unintentional dictation errors.    Arta Silence, MD 07/02/20 [phone]

## 2020-07-02 NOTE — ED Provider Notes (Signed)
-----------------------------------------   11:56 PM on 07/02/2020 -----------------------------------------  Repeat troponin unremarkable.  Noted slightly elevated D-dimer.  Patient has had a history of lower extremity DVT multiple years ago status post leg fracture.  Given mildly elevated D-dimer, will obtain BLE Doppler ultrasounds as well as CTA chest to evaluate for PE.   ----------------------------------------- 2:13 AM on 07/03/2020 -----------------------------------------  Updated patient and family member on negative CTA chest and BLE Doppler ultrasounds.  He will follow-up with his cardiologist Dr. Clayborn Bigness.  Strict return precautions given.  Patient verbalizes understanding agrees with plan of care.   Paulette Blanch, MD 07/03/20 [phone]

## 2020-07-02 NOTE — ED Notes (Signed)
Pt given a warm blanket 

## 2020-07-02 NOTE — Discharge Instructions (Signed)
Follow-up with Dr. Clayborn Bigness as scheduled.  Return to the ER for new, worsening, or persistent severe chest pain, difficulty breathing, weakness or lightheadedness, or any other new or worsening symptoms that concern you.

## 2020-07-02 NOTE — ED Triage Notes (Signed)
EMS brings pt in from [facility] pt when for c/o left sided CP radiating into neck x 3 days, worse tonight and

## 2020-07-02 NOTE — ED Provider Notes (Incomplete)
-----------------------------------------   11:56 PM on 07/02/2020 -----------------------------------------  Repeat troponin unremarkable.  Noted slightly elevated D-dimer.  Patient has had a history of lower extremity DVT multiple years ago status post leg fracture.  Given mildly elevated D-dimer, will obtain BLE Doppler ultrasounds as well as CTA chest to evaluate for PE.

## 2020-07-02 NOTE — ED Triage Notes (Signed)
Pt in with co chest pain hx of blockage in the past, states pain was a tightness. States has felt tired, and also has had some abd pain. States pain is better after nitros given by EMS.

## 2020-07-03 ENCOUNTER — Emergency Department: Payer: BC Managed Care – PPO

## 2020-07-03 MED ORDER — IOHEXOL 350 MG/ML SOLN
75.0000 mL | Freq: Once | INTRAVENOUS | Status: AC | PRN
  Administered 2020-07-03: 75 mL via INTRAVENOUS

## 2020-07-18 ENCOUNTER — Other Ambulatory Visit (HOSPITAL_COMMUNITY): Payer: Self-pay | Admitting: Internal Medicine

## 2020-07-18 DIAGNOSIS — I208 Other forms of angina pectoris: Secondary | ICD-10-CM

## 2020-07-23 ENCOUNTER — Other Ambulatory Visit
Admission: RE | Admit: 2020-07-23 | Discharge: 2020-07-23 | Disposition: A | Discharge status: Home / Self Care | Payer: BC Managed Care – PPO | Source: Ambulatory Visit | Attending: Internal Medicine | Admitting: Internal Medicine

## 2020-07-23 ENCOUNTER — Other Ambulatory Visit: Payer: Self-pay

## 2020-07-23 ENCOUNTER — Encounter: Payer: Self-pay | Admitting: Urgent Care

## 2020-07-23 DIAGNOSIS — R42 Dizziness and giddiness: Secondary | ICD-10-CM | POA: Insufficient documentation

## 2020-07-23 DIAGNOSIS — Z8719 Personal history of other diseases of the digestive system: Secondary | ICD-10-CM | POA: Diagnosis not present

## 2020-07-23 DIAGNOSIS — Z8249 Family history of ischemic heart disease and other diseases of the circulatory system: Secondary | ICD-10-CM | POA: Diagnosis not present

## 2020-07-23 DIAGNOSIS — Z7982 Long term (current) use of aspirin: Secondary | ICD-10-CM | POA: Diagnosis not present

## 2020-07-23 DIAGNOSIS — Z6829 Body mass index (BMI) 29.0-29.9, adult: Secondary | ICD-10-CM | POA: Diagnosis not present

## 2020-07-23 DIAGNOSIS — N400 Enlarged prostate without lower urinary tract symptoms: Secondary | ICD-10-CM | POA: Diagnosis not present

## 2020-07-23 DIAGNOSIS — Z20822 Contact with and (suspected) exposure to covid-19: Secondary | ICD-10-CM | POA: Insufficient documentation

## 2020-07-23 DIAGNOSIS — Z79899 Other long term (current) drug therapy: Secondary | ICD-10-CM | POA: Insufficient documentation

## 2020-07-23 DIAGNOSIS — Z86718 Personal history of other venous thrombosis and embolism: Secondary | ICD-10-CM | POA: Insufficient documentation

## 2020-07-23 DIAGNOSIS — I25119 Atherosclerotic heart disease of native coronary artery with unspecified angina pectoris: Secondary | ICD-10-CM | POA: Diagnosis not present

## 2020-07-23 DIAGNOSIS — Z801 Family history of malignant neoplasm of trachea, bronchus and lung: Secondary | ICD-10-CM | POA: Diagnosis not present

## 2020-07-23 DIAGNOSIS — Z8042 Family history of malignant neoplasm of prostate: Secondary | ICD-10-CM | POA: Diagnosis not present

## 2020-07-23 DIAGNOSIS — Z8041 Family history of malignant neoplasm of ovary: Secondary | ICD-10-CM | POA: Diagnosis not present

## 2020-07-23 DIAGNOSIS — Z01810 Encounter for preprocedural cardiovascular examination: Secondary | ICD-10-CM | POA: Insufficient documentation

## 2020-07-23 DIAGNOSIS — Z8546 Personal history of malignant neoplasm of prostate: Secondary | ICD-10-CM | POA: Diagnosis not present

## 2020-07-23 DIAGNOSIS — R079 Chest pain, unspecified: Secondary | ICD-10-CM | POA: Insufficient documentation

## 2020-07-23 DIAGNOSIS — E669 Obesity, unspecified: Secondary | ICD-10-CM | POA: Diagnosis not present

## 2020-07-23 DIAGNOSIS — K219 Gastro-esophageal reflux disease without esophagitis: Secondary | ICD-10-CM | POA: Diagnosis not present

## 2020-07-23 DIAGNOSIS — E785 Hyperlipidemia, unspecified: Secondary | ICD-10-CM | POA: Diagnosis not present

## 2020-07-23 DIAGNOSIS — G4733 Obstructive sleep apnea (adult) (pediatric): Secondary | ICD-10-CM | POA: Diagnosis not present

## 2020-07-23 LAB — BRAIN NATRIURETIC PEPTIDE: B Natriuretic Peptide: 44.3 pg/mL (ref 0.0–100.0)

## 2020-07-24 LAB — SARS CORONAVIRUS 2 (TAT 6-24 HRS): SARS Coronavirus 2: NEGATIVE

## 2020-07-25 ENCOUNTER — Encounter
Admission: RE | Disposition: A | Discharge status: Home / Self Care | Payer: Self-pay | Source: Home / Self Care | Attending: Internal Medicine

## 2020-07-25 ENCOUNTER — Encounter: Payer: Self-pay | Admitting: Internal Medicine

## 2020-07-25 ENCOUNTER — Other Ambulatory Visit: Payer: Self-pay

## 2020-07-25 ENCOUNTER — Ambulatory Visit
Admission: RE | Admit: 2020-07-25 | Discharge: 2020-07-25 | Disposition: A | Discharge status: Home / Self Care | Payer: BC Managed Care – PPO | Attending: Internal Medicine | Admitting: Internal Medicine

## 2020-07-25 DIAGNOSIS — I25119 Atherosclerotic heart disease of native coronary artery with unspecified angina pectoris: Secondary | ICD-10-CM | POA: Insufficient documentation

## 2020-07-25 DIAGNOSIS — N400 Enlarged prostate without lower urinary tract symptoms: Secondary | ICD-10-CM | POA: Insufficient documentation

## 2020-07-25 DIAGNOSIS — Z79899 Other long term (current) drug therapy: Secondary | ICD-10-CM | POA: Insufficient documentation

## 2020-07-25 DIAGNOSIS — Z86718 Personal history of other venous thrombosis and embolism: Secondary | ICD-10-CM | POA: Insufficient documentation

## 2020-07-25 DIAGNOSIS — Z8042 Family history of malignant neoplasm of prostate: Secondary | ICD-10-CM | POA: Insufficient documentation

## 2020-07-25 DIAGNOSIS — Z801 Family history of malignant neoplasm of trachea, bronchus and lung: Secondary | ICD-10-CM | POA: Insufficient documentation

## 2020-07-25 DIAGNOSIS — E785 Hyperlipidemia, unspecified: Secondary | ICD-10-CM | POA: Insufficient documentation

## 2020-07-25 DIAGNOSIS — R079 Chest pain, unspecified: Secondary | ICD-10-CM

## 2020-07-25 DIAGNOSIS — Z8249 Family history of ischemic heart disease and other diseases of the circulatory system: Secondary | ICD-10-CM | POA: Insufficient documentation

## 2020-07-25 DIAGNOSIS — Z7982 Long term (current) use of aspirin: Secondary | ICD-10-CM | POA: Insufficient documentation

## 2020-07-25 DIAGNOSIS — Z20822 Contact with and (suspected) exposure to covid-19: Secondary | ICD-10-CM | POA: Insufficient documentation

## 2020-07-25 DIAGNOSIS — G4733 Obstructive sleep apnea (adult) (pediatric): Secondary | ICD-10-CM | POA: Insufficient documentation

## 2020-07-25 DIAGNOSIS — Z8719 Personal history of other diseases of the digestive system: Secondary | ICD-10-CM | POA: Insufficient documentation

## 2020-07-25 DIAGNOSIS — K219 Gastro-esophageal reflux disease without esophagitis: Secondary | ICD-10-CM | POA: Insufficient documentation

## 2020-07-25 DIAGNOSIS — Z6829 Body mass index (BMI) 29.0-29.9, adult: Secondary | ICD-10-CM | POA: Insufficient documentation

## 2020-07-25 DIAGNOSIS — Z8041 Family history of malignant neoplasm of ovary: Secondary | ICD-10-CM | POA: Insufficient documentation

## 2020-07-25 DIAGNOSIS — Z8546 Personal history of malignant neoplasm of prostate: Secondary | ICD-10-CM | POA: Insufficient documentation

## 2020-07-25 DIAGNOSIS — E669 Obesity, unspecified: Secondary | ICD-10-CM | POA: Insufficient documentation

## 2020-07-25 HISTORY — PX: LEFT HEART CATH AND CORONARY ANGIOGRAPHY: CATH118249

## 2020-07-25 HISTORY — DX: Gastro-esophageal reflux disease without esophagitis: K21.9

## 2020-07-25 HISTORY — DX: Essential (primary) hypertension: I10

## 2020-07-25 SURGERY — LEFT HEART CATH AND CORONARY ANGIOGRAPHY
Anesthesia: Moderate Sedation | Laterality: Left

## 2020-07-25 MED ORDER — FENTANYL CITRATE (PF) 100 MCG/2ML IJ SOLN
INTRAMUSCULAR | Status: AC
  Filled 2020-07-25: qty 2

## 2020-07-25 MED ORDER — IOHEXOL 300 MG/ML  SOLN
INTRAMUSCULAR | Status: DC | PRN
  Administered 2020-07-25: 52 mL

## 2020-07-25 MED ORDER — ASPIRIN 81 MG PO CHEW
CHEWABLE_TABLET | ORAL | Status: AC
  Filled 2020-07-25: qty 1

## 2020-07-25 MED ORDER — HEPARIN SODIUM (PORCINE) 1000 UNIT/ML IJ SOLN
INTRAMUSCULAR | Status: AC
  Filled 2020-07-25: qty 1

## 2020-07-25 MED ORDER — SODIUM CHLORIDE 0.9% FLUSH: 3.0000 mL | INTRAVENOUS | Status: DC | PRN

## 2020-07-25 MED ORDER — HEPARIN (PORCINE) IN NACL 2000-0.9 UNIT/L-% IV SOLN
INTRAVENOUS | Status: DC | PRN
  Administered 2020-07-25: 1000 mL

## 2020-07-25 MED ORDER — SODIUM CHLORIDE 0.9% FLUSH: 3.0000 mL | Freq: Two times a day (BID) | INTRAVENOUS | Status: DC

## 2020-07-25 MED ORDER — SODIUM CHLORIDE 0.9 % WEIGHT BASED INFUSION: 1.0000 mL/kg/h | INTRAVENOUS | Status: DC

## 2020-07-25 MED ORDER — ONDANSETRON HCL 4 MG/2ML IJ SOLN
4.0000 mg | Freq: Four times a day (QID) | INTRAMUSCULAR | Status: DC | PRN
Start: 2020-07-25 — End: 2020-07-25

## 2020-07-25 MED ORDER — HYDRALAZINE HCL 20 MG/ML IJ SOLN: 10.0000 mg | INTRAMUSCULAR | Status: DC | PRN

## 2020-07-25 MED ORDER — SODIUM CHLORIDE 0.9 % WEIGHT BASED INFUSION
3.0000 mL/kg/h | INTRAVENOUS | Status: AC
  Administered 2020-07-25 (×2): 3 mL/kg/h via INTRAVENOUS

## 2020-07-25 MED ORDER — SODIUM CHLORIDE 0.9 % IV SOLN: 250.0000 mL | INTRAVENOUS | Status: DC | PRN

## 2020-07-25 MED ORDER — HEPARIN SODIUM (PORCINE) 1000 UNIT/ML IJ SOLN
INTRAMUSCULAR | Status: DC | PRN
  Administered 2020-07-25: 4500 [IU] via INTRAVENOUS

## 2020-07-25 MED ORDER — MIDAZOLAM HCL 2 MG/2ML IJ SOLN
INTRAMUSCULAR | Status: DC | PRN
  Administered 2020-07-25 (×2): 1 mg via INTRAVENOUS

## 2020-07-25 MED ORDER — ASPIRIN 81 MG PO CHEW
81.0000 mg | CHEWABLE_TABLET | ORAL | Status: AC
  Administered 2020-07-25: 81 mg via ORAL

## 2020-07-25 MED ORDER — HEPARIN (PORCINE) IN NACL 1000-0.9 UT/500ML-% IV SOLN
INTRAVENOUS | Status: AC
  Filled 2020-07-25: qty 1000

## 2020-07-25 MED ORDER — FENTANYL CITRATE (PF) 100 MCG/2ML IJ SOLN
INTRAMUSCULAR | Status: DC | PRN
  Administered 2020-07-25: 50 ug via INTRAVENOUS
  Administered 2020-07-25: 25 ug via INTRAVENOUS

## 2020-07-25 MED ORDER — MIDAZOLAM HCL 2 MG/2ML IJ SOLN
INTRAMUSCULAR | Status: AC
  Filled 2020-07-25: qty 2

## 2020-07-25 MED ORDER — ACETAMINOPHEN 325 MG PO TABS: 650.0000 mg | ORAL_TABLET | ORAL | Status: DC | PRN

## 2020-07-25 MED ORDER — VERAPAMIL HCL 2.5 MG/ML IV SOLN
INTRAVENOUS | Status: AC
  Filled 2020-07-25: qty 2

## 2020-07-25 MED ORDER — VERAPAMIL HCL 2.5 MG/ML IV SOLN
INTRAVENOUS | Status: DC | PRN
  Administered 2020-07-25: 2.5 mg via INTRA_ARTERIAL

## 2020-07-25 MED ORDER — LABETALOL HCL 5 MG/ML IV SOLN: 10.0000 mg | INTRAVENOUS | Status: DC | PRN

## 2020-07-25 SURGICAL SUPPLY — 11 items
CATH INFINITI 5 FR JL3.5 (CATHETERS) ×1 IMPLANT
CATH INFINITI JR4 5F (CATHETERS) ×2 IMPLANT
DEVICE RAD TR BAND REGULAR (VASCULAR PRODUCTS) ×1 IMPLANT
DRAPE BRACHIAL (DRAPES) ×2 IMPLANT
GLIDESHEATH SLEND SS 6F .021 (SHEATH) ×2 IMPLANT
GUIDEWIRE INQWIRE 1.5J.035X260 (WIRE) IMPLANT
INQWIRE 1.5J .035X260CM (WIRE) ×2
PACK CARDIAC CATH (CUSTOM PROCEDURE TRAY) ×2 IMPLANT
PROTECTION STATION PRESSURIZED (MISCELLANEOUS) ×2
SET ATX SIMPLICITY (MISCELLANEOUS) ×1 IMPLANT
STATION PROTECTION PRESSURIZED (MISCELLANEOUS) IMPLANT

## 2020-07-25 NOTE — Discharge Instructions (Signed)
Radial Site Care  This sheet gives you information about how to care for yourself after your procedure. Your health care provider may also give you more specific instructions. If you have problems or questions, contact your health care provider. What can I expect after the procedure? After the procedure, it is common to have:  Bruising and tenderness at the catheter insertion area. Follow these instructions at home: Medicines  Take over-the-counter and prescription medicines only as told by your health care provider. Insertion site care  Follow instructions from your health care provider about how to take care of your insertion site. Make sure you: ? Wash your hands with soap and water before you change your bandage (dressing). If soap and water are not available, use hand sanitizer. ? Change your dressing as told by your health care provider. ? Leave stitches (sutures), skin glue, or adhesive strips in place. These skin closures may need to stay in place for 2 weeks or longer. If adhesive strip edges start to loosen and curl up, you may trim the loose edges. Do not remove adhesive strips completely unless your health care provider tells you to do that.  Check your insertion site every day for signs of infection. Check for: ? Redness, swelling, or pain. ? Fluid or blood. ? Pus or a bad smell. ? Warmth.  Do not take baths, swim, or use a hot tub until your health care provider approves.  You may shower 24-48 hours after the procedure, or as directed by your health care provider. ? Remove the dressing and gently wash the site with plain soap and water. ? Pat the area dry with a clean towel. ? Do not rub the site. That could cause bleeding.  Do not apply powder or lotion to the site. Activity  For 24 hours after the procedure, or as directed by your health care provider: ? Do not flex or bend the affected arm. ? Do not push or pull heavy objects with the affected arm. ? Do not drive  yourself home from the hospital or clinic. You may drive 24 hours after the procedure unless your health care provider tells you not to. ? Do not operate machinery or power tools.  Do not lift anything that is heavier than 10 lb (4.5 kg), or the limit that you are told, until your health care provider says that it is safe.  Ask your health care provider when it is okay to: ? Return to work or school. ? Resume usual physical activities or sports. ? Resume sexual activity.   General instructions  If the catheter site starts to bleed, raise your arm and put firm pressure on the site. If the bleeding does not stop, get help right away. This is a medical emergency.  If you went home on the same day as your procedure, a responsible adult should be with you for the first 24 hours after you arrive home.  Keep all follow-up visits as told by your health care provider. This is important. Contact a health care provider if:  You have a fever.  You have redness, swelling, or yellow drainage around your insertion site. Get help right away if:  You have unusual pain at the radial site.  The catheter insertion area swells very fast.  The insertion area is bleeding, and the bleeding does not stop when you hold steady pressure on the area.  Your arm or hand becomes pale, cool, tingly, or numb. These symptoms may represent a serious  problem that is an emergency. Do not wait to see if the symptoms will go away. Get medical help right away. Call your local emergency services (911 in the [place] Do not drive yourself to the hospital. Summary  After the procedure, it is common to have bruising and tenderness at the site.  Follow instructions from your health care provider about how to take care of your radial site wound. Check the wound every day for signs of infection.  Do not lift anything that is heavier than 10 lb (4.5 kg), or the limit that you are told, until your health care provider says that it  is safe. This information is not intended to replace advice given to you by your health care provider. Make sure you discuss any questions you have with your health care provider. Document Revised: 05/13/2017 Document Reviewed: 05/13/2017 Elsevier Patient Education  2021 [place]. Moderate Conscious Sedation, Adult, Care After This sheet gives you information about how to care for yourself after your procedure. Your health care provider may also give you more specific instructions. If you have problems or questions, contact your health care provider. What can I expect after the procedure? After the procedure, it is common to have:  Sleepiness for several hours.  Impaired judgment for several hours.  Difficulty with balance.  Vomiting if you eat too soon. Follow these instructions at home: For the time period you were told by your health care provider:  Rest.  Do not participate in activities where you could fall or become injured.  Do not drive or use machinery.  Do not drink alcohol.  Do not take sleeping pills or medicines that cause drowsiness.  Do not make important decisions or sign legal documents.  Do not take care of children on your own.      Eating and drinking  Follow the diet recommended by your health care provider.  Drink enough fluid to keep your urine pale yellow.  If you vomit: ? Drink water, juice, or soup when you can drink without vomiting. ? Make sure you have little or no nausea before eating solid foods.   General instructions  Take over-the-counter and prescription medicines only as told by your health care provider.  Have a responsible adult stay with you for the time you are told. It is important to have someone help care for you until you are awake and alert.  Do not smoke.  Keep all follow-up visits as told by your health care provider. This is important. Contact a health care provider if:  You are still sleepy or having trouble  with balance after 24 hours.  You feel light-headed.  You keep feeling nauseous or you keep vomiting.  You develop a rash.  You have a fever.  You have redness or swelling around the IV site. Get help right away if:  You have trouble breathing.  You have new-onset confusion at home. Summary  After the procedure, it is common to feel sleepy, have impaired judgment, or feel nauseous if you eat too soon.  Rest after you get home. Know the things you should not do after the procedure.  Follow the diet recommended by your health care provider and drink enough fluid to keep your urine pale yellow.  Get help right away if you have trouble breathing or new-onset confusion at home. This information is not intended to replace advice given to you by your health care provider. Make sure you discuss any questions you have with your  health care provider. Document Revised: 08/05/2019 Document Reviewed: 03/03/2019 Elsevier Patient Education  2021 [employer].

## 2020-07-26 ENCOUNTER — Encounter: Payer: Self-pay | Admitting: Internal Medicine

## 2020-08-02 ENCOUNTER — Ambulatory Visit: Admission: RE | Admit: 2020-08-02 | Payer: BC Managed Care – PPO | Source: Ambulatory Visit

## 2020-08-12 NOTE — Progress Notes (Signed)
PROVIDER NOTE: Information contained herein reflects review and annotations entered in association with encounter. Interpretation of such information and data should be left to medically-trained personnel. Information provided to patient can be located elsewhere in the medical record under "Patient Instructions". Document created using STT-dictation technology, any transcriptional errors that may result from process are unintentional.    Patient: Samuel Roberts  Service Category: E/M  Provider: Gaspar Cola, MD  DOB: 1955/06/17  DOS: 08/13/2020  Specialty: Interventional Pain Management  MRN: [id number]  Setting: Ambulatory outpatient  PCP: [name], MD  Type: Established Patient    Referring Provider: [name], MD  Location: Office  Delivery: Face-to-face     HPI  Samuel Roberts, a 65 y.o. year old male, is here today because of his Chronic pain syndrome [G89.4]. Samuel Roberts primary complain today is Shoulder Pain (left), Back Pain (Lower right), and Knee Pain (right) Last encounter: My last encounter with him was on Visit date not found. 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 Pain Assessment: Severity of   is reported as a 6 /10. Location: Shoulder Left/left neck area , hard to turn his head to the left. Onset: More than a month ago. Quality: Sore,Constant,Dull,Aching. Timing: Constant. Modifying factor(s): Tramadol, heat, stretching. Vitals:  height is 5' 10" (1.778 m) and weight is 200 lb (90.7 kg). His temporal temperature is 97.3 F (36.3 C) (abnormal). His blood pressure is 146/89 (abnormal) and his pulse is 65. His respiration is 18 and oxygen saturation is 98%.   Reason for encounter: medication management.   The patient indicates doing well with the current medication regimen. No adverse reactions or side effects reported to the medications.  Today the patient had some questions about the tramadol which we answered to his satisfaction.  He was wondering if the tramadol had any muscle relaxer combined with it.  I went ahead and explained to the patient the mechanism by which tramadol provides him with relief of the pain, (activation of the mu receptor) and I also explained to him that it also activates the norepinephrine receptor.  RTCB: 11/12/2020  Pharmacotherapy Assessment   Analgesic: Tramadol 50 mg, 1 tab PO BID (100 mg/day of tramadol) MME/day:62m/day.   Monitoring: [place] PMP: PDMP reviewed during this encounter.       Pharmacotherapy: No side-effects or adverse reactions reported. Compliance: No problems identified. Effectiveness: Clinically acceptable.  SHart Rochester RN  08/13/2020  2:26 PM  Sign when Signing Visit Nursing Pain Medication Assessment:  Safety precautions to be maintained throughout the outpatient stay will include: orient to surroundings, keep bed in low position, maintain call bell within reach at all times, provide assistance with  transfer out  of bed and ambulation.  Medication Inspection Compliance: Samuel Roberts did not comply with our request to bring his pills to be counted. He was reminded that bringing the medication bottles, even when empty, is a requirement.  Medication: None brought in. Pill/Patch Count: None available to be counted. Bottle Appearance: No container available. Did not bring bottle(s) to appointment. Filled Date: N/A Last Medication intake:  Today    UDS:  Summary  Date Value Ref Range Status  11/17/2019 Note  Final    Comment:    ==================================================================== [employer] 13 (MW) ==================================================================== Test                             Result       Flag       Units  Drug Present and Declared for Prescription Verification   Tramadol                       >1832        EXPECTED   ng/mg creat   O-Desmethyltramadol            >1832        EXPECTED   ng/mg creat   N-Desmethyltramadol            777          EXPECTED   ng/mg creat    Source of tramadol is a prescription medication. O-desmethyltramadol    and N-desmethyltramadol are expected metabolites of tramadol.  ==================================================================== Test                      Result    Flag   Units      Ref Range   Creatinine              273              mg/dL      >=20 ==================================================================== Declared Medications:  The flagging and interpretation on this report are based on the  following declared medications.  Unexpected results may arise from  inaccuracies in the declared medications.   **Note: The testing scope of this panel includes these medications:   Tramadol (Ultram)   **Note: The testing scope of this panel does not include the  following reported medications:   Finasteride (Propecia)  Mesalamine  Pantoprazole  (Protonix) ==================================================================== For clinical consultation, please call [phone]. ====================================================================      ROS  Constitutional: Denies any fever or chills Gastrointestinal: No reported hemesis, hematochezia, vomiting, or acute GI distress Musculoskeletal: Denies any acute onset joint swelling, redness, loss of ROM, or weakness Neurological: No reported episodes of acute onset apraxia, aphasia, dysarthria, agnosia, amnesia, paralysis, loss of coordination, or loss of consciousness  Medication Review  finasteride, mesalamine, pantoprazole, and traMADol  History Review  Allergy: Samuel Roberts has No Known Allergies. Drug: Samuel Roberts  reports no history of drug use. Alcohol:  reports no history of alcohol use. Tobacco:  reports that he has never smoked. He has never used smokeless tobacco. Social: Samuel Roberts  reports that he has never smoked. He has never used smokeless tobacco. He reports that he does not drink alcohol and does not use drugs. Medical:  has a past medical history of BPH (benign prostatic hyperplasia), Colitis, ulcerative ([place]), GERD (gastroesophageal reflux disease), and Hypertension. Surgical: Samuel Roberts  has a past surgical history that includes Knee arthroscopy and arthrotomy; Tonsillectomy; LEFT HEART  CATH AND CORONARY ANGIOGRAPHY (Left, 09/09/2016); Joint replacement (Right, 02/17/2018); and LEFT HEART CATH AND CORONARY ANGIOGRAPHY (Left, 07/25/2020). Family: family history includes Cancer in his father; Cirrhosis in his brother; Diabetes in his mother, sister, and sister; Drug abuse in his brother and brother; Heart disease in his brother and brother; Heart failure in his father and mother.  Laboratory Chemistry Profile   Renal Lab Results  Component Value Date   BUN 16 07/02/2020   CREATININE 0.74 07/02/2020   GFRAA >60 11/25/2019   GFRNONAA >60 07/02/2020      Hepatic Lab Results  Component Value Date   AST 29 07/02/2020   ALT 20 07/02/2020   ALBUMIN 3.9 07/02/2020   ALKPHOS 43 07/02/2020   LIPASE 27 07/02/2020     Electrolytes Lab Results  Component Value Date   NA 135 07/02/2020   K 3.5 07/02/2020   CL 105 07/02/2020   CALCIUM 8.8 (L) 07/02/2020   MG 2.4 (H) 06/24/2017     Bone Lab Results  Component Value Date   25OHVITD1 37 06/24/2017   25OHVITD2 2.2 06/24/2017   25OHVITD3 35 06/24/2017     Inflammation (CRP: Acute Phase) (ESR: Chronic Phase) Lab Results  Component Value Date   CRP 2.8 06/24/2017   ESRSEDRATE 11 06/24/2017       Note: Above Lab results reviewed.  Recent Imaging Review  CARDIAC CATHETERIZATION  Dist LAD lesion is 50% stenosed.  Mid RCA lesion is 20% stenosed.  Mildly depressed left ventricular function with anterior apical  hypoejection fraction around 45%-50%   Conclusion Outpatient left heart cath from right radial access site Left ventricular function mildly reduced with anteroapical hypokinesis  ejection fraction around 45 to 50% Coronaries Normal left main Normal LAD with distal 25 to 50% diffusely Circumflex without any significant obstructive disease RCA large no significant obstructive disease  Conclusion No significant obstructive coronary disease Reduced left ventricular function with anterior apical hypokinesis Intervention was deferred Recommend conservative medical therapy Note: Reviewed        Physical Exam  General appearance: Well nourished, well developed, and well hydrated. In no apparent acute distress Mental status: Alert, oriented x 3 (person, place, & time)       Respiratory: No evidence of acute respiratory distress Eyes: PERLA Vitals: BP (!) 146/89   Pulse 65   Temp (!) 97.3 F (36.3 C) (Temporal)   Resp 18   Ht 5' 10" (1.778 m)   Wt 200 lb (90.7 kg)   SpO2 98%   BMI 28.70 kg/m  BMI: Estimated body mass index is 28.7 kg/m as calculated from the  following:   Height as of this encounter: 5' 10" (1.778 m).   Weight as of this encounter: 200 lb (90.7 kg). Ideal: Ideal body weight: 73 kg (160 lb 15 oz) Adjusted ideal body weight: 80.1 kg (176 lb 9 oz)  Assessment   Status Diagnosis  Controlled Controlled Controlled 1. Chronic pain syndrome   2. Chronic neck pain (1ry area of Pain) (Bilateral) (L>R)   3. Chronic shoulder pain (2ry area of Pain) (Left)   4. Occipital neuralgia (3ry area of Pain) (Left)   5. Chronic low back pain (4th area of Pain) (Right) without sciatica   6. Chronic knee pain (5th area of Pain) (Bilateral) (R>L)   7. Pharmacologic therapy   8. Chronic use of opiate for therapeutic purpose   9. Uncomplicated opioid dependence ([place])      Updated Problems: Problem  Chronic neck pain (1ry area  of Pain) (Bilateral) (L>R)  Occipital neuralgia (3ry area of Pain) (Left)  Chronic shoulder pain (2ry area of Pain) (Left)  Chronic low back pain (4th area of Pain) (Right) without sciatica  Chronic knee pain (5th area of Pain) (Bilateral) (R>L)  Chronic Use of Opiate for Therapeutic Purpose  Gastroesophageal Reflux Disease Without Esophagitis  Hyperlipidemia, Mixed    Plan of Care  Problem-specific:  No problem-specific Assessment & Plan notes found for this encounter.  Mr. ZED WANNINGER has a current medication list which includes the following long-term medication(s): pantoprazole and [START ON 08/14/2020] tramadol.  Pharmacotherapy (Medications Ordered): Meds ordered this encounter  Medications  . traMADol (ULTRAM) 50 MG tablet    Sig: Take 1 tablet (50 mg total) by mouth 2 (two) times daily. Each refill must last 30 days.    Dispense:  60 tablet    Refill:  2    Not a duplicate. Do NOT delete! Dispense 1 day early if closed on refill date. Avoid benzodiazepines within 8 hours of opioids. Do not send refill requests.   Orders:  No orders of the defined types were placed in this encounter.  Follow-up  plan:   Return in about 3 months (around 11/12/2020) for (F2F), (MM).      Interventional management options:  Considering:   Diagnostic left-sided cervical facet nerve block Possible left-sided cervical facet RFA Diagnosticleft suprascapular nerve block Diagnostictrigger point injection Diagnosticright lumbar facet nerve block Diagnostic right lumbar facet RFA Diagnostic Left knee Hyalgan series Diagnostic Right knee genicular nerve block Possible right knee genicular RFA   Palliative PRN treatment(s):   Diagnostic left-sided CESI #3 (PRN)         Recent Visits No visits were found meeting these conditions. Showing recent visits within past 90 days and meeting all other requirements Today's Visits Date Type Provider Dept  08/13/20 Office Visit Milinda Pointer, MD [facility]  Showing today's visits and meeting all other requirements Future Appointments No visits were found meeting these conditions. Showing future appointments within next 90 days and meeting all other requirements  I discussed the assessment and treatment plan with the patient. The patient was provided an opportunity to ask questions and all were answered. The patient agreed with the plan and demonstrated an understanding of the instructions.  Patient advised to call back or seek an in-person evaluation if the symptoms or condition worsens.  Duration of encounter: 30 minutes.  Note by: Gaspar Cola, MD Date: 08/13/2020; Time: 3:05 PM

## 2020-08-13 ENCOUNTER — Other Ambulatory Visit: Payer: Self-pay

## 2020-08-13 ENCOUNTER — Encounter: Payer: Self-pay | Admitting: Pain Medicine

## 2020-08-13 ENCOUNTER — Ambulatory Visit: Payer: BC Managed Care – PPO | Attending: Pain Medicine | Admitting: Pain Medicine

## 2020-08-13 VITALS — BP 146/89 | HR 65 | Temp 97.3°F | Resp 18 | Ht 70.0 in | Wt 200.0 lb

## 2020-08-13 DIAGNOSIS — G8929 Other chronic pain: Secondary | ICD-10-CM | POA: Diagnosis present

## 2020-08-13 DIAGNOSIS — M545 Low back pain, unspecified: Secondary | ICD-10-CM | POA: Diagnosis present

## 2020-08-13 DIAGNOSIS — M25562 Pain in left knee: Secondary | ICD-10-CM | POA: Diagnosis present

## 2020-08-13 DIAGNOSIS — M542 Cervicalgia: Secondary | ICD-10-CM | POA: Diagnosis present

## 2020-08-13 DIAGNOSIS — M5481 Occipital neuralgia: Secondary | ICD-10-CM | POA: Diagnosis present

## 2020-08-13 DIAGNOSIS — F1120 Opioid dependence, uncomplicated: Secondary | ICD-10-CM | POA: Insufficient documentation

## 2020-08-13 DIAGNOSIS — M25512 Pain in left shoulder: Secondary | ICD-10-CM | POA: Diagnosis present

## 2020-08-13 DIAGNOSIS — Z79891 Long term (current) use of opiate analgesic: Secondary | ICD-10-CM | POA: Diagnosis present

## 2020-08-13 DIAGNOSIS — G894 Chronic pain syndrome: Secondary | ICD-10-CM | POA: Diagnosis present

## 2020-08-13 DIAGNOSIS — Z79899 Other long term (current) drug therapy: Secondary | ICD-10-CM | POA: Diagnosis present

## 2020-08-13 DIAGNOSIS — M25561 Pain in right knee: Secondary | ICD-10-CM | POA: Diagnosis present

## 2020-08-13 MED ORDER — TRAMADOL HCL 50 MG PO TABS: 50.0000 mg | ORAL_TABLET | Freq: Two times a day (BID) | ORAL | 2 refills | Status: DC

## 2020-08-13 NOTE — Patient Instructions (Signed)
____________________________________________________________________________________________  Medication Recommendations and Reminders  Applies to: All patients receiving prescriptions (written and/or electronic).  Medication Rules & Regulations: These rules and regulations exist for your safety and that of others. They are not flexible and neither are we. Dismissing or ignoring them will be considered "non-compliance" with medication therapy, resulting in complete and irreversible termination of such therapy. (See document titled "Medication Rules" for more details.) In all conscience, because of safety reasons, we cannot continue providing a therapy where the patient does not follow instructions.  Pharmacy of record:   Definition: This is the pharmacy where your electronic prescriptions will be sent.   We do not endorse any particular pharmacy, however, we have experienced problems with [facility] not securing enough medication supply for the community.  We do not restrict you in your choice of pharmacy. However, once we write for your prescriptions, we will NOT be re-sending more prescriptions to fix restricted supply problems created by your pharmacy, or your insurance.   The pharmacy listed in the electronic medical record should be the one where you want electronic prescriptions to be sent.  If you choose to change pharmacy, simply notify our nursing staff.  Recommendations:  Keep all of your pain medications in a safe place, under lock and key, even if you live alone. We will NOT replace lost, stolen, or damaged medication.  After you fill your prescription, take 1 week's worth of pills and put them away in a safe place. You should keep a separate, properly labeled bottle for this purpose. The remainder should be kept in the original bottle. Use this as your primary supply, until it runs out. Once it's gone, then you know that you have 1 week's worth of medicine, and it is time to come  in for a prescription refill. If you do this correctly, it is unlikely that you will ever run out of medicine.  To make sure that the above recommendation works, it is very important that you make sure your medication refill appointments are scheduled at least 1 week before you run out of medicine. To do this in an effective manner, make sure that you do not leave the office without scheduling your next medication management appointment. Always ask the nursing staff to show you in your prescription , when your medication will be running out. Then arrange for the receptionist to get you a return appointment, at least 7 days before you run out of medicine. Do not wait until you have 1 or 2 pills left, to come in. This is very poor planning and does not take into consideration that we may need to cancel appointments due to bad weather, sickness, or emergencies affecting our staff.  DO NOT ACCEPT A "Partial Fill": If for any reason your pharmacy does not have enough pills/tablets to completely fill or refill your prescription, do not allow for a "partial fill". The law allows the pharmacy to complete that prescription within 72 hours, without requiring a new prescription. If they do not fill the rest of your prescription within those 72 hours, you will need a separate prescription to fill the remaining amount, which we will NOT provide. If the reason for the partial fill is your insurance, you will need to talk to the pharmacist about payment alternatives for the remaining tablets, but again, DO NOT ACCEPT A PARTIAL FILL, unless you can trust your pharmacist to obtain the remainder of the pills within 72 hours.  Prescription refills and/or changes in medication(s):  Prescription refills, and/or changes in dose or medication, will be conducted only during scheduled medication management appointments. (Applies to both, written and electronic prescriptions.)  No refills on procedure days. No medication will be  changed or started on procedure days. No changes, adjustments, and/or refills will be conducted on a procedure day. Doing so will interfere with the diagnostic portion of the procedure.  No phone refills. No medications will be "called into the pharmacy".  No Fax refills.  No weekend refills.  No Holliday refills.  No after hours refills.  Remember:  Business hours are:  Monday to Thursday 8:00 AM to 4:00 PM Provider's Schedule: Milinda Pointer, MD - Appointments are:  Medication management: Monday and Wednesday 8:00 AM to 4:00 PM Procedure day: Tuesday and Thursday 7:30 AM to 4:00 PM Gillis Santa, MD - Appointments are:  Medication management: Tuesday and Thursday 8:00 AM to 4:00 PM Procedure day: Monday and Wednesday 7:30 AM to 4:00 PM (Last update: 11/09/2019) ____________________________________________________________________________________________   ____________________________________________________________________________________________  Medication Rules  Purpose: To inform patients, and their family members, of our rules and regulations.  Applies to: All patients receiving prescriptions (written or electronic).  Pharmacy of record: Pharmacy where electronic prescriptions will be sent. If written prescriptions are taken to a different pharmacy, please inform the nursing staff. The pharmacy listed in the electronic medical record should be the one where you would like electronic prescriptions to be sent.  Electronic prescriptions: In compliance with the [place] (STOP) Act of 2017 (Session Lanny Cramp [phone]), effective April 21, 2018, all controlled substances must be electronically prescribed. Calling prescriptions to the pharmacy will cease to exist.  Prescription refills: Only during scheduled appointments. Applies to all prescriptions.  NOTE: The following applies primarily to controlled substances (Opioid* Pain  Medications).   Type of encounter (visit): For patients receiving controlled substances, face-to-face visits are required. (Not an option or up to the patient.)  Patient's responsibilities: 1. Pain Pills: Bring all pain pills to every appointment (except for procedure appointments). 2. Pill Bottles: Bring pills in original pharmacy bottle. Always bring the newest bottle. Bring bottle, even if empty. 3. Medication refills: You are responsible for knowing and keeping track of what medications you take and those you need refilled. The day before your appointment: write a list of all prescriptions that need to be refilled. The day of the appointment: give the list to the admitting nurse. Prescriptions will be written only during appointments. No prescriptions will be written on procedure days. If you forget a medication: it will not be "Called in", "Faxed", or "electronically sent". You will need to get another appointment to get these prescribed. No early refills. Do not call asking to have your prescription filled early. 4. Prescription Accuracy: You are responsible for carefully inspecting your prescriptions before leaving our office. Have the discharge nurse carefully go over each prescription with you, before taking them home. Make sure that your name is accurately spelled, that your address is correct. Check the name and dose of your medication to make sure it is accurate. Check the number of pills, and the written instructions to make sure they are clear and accurate. Make sure that you are given enough medication to last until your next medication refill appointment. 5. Taking Medication: Take medication as prescribed. When it comes to controlled substances, taking less pills or less frequently than prescribed is permitted and encouraged. Never take more pills than instructed. Never take medication more frequently than prescribed.  6.  Inform other Doctors: Always inform, all of your healthcare  providers, of all the medications you take. 7. Pain Medication from other Providers: You are not allowed to accept any additional pain medication from any other Doctor or Healthcare provider. There are two exceptions to this rule. (see below) In the event that you require additional pain medication, you are responsible for notifying us, as stated below. 8. Cough Medicine: Often these contain an opioid, such as codeine or hydrocodone. Never accept or take cough medicine containing these opioids if you are already taking an opioid* medication. The combination may cause respiratory failure and death. 9. Medication Agreement: You are responsible for carefully reading and following our Medication Agreement. This must be signed before receiving any prescriptions from our practice. Safely store a copy of your signed Agreement. Violations to the Agreement will result in no further prescriptions. (Additional copies of our Medication Agreement are available upon request.) 10. Laws, Rules, & Regulations: All patients are expected to follow all Federal and [employer], [employer], Rules, [employer] [facility]. Ignorance of the Laws does not constitute a valid excuse.  11. Illegal drugs and Controlled Substances: The use of illegal substances (including, but not limited to marijuana and its derivatives) and/or the illegal use of any controlled substances is strictly prohibited. Violation of this rule may result in the immediate and permanent discontinuation of any and all prescriptions being written by our practice. The use of any illegal substances is prohibited. 12. Adopted CDC guidelines & recommendations: Target dosing levels will be at or below 60 MME/day. Use of benzodiazepines** is not recommended.  Exceptions: There are only two exceptions to the rule of not receiving pain medications from other Healthcare Providers. 1. Exception #1 (Emergencies): In the event of an emergency (i.e.: accident requiring emergency care), you  are allowed to receive additional pain medication. However, you are responsible for: As soon as you are able, call our office [phone], at any time of the day or night, and leave a message stating your name, the date and nature of the emergency, and the name and dose of the medication prescribed. In the event that your call is answered by a member of our staff, make sure to document and save the date, time, and the name of the person that took your information.  2. Exception #2 (Planned Surgery): In the event that you are scheduled by another doctor or dentist to have any type of surgery or procedure, you are allowed (for a period no longer than 30 days), to receive additional pain medication, for the acute post-op pain. However, in this case, you are responsible for picking up a copy of our "Post-op Pain Management for Surgeons" handout, and giving it to your surgeon or dentist. This document is available at our office, and does not require an appointment to obtain it. Simply go to our office during business hours (Monday-Thursday from 8:00 AM to 4:00 PM) (Friday 8:00 AM to 12:00 Noon) or if you have a scheduled appointment with [place], prior to your surgery, and ask for it by name. In addition, you are responsible for: calling our office [phone], at any time of the day or night, and leaving a message stating your name, name of your surgeon, type of surgery, and date of procedure or surgery. Failure to comply with your responsibilities may result in termination of therapy involving the controlled substances.  *Opioid medications include: morphine, codeine, oxycodone, oxymorphone, hydrocodone, hydromorphone, meperidine, tramadol, tapentadol, buprenorphine, fentanyl, methadone. **Benzodiazepine medications include:  diazepam (Valium), alprazolam (Xanax), clonazepam (Klonopine), lorazepam (Ativan), clorazepate (Tranxene), chlordiazepoxide (Librium), estazolam (Prosom), oxazepam (Serax), temazepam  (Restoril), triazolam (Halcion) (Last updated: 03/19/2020) ____________________________________________________________________________________________   ____________________________________________________________________________________________  CBD (cannabidiol) WARNING  Applicable to: All individuals currently taking or considering taking CBD (cannabidiol) and, more important, all patients taking opioid analgesic controlled substances (pain medication). (Example: oxycodone; oxymorphone; hydrocodone; hydromorphone; morphine; methadone; tramadol; tapentadol; fentanyl; buprenorphine; butorphanol; dextromethorphan; meperidine; codeine; etc.)  Legal status: CBD remains a Schedule I drug prohibited for any use. CBD is illegal with one exception. In the [place], CBD has a limited Transport planner (FDA) approval for the treatment of two specific types of epilepsy disorders. Only one CBD product has been approved by the FDA for this purpose: "Epidiolex". FDA is aware that some companies are marketing products containing cannabis and cannabis-derived compounds in ways that violate the [employer], Drug and Cosmetic Act [facility] Act) and that may put the health and safety of consumers at risk. The FDA, a Federal agency, has not enforced the CBD status since 2018.   Legality: Some manufacturers ship CBD products nationally, which is illegal. Often such products are sold online and are therefore available throughout the country. CBD is openly sold in head shops and health food stores in some states where such sales have not been explicitly legalized. Selling unapproved products with unsubstantiated therapeutic claims is not only a violation of the law, but also can put patients at risk, as these products have not been proven to be safe or effective. Federal illegality makes it difficult to conduct research on CBD.  Reference: "FDA Regulation of Cannabis and Cannabis-Derived Products, Including  Cannabidiol (CBD)" - [employer]  Warning: CBD is not FDA approved and has not undergo the same manufacturing controls as prescription drugs.  This means that the purity and safety of available CBD may be questionable. Most of the time, despite manufacturer's claims, it is contaminated with THC (delta-9-tetrahydrocannabinol - the chemical in marijuana responsible for the "HIGH").  When this is the case, the [facility] contaminant will trigger a positive urine drug screen (UDS) test for Marijuana (carboxy-THC). Because a positive UDS for any illicit substance is a violation of our medication agreement, your opioid analgesics (pain medicine) may be permanently discontinued.  MORE ABOUT CBD  General Information: CBD  is a derivative of the Marijuana (cannabis sativa) plant discovered in 37. It is one of the 113 identified substances found in Marijuana. It accounts for up to 40% of the plant's extract. As of 2018, preliminary clinical studies on CBD included research for the treatment of anxiety, movement disorders, and pain. CBD is available and consumed in multiple forms, including inhalation of smoke or vapor, as an aerosol spray, and by mouth. It may be supplied as an oil containing CBD, capsules, dried cannabis, or as a liquid solution. CBD is thought not to be as psychoactive as THC (delta-9-tetrahydrocannabinol - the chemical in marijuana responsible for the "HIGH"). Studies suggest that CBD may interact with different biological target receptors in the body, including cannabinoid and other neurotransmitter receptors. As of 2018 the mechanism of action for its biological effects has not been determined.  Side-effects  Adverse reactions: Dry mouth, diarrhea, decreased appetite, fatigue, drowsiness, malaise, weakness, sleep disturbances, and others.  Drug interactions: CBC may interact with  other medications such as blood-thinners. (Last update: 11/26/2019) ____________________________________________________________________________________________

## 2020-08-13 NOTE — Progress Notes (Signed)
Nursing Pain Medication Assessment:  Safety precautions to be maintained throughout the outpatient stay will include: orient to surroundings, keep bed in low position, maintain call bell within reach at all times, provide assistance with transfer out of bed and ambulation.  Medication Inspection Compliance: Mr. Perra did not comply with our request to bring his pills to be counted. He was reminded that bringing the medication bottles, even when empty, is a requirement.  Medication: None brought in. Pill/Patch Count: None available to be counted. Bottle Appearance: No container available. Did not bring bottle(s) to appointment. Filled Date: N/A Last Medication intake:  Today

## 2020-08-21 DIAGNOSIS — F410 Panic disorder [episodic paroxysmal anxiety] without agoraphobia: Secondary | ICD-10-CM | POA: Insufficient documentation

## 2020-10-05 DIAGNOSIS — F419 Anxiety disorder, unspecified: Secondary | ICD-10-CM | POA: Insufficient documentation

## 2020-11-12 ENCOUNTER — Encounter: Payer: BC Managed Care – PPO | Admitting: Pain Medicine

## 2020-11-12 NOTE — Progress Notes (Signed)
PROVIDER NOTE: Information contained herein reflects review and annotations entered in association with encounter. Interpretation of such information and data should be left to medically-trained personnel. Information provided to patient can be located elsewhere in the medical record under "Patient Instructions". Document created using STT-dictation technology, any transcriptional errors that may result from process are unintentional.    Patient: Samuel Roberts  Service Category: E/M  Provider: Gaspar Cola, MD  DOB: 05/02/1955  DOS: 11/13/2020  Specialty: Interventional Pain Management  MRN: [id number]  Setting: Ambulatory outpatient  PCP: [name], MD  Type: Established Patient    Referring Provider: [name], MD  Location: Office  Delivery: Face-to-face     HPI  [name], a 65 y.o. year old male, is here today because of his Chronic pain syndrome [G89.4]. [name] primary complain today is Back Pain Last encounter: My last encounter with him was on 08/13/2020. Pertinent problems: 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 Pain Assessment: Severity of Chronic pain is reported as a 5 /10. Location: Back Left, Right/Denies. Onset: More than a month ago. Quality: Aching, Stabbing, Constant. Timing: Constant. Modifying factor(s): Meds, Heating , and laying, stretching. Vitals:  height is _0  (1.778 m) and weight is 200 lb (90.7 kg). His temporal temperature is 97 F (36.1 C) (abnormal). His blood pressure is 138/90 and his pulse is 83. His respiration is 16 and oxygen saturation is 98%.   Reason for encounter: medication management.   The patient indicates doing well with the current medication regimen. No adverse reactions or side effects reported to the medications.   UDS ordered today.   RTCB: 05/12/2021  Pharmacotherapy Assessment  Analgesic: Tramadol 50 mg, 1 tab PO BID (100 mg/day of tramadol) MME/day: 10 mg/day.   Monitoring: Portal PMP: PDMP reviewed during this encounter.       Pharmacotherapy: No side-effects or adverse reactions reported. Compliance: No problems identified. Effectiveness: Clinically acceptable.  Chauncey Fischer, RN  11/13/2020  1:54 PM  Sign when Signing Visit Nursing Pain Medication Assessment:  Safety precautions to be maintained throughout the outpatient stay will include: orient to surroundings, keep bed in low position, maintain call bell within reach at all times, provide assistance with transfer out of bed and ambulation.  Medication Inspection Compliance: Pill count conducted under aseptic conditions, in front of the patient. Neither the pills nor the bottle was removed from the patient's sight at any time. Once count was completed pills were immediately returned to the patient in their original bottle.  Medication: See above Pill/Patch Count:  0 of 60 pills remain Pill/Patch Appearance: Markings consistent with prescribed medication Bottle Appearance:  Standard pharmacy container. Clearly labeled. Filled Date: 6 / 76 /  2022 Last Medication intake:  Today Safety precautions to be maintained throughout the outpatient stay will include: orient to surroundings, keep bed in low position, maintain call bell within reach at all times, provide assistance with transfer out of bed and ambulation.    Pt is currently on antibiotic to throat infection.     UDS:  Summary  Date Value Ref Range Status  11/17/2019 Note  Final    Comment:    ==================================================================== Tox[employer] Select 13 (MW) ==================================================================== Test                             Result       Flag       Units  Drug Present and Declared for Prescription Verification   Tramadol                       >1832        EXPECTED   ng/mg creat   O-Desmethyltramadol            >1832        EXPECTED   ng/mg creat   N-Desmethyltramadol            777          EXPECTED   ng/mg creat    Source of tramadol is a prescription medication. O-desmethyltramadol    and N-desmethyltramadol are expected metabolites of tramadol.  ==================================================================== Test                      Result    Flag   Units      Ref Range   Creatinine              273              mg/dL      >=20 ==================================================================== Declared Medications:  The flagging and interpretation on this report are based on the  following declared medications.  Unexpected results may arise from  inaccuracies in the declared medications.   **Note: The testing scope of this panel includes these medications:   Tramadol (Ultram)   **Note: The testing scope of this panel does not include the  following reported medications:   Finasteride (Propecia)  Mesalamine  Pantoprazole (Protonix) ==================================================================== For clinical  consultation, please call [phone]. ====================================================================      ROS  Constitutional: Denies any fever or chills Gastrointestinal: No reported hemesis, hematochezia, vomiting, or acute GI distress Musculoskeletal: Denies any acute onset joint swelling, redness, loss of ROM, or weakness Neurological: No reported episodes of acute onset apraxia, aphasia, dysarthria, agnosia, amnesia, paralysis, loss of coordination, or loss of consciousness  Medication Review  finasteride, mesalamine, pantoprazole, and traMADol  History Review  Allergy: Mr. Kovaleski has No Known Allergies. Drug: Mr. Guidotti  reports no history of drug use. Alcohol:  reports no history of alcohol use. Tobacco:  reports that he has never smoked. He has never used smokeless tobacco. Social: Mr. Branscome  reports that he has never smoked. He has never used smokeless tobacco. He reports that he does not drink alcohol and does not use drugs. 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 Surgical: Mr. Mcgaha  has a past surgical history that includes Knee arthroscopy and arthrotomy; Tonsillectomy; LEFT HEART CATH AND CORONARY ANGIOGRAPHY (Left, 09/09/2016); Joint replacement (Right, 02/17/2018); and LEFT HEART CATH  AND CORONARY ANGIOGRAPHY (Left, 07/25/2020). Family: family history includes Cancer in his father; Cirrhosis in his brother; Diabetes in his mother, sister, and sister; Drug abuse in his brother and brother; Heart disease in his brother and brother; Heart failure in his father and mother.  Laboratory Chemistry Profile   Renal Lab Results  Component Value Date   BUN 16 07/02/2020   CREATININE 0.74 07/02/2020   GFRAA >60 11/25/2019   GFRNONAA >60 07/02/2020    Hepatic Lab Results  Component Value Date   AST 29 07/02/2020   ALT 20 07/02/2020   ALBUMIN 3.9  07/02/2020   ALKPHOS 43 07/02/2020   LIPASE 27 07/02/2020    Electrolytes Lab Results  Component Value Date   NA 135 07/02/2020   K 3.5 07/02/2020   CL 105 07/02/2020   CALCIUM 8.8 (L) 07/02/2020   MG 2.4 (H) 06/24/2017    Bone Lab Results  Component Value Date   25OHVITD1 37 06/24/2017   25OHVITD2 2.2 06/24/2017   25OHVITD3 35 06/24/2017    Inflammation (CRP: Acute Phase) (ESR: Chronic Phase) Lab Results  Component Value Date   CRP 2.8 06/24/2017   ESRSEDRATE 11 06/24/2017         Note: Above Lab results reviewed.  Recent Imaging Review  CARDIAC CATHETERIZATION  Dist LAD lesion is 50% stenosed.  Mid RCA lesion is 20% stenosed.  Mildly depressed left ventricular function with anterior apical  hypoejection fraction around 45%-50%   Conclusion Outpatient left heart cath from right radial access site Left ventricular function mildly reduced with anteroapical hypokinesis  ejection fraction around 45 to 50% Coronaries Normal left main Normal LAD with distal 25 to 50% diffusely Circumflex without any significant obstructive disease RCA large no significant obstructive disease  Conclusion No significant obstructive coronary disease Reduced left ventricular function with anterior apical hypokinesis Intervention was deferred Recommend conservative medical therapy Note: Reviewed        Physical Exam  General appearance: Well nourished, well developed, and well hydrated. In no apparent acute distress Mental status: Alert, oriented x 3 (person, place, & time)       Respiratory: No evidence of acute respiratory distress Eyes: PERLA Vitals: BP 138/90 (BP Location: Right Arm, Patient Position: Sitting, Cuff Size: Normal)   Pulse 83   Temp (!) 97 F (36.1 C) (Temporal)   Resp 16   Ht _0  (1.778 m)   Wt 200 lb (90.7 kg)   SpO2 98%   BMI 28.70 kg/m  BMI: Estimated body mass index is 28.7 kg/m as calculated from the following:   Height as of this encounter: 5'  10" (1.778 m).   Weight as of this encounter: 200 lb (90.7 kg). Ideal: Ideal body weight: 73 kg (160 lb 15 oz) Adjusted ideal body weight: 80.1 kg (176 lb 9 oz)  Assessment   Status Diagnosis  Controlled Controlled Controlled 1. Chronic pain syndrome   2. Pharmacologic therapy   3. Chronic use of opiate for therapeutic purpose   4. Encounter for medication management      Updated Problems: No problems updated.  Plan of Care  Problem-specific:  No problem-specific Assessment & Plan notes found for this encounter.  Mr. JIBRIL MCMINN has a current medication list which includes the following long-term medication(s): pantoprazole and tramadol.  Pharmacotherapy (Medications Ordered): Meds ordered this encounter  Medications   traMADol (ULTRAM) 50 MG tablet    Sig: Take 1 tablet (50 mg total) by mouth 2 (two) times daily. Each  refill must last 30 days.    Dispense:  60 tablet    Refill:  5    Not a duplicate. Do NOT delete! Dispense 1 day early if closed on refill date. Avoid benzodiazepines within 8 hours of opioids. Do not send refill requests.    Orders:  Orders Placed This Encounter  Procedures   ToxASSURE Select 13 (MW), Urine    Volume: 30 ml(s). Minimum 3 ml of urine is needed. Document temperature of fresh sample. Indications: Long term (current) use of opiate analgesic (X65.537)    Order Specific Question:   Release to patient    Answer:   Immediate    Follow-up plan:   Return in about 6 months (around 05/12/2021) for evaluation day (F2F) (MM).     Interventional management options:  Considering:   Diagnostic left-sided cervical facet nerve block  Possible left-sided cervical facet RFA  Diagnostic left suprascapular nerve block  Diagnostic trigger point injection  Diagnostic right lumbar facet nerve block  Diagnostic right lumbar facet RFA  Diagnostic Left knee Hyalgan series  Diagnostic Right knee genicular nerve block  Possible right knee genicular RFA     Palliative PRN treatment(s):   Diagnostic left-sided CESI #3 (PRN)    Recent Visits No visits were found meeting these conditions. Showing recent visits within past 90 days and meeting all other requirements Today's Visits Date Type Provider Dept  11/13/20 Office Visit Milinda Pointer, MD [facility]  Showing today's visits and meeting all other requirements Future Appointments No visits were found meeting these conditions. Showing future appointments within next 90 days and meeting all other requirements I discussed the assessment and treatment plan with the patient. The patient was provided an opportunity to ask questions and all were answered. The patient agreed with the plan and demonstrated an understanding of the instructions.  Patient advised to call back or seek an in-person evaluation if the symptoms or condition worsens.  Duration of encounter: 30 minutes.  Note by: Gaspar Cola, MD Date: 11/13/2020; Time: 6:01 PM

## 2020-11-13 ENCOUNTER — Other Ambulatory Visit: Payer: Self-pay

## 2020-11-13 ENCOUNTER — Ambulatory Visit: Payer: BC Managed Care – PPO | Attending: Pain Medicine | Admitting: Pain Medicine

## 2020-11-13 ENCOUNTER — Encounter: Payer: Self-pay | Admitting: Pain Medicine

## 2020-11-13 VITALS — BP 138/90 | HR 83 | Temp 97.0°F | Resp 16 | Ht 70.0 in | Wt 200.0 lb

## 2020-11-13 DIAGNOSIS — Z79899 Other long term (current) drug therapy: Secondary | ICD-10-CM | POA: Insufficient documentation

## 2020-11-13 DIAGNOSIS — Z79891 Long term (current) use of opiate analgesic: Secondary | ICD-10-CM | POA: Insufficient documentation

## 2020-11-13 DIAGNOSIS — G894 Chronic pain syndrome: Secondary | ICD-10-CM | POA: Diagnosis present

## 2020-11-13 MED ORDER — TRAMADOL HCL 50 MG PO TABS: 50.0000 mg | ORAL_TABLET | Freq: Two times a day (BID) | ORAL | 5 refills | Status: DC

## 2020-11-13 NOTE — Progress Notes (Signed)
Nursing Pain Medication Assessment:  Safety precautions to be maintained throughout the outpatient stay will include: orient to surroundings, keep bed in low position, maintain call bell within reach at all times, provide assistance with transfer out of bed and ambulation.  Medication Inspection Compliance: Pill count conducted under aseptic conditions, in front of the patient. Neither the pills nor the bottle was removed from the patient's sight at any time. Once count was completed pills were immediately returned to the patient in their original bottle.  Medication: See above Pill/Patch Count:  0 of 60 pills remain Pill/Patch Appearance: Markings consistent with prescribed medication Bottle Appearance: Standard pharmacy container. Clearly labeled. Filled Date: 6 / 25 / 2022 Last Medication intake:  Today Safety precautions to be maintained throughout the outpatient stay will include: orient to surroundings, keep bed in low position, maintain call bell within reach at all times, provide assistance with transfer out of bed and ambulation.    Pt is currently on antibiotic to throat infection.

## 2020-11-13 NOTE — Patient Instructions (Signed)
____________________________________________________________________________________________  Medication Rules  Purpose: To inform patients, and their family members, of our rules and regulations.  Applies to: All patients receiving prescriptions (written or electronic).  Pharmacy of record: Pharmacy where electronic prescriptions will be sent. If written prescriptions are taken to a different pharmacy, please inform the nursing staff. The pharmacy listed in the electronic medical record should be the one where you would like electronic prescriptions to be sent.  Electronic prescriptions: In compliance with the [place] Strengthen Opioid Misuse Prevention (STOP) Act of 2017 (Session Law 2017-74/H243), effective April 21, 2018, all controlled substances must be electronically prescribed. Calling prescriptions to the pharmacy will cease to exist.  Prescription refills: Only during scheduled appointments. Applies to all prescriptions.  NOTE: The following applies primarily to controlled substances (Opioid* Pain Medications).   Type of encounter (visit): For patients receiving controlled substances, face-to-face visits are required. (Not an option or up to the patient.)  Patient's responsibilities: Pain Pills: Bring all pain pills to every appointment (except for procedure appointments). Pill Bottles: Bring pills in original pharmacy bottle. Always bring the newest bottle. Bring bottle, even if empty. Medication refills: You are responsible for knowing and keeping track of what medications you take and those you need refilled. The day before your appointment: write a list of all prescriptions that need to be refilled. The day of the appointment: give the list to the admitting nurse. Prescriptions will be written only during appointments. No prescriptions will be written on procedure days. If you forget a medication: it will not be "Called in", "Faxed", or "electronically sent". You will  need to get another appointment to get these prescribed. No early refills. Do not call asking to have your prescription filled early. Prescription Accuracy: You are responsible for carefully inspecting your prescriptions before leaving our office. Have the discharge nurse carefully go over each prescription with you, before taking them home. Make sure that your name is accurately spelled, that your address is correct. Check the name and dose of your medication to make sure it is accurate. Check the number of pills, and the written instructions to make sure they are clear and accurate. Make sure that you are given enough medication to last until your next medication refill appointment. Taking Medication: Take medication as prescribed. When it comes to controlled substances, taking less pills or less frequently than prescribed is permitted and encouraged. Never take more pills than instructed. Never take medication more frequently than prescribed.  Inform other Doctors: Always inform, all of your healthcare providers, of all the medications you take. Pain Medication from other Providers: You are not allowed to accept any additional pain medication from any other Doctor or Healthcare provider. There are two exceptions to this rule. (see below) In the event that you require additional pain medication, you are responsible for notifying us, as stated below. Cough Medicine: Often these contain an opioid, such as codeine or hydrocodone. Never accept or take cough medicine containing these opioids if you are already taking an opioid* medication. The combination may cause respiratory failure and death. Medication Agreement: You are responsible for carefully reading and following our Medication Agreement. This must be signed before receiving any prescriptions from our practice. Safely store a copy of your signed Agreement. Violations to the Agreement will result in no further prescriptions. (Additional copies of our  Medication Agreement are available upon request.) Laws, Rules, & Regulations: All patients are expected to follow all Federal and State Laws, Statutes, Rules, & Regulations. Ignorance of   the Laws does not constitute a valid excuse.  Illegal drugs and Controlled Substances: The use of illegal substances (including, but not limited to marijuana and its derivatives) and/or the illegal use of any controlled substances is strictly prohibited. Violation of this rule may result in the immediate and permanent discontinuation of any and all prescriptions being written by our practice. The use of any illegal substances is prohibited. Adopted CDC guidelines & recommendations: Target dosing levels will be at or below 60 MME/day. Use of benzodiazepines** is not recommended.  Exceptions: There are only two exceptions to the rule of not receiving pain medications from other Healthcare Providers. Exception #1 (Emergencies): In the event of an emergency (i.e.: accident requiring emergency care), you are allowed to receive additional pain medication. However, you are responsible for: As soon as you are able, call our office [phone], at any time of the day or night, and leave a message stating your name, the date and nature of the emergency, and the name and dose of the medication prescribed. In the event that your call is answered by a member of our staff, make sure to document and save the date, time, and the name of the person that took your information.  Exception #2 (Planned Surgery): In the event that you are scheduled by another doctor or dentist to have any type of surgery or procedure, you are allowed (for a period no longer than 30 days), to receive additional pain medication, for the acute post-op pain. However, in this case, you are responsible for picking up a copy of our "Post-op Pain Management for Surgeons" handout, and giving it to your surgeon or dentist. This document is available at our office, and  does not require an appointment to obtain it. Simply go to our office during business hours (Monday-Thursday from 8:00 AM to 4:00 PM) (Friday 8:00 AM to 12:00 Noon) or if you have a scheduled appointment with us, prior to your surgery, and ask for it by name. In addition, you are responsible for: calling our office [phone], at any time of the day or night, and leaving a message stating your name, name of your surgeon, type of surgery, and date of procedure or surgery. Failure to comply with your responsibilities may result in termination of therapy involving the controlled substances.  *Opioid medications include: morphine, codeine, oxycodone, oxymorphone, hydrocodone, hydromorphone, meperidine, tramadol, tapentadol, buprenorphine, fentanyl, methadone. **Benzodiazepine medications include: diazepam (Valium), alprazolam (Xanax), clonazepam (Klonopine), lorazepam (Ativan), clorazepate (Tranxene), chlordiazepoxide (Librium), estazolam (Prosom), oxazepam (Serax), temazepam (Restoril), triazolam (Halcion) (Last updated: 03/19/2020) ____________________________________________________________________________________________  ____________________________________________________________________________________________  Medication Recommendations and Reminders  Applies to: All patients receiving prescriptions (written and/or electronic).  Medication Rules & Regulations: These rules and regulations exist for your safety and that of others. They are not flexible and neither are we. Dismissing or ignoring them will be considered "non-compliance" with medication therapy, resulting in complete and irreversible termination of such therapy. (See document titled "Medication Rules" for more details.) In all conscience, because of safety reasons, we cannot continue providing a therapy where the patient does not follow instructions.  Pharmacy of record:  Definition: This is the pharmacy where your electronic  prescriptions will be sent.  We do not endorse any particular pharmacy, however, we have experienced problems with [facility] not securing enough medication supply for the community. We do not restrict you in your choice of pharmacy. However, once we write for your prescriptions, we will NOT be re-sending more prescriptions to fix restricted supply problems   created by your pharmacy, or your insurance.  The pharmacy listed in the electronic medical record should be the one where you want electronic prescriptions to be sent. If you choose to change pharmacy, simply notify our nursing staff.  Recommendations: Keep all of your pain medications in a safe place, under lock and key, even if you live alone. We will NOT replace lost, stolen, or damaged medication. After you fill your prescription, take 1 week's worth of pills and put them away in a safe place. You should keep a separate, properly labeled bottle for this purpose. The remainder should be kept in the original bottle. Use this as your primary supply, until it runs out. Once it's gone, then you know that you have 1 week's worth of medicine, and it is time to come in for a prescription refill. If you do this correctly, it is unlikely that you will ever run out of medicine. To make sure that the above recommendation works, it is very important that you make sure your medication refill appointments are scheduled at least 1 week before you run out of medicine. To do this in an effective manner, make sure that you do not leave the office without scheduling your next medication management appointment. Always ask the nursing staff to show you in your prescription , when your medication will be running out. Then arrange for the receptionist to get you a return appointment, at least 7 days before you run out of medicine. Do not wait until you have 1 or 2 pills left, to come in. This is very poor planning and does not take into consideration that we may need to  cancel appointments due to bad weather, sickness, or emergencies affecting our staff. DO NOT ACCEPT A "Partial Fill": If for any reason your pharmacy does not have enough pills/tablets to completely fill or refill your prescription, do not allow for a "partial fill". The law allows the pharmacy to complete that prescription within 72 hours, without requiring a new prescription. If they do not fill the rest of your prescription within those 72 hours, you will need a separate prescription to fill the remaining amount, which we will NOT provide. If the reason for the partial fill is your insurance, you will need to talk to the pharmacist about payment alternatives for the remaining tablets, but again, DO NOT ACCEPT A PARTIAL FILL, unless you can trust your pharmacist to obtain the remainder of the pills within 72 hours.  Prescription refills and/or changes in medication(s):  Prescription refills, and/or changes in dose or medication, will be conducted only during scheduled medication management appointments. (Applies to both, written and electronic prescriptions.) No refills on procedure days. No medication will be changed or started on procedure days. No changes, adjustments, and/or refills will be conducted on a procedure day. Doing so will interfere with the diagnostic portion of the procedure. No phone refills. No medications will be "called into the pharmacy". No Fax refills. No weekend refills. No Holliday refills. No after hours refills.  Remember:  Business hours are:  Monday to Thursday 8:00 AM to 4:00 PM Provider's Schedule: Avonte Sensabaugh, MD - Appointments are:  Medication management: Monday and Wednesday 8:00 AM to 4:00 PM Procedure day: Tuesday and Thursday 7:30 AM to 4:00 PM Bilal Lateef, MD - Appointments are:  Medication management: Tuesday and Thursday 8:00 AM to 4:00 PM Procedure day: Monday and Wednesday 7:30 AM to 4:00 PM (Last update:  11/09/2019) ____________________________________________________________________________________________  ____________________________________________________________________________________________  CBD (cannabidiol) WARNING    Applicable to: All individuals currently taking or considering taking CBD (cannabidiol) and, more important, all patients taking opioid analgesic controlled substances (pain medication). (Example: oxycodone; oxymorphone; hydrocodone; hydromorphone; morphine; methadone; tramadol; tapentadol; fentanyl; buprenorphine; butorphanol; dextromethorphan; meperidine; codeine; etc.)  Legal status: CBD remains a Schedule I drug prohibited for any use. CBD is illegal with one exception. In the [place], CBD has a limited Food and Drug Administration (FDA) approval for the treatment of two specific types of epilepsy disorders. Only one CBD product has been approved by the FDA for this purpose: "Epidiolex". FDA is aware that some companies are marketing products containing cannabis and cannabis-derived compounds in ways that violate the Federal Food, Drug and Cosmetic Act (FD&C Act) and that may put the health and safety of consumers at risk. The FDA, a Federal agency, has not enforced the CBD status since 2018.   Legality: Some manufacturers ship CBD products nationally, which is illegal. Often such products are sold online and are therefore available throughout the country. CBD is openly sold in head shops and health food stores in some states where such sales have not been explicitly legalized. Selling unapproved products with unsubstantiated therapeutic claims is not only a violation of the law, but also can put patients at risk, as these products have not been proven to be safe or effective. Federal illegality makes it difficult to conduct research on CBD.  Reference: "FDA Regulation of Cannabis and Cannabis-Derived Products, Including Cannabidiol (CBD)" -  https://www.fda.gov/news-events/public-health-focus/fda-regulation-cannabis-and-cannabis-derived-products-including-cannabidiol-cbd  Warning: CBD is not FDA approved and has not undergo the same manufacturing controls as prescription drugs.  This means that the purity and safety of available CBD may be questionable. Most of the time, despite manufacturer's claims, it is contaminated with THC (delta-9-tetrahydrocannabinol - the chemical in marijuana responsible for the "HIGH").  When this is the case, the THC contaminant will trigger a positive urine drug screen (UDS) test for Marijuana (carboxy-THC). Because a positive UDS for any illicit substance is a violation of our medication agreement, your opioid analgesics (pain medicine) may be permanently discontinued.  MORE ABOUT CBD  General Information: CBD  is a derivative of the Marijuana (cannabis sativa) plant discovered in 1940. It is one of the 113 identified substances found in Marijuana. It accounts for up to 40% of the plant's extract. As of 2018, preliminary clinical studies on CBD included research for the treatment of anxiety, movement disorders, and pain. CBD is available and consumed in multiple forms, including inhalation of smoke or vapor, as an aerosol spray, and by mouth. It may be supplied as an oil containing CBD, capsules, dried cannabis, or as a liquid solution. CBD is thought not to be as psychoactive as THC (delta-9-tetrahydrocannabinol - the chemical in marijuana responsible for the "HIGH"). Studies suggest that CBD may interact with different biological target receptors in the body, including cannabinoid and other neurotransmitter receptors. As of 2018 the mechanism of action for its biological effects has not been determined.  Side-effects  Adverse reactions: Dry mouth, diarrhea, decreased appetite, fatigue, drowsiness, malaise, weakness, sleep disturbances, and others.  Drug interactions: CBC may interact with other medications  such as blood-thinners. (Last update: 11/26/2019) ____________________________________________________________________________________________  ____________________________________________________________________________________________  Drug Holidays (Slow)  What is a "Drug Holiday"? Drug Holiday: is the name given to the period of time during which a patient stops taking a medication(s) for the purpose of eliminating tolerance to the drug.  Benefits Improved effectiveness of opioids. Decreased opioid dose needed to achieve benefits. Improved pain with lesser dose.    What is tolerance? Tolerance: is the progressive decreased in effectiveness of a drug due to its repetitive use. With repetitive use, the body gets use to the medication and as a consequence, it loses its effectiveness. This is a common problem seen with opioid pain medications. As a result, a larger dose of the drug is needed to achieve the same effect that used to be obtained with a smaller dose.  How long should a "Drug Holiday" last? You should stay off of the pain medicine for at least 14 consecutive days. (2 weeks)  Should I stop the medicine "cold turkey"? No. You should always coordinate with your Pain Specialist so that he/she can provide you with the correct medication dose to make the transition as smoothly as possible.  How do I stop the medicine? Slowly. You will be instructed to decrease the daily amount of pills that you take by one (1) pill every seven (7) days. This is called a "slow downward taper" of your dose. For example: if you normally take four (4) pills per day, you will be asked to drop this dose to three (3) pills per day for seven (7) days, then to two (2) pills per day for seven (7) days, then to one (1) per day for seven (7) days, and at the end of those last seven (7) days, this is when the "Drug Holiday" would start.   Will I have withdrawals? By doing a "slow downward taper" like this one, it  is unlikely that you will experience any significant withdrawal symptoms. Typically, what triggers withdrawals is the sudden stop of a high dose opioid therapy. Withdrawals can usually be avoided by slowly decreasing the dose over a prolonged period of time. If you do not follow these instructions and decide to stop your medication abruptly, withdrawals may be possible.  What are withdrawals? Withdrawals: refers to the wide range of symptoms that occur after stopping or dramatically reducing opiate drugs after heavy and prolonged use. Withdrawal symptoms do not occur to patients that use low dose opioids, or those who take the medication sporadically. Contrary to benzodiazepine (example: Valium, Xanax, etc.) or alcohol withdrawals ("Delirium Tremens"), opioid withdrawals are not lethal. Withdrawals are the physical manifestation of the body getting rid of the excess receptors.  Expected Symptoms Early symptoms of withdrawal may include: Agitation Anxiety Muscle aches Increased tearing Insomnia Runny nose Sweating Yawning  Late symptoms of withdrawal may include: Abdominal cramping Diarrhea Dilated pupils Goose bumps Nausea Vomiting  Will I experience withdrawals? Due to the slow nature of the taper, it is very unlikely that you will experience any.  What is a slow taper? Taper: refers to the gradual decrease in dose.  (Last update: 11/09/2019) ____________________________________________________________________________________________    

## 2020-11-15 LAB — TOXASSURE SELECT 13 (MW), URINE

## 2021-01-03 DIAGNOSIS — M180 Bilateral primary osteoarthritis of first carpometacarpal joints: Secondary | ICD-10-CM | POA: Insufficient documentation

## 2021-02-11 ENCOUNTER — Other Ambulatory Visit: Payer: Self-pay

## 2021-02-11 ENCOUNTER — Ambulatory Visit
Admission: RE | Admit: 2021-02-11 | Discharge: 2021-02-11 | Disposition: A | Discharge status: Home / Self Care | Payer: BC Managed Care – PPO | Source: Ambulatory Visit | Attending: Physician Assistant | Admitting: Physician Assistant

## 2021-02-11 ENCOUNTER — Other Ambulatory Visit: Payer: Self-pay | Admitting: Physician Assistant

## 2021-02-11 DIAGNOSIS — M25562 Pain in left knee: Secondary | ICD-10-CM | POA: Insufficient documentation

## 2021-05-07 NOTE — Progress Notes (Signed)
PROVIDER NOTE: Information contained herein reflects review and annotations entered in association with encounter. Interpretation of such information and data should be left to medically-trained personnel. Information provided to patient can be located elsewhere in the medical record under "Patient Instructions". Document created using STT-dictation technology, any transcriptional errors that may result from process are unintentional.    Patient: Samuel Roberts  Service Category: E/M  Provider: Gaspar Cola, MD  DOB: [birth year]  DOS: 05/08/2021  Specialty: Interventional Pain Management  MRN: [id number]  Setting: Ambulatory outpatient  PCP: [name], MD  Type: Established Patient    Referring Provider: [name], MD  Location: Office  Delivery: Face-to-face     HPI  [name], a 66 y.o. year old male, is here today because of his Chronic pain syndrome [G89.4]. Samuel Roberts primary complain today is Knee Pain (Bilateral left is worse.  Right is s/p joint replacment ) and Back Pain (Lumbar right ) Last encounter: My last encounter with him was on 11/13/2020. 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 Pain Assessment: Severity of Chronic pain is reported as a 6 /10. Location: Knee (hip) Left, Right/denies. Onset: More than a month ago. Quality: Discomfort, Constant, Sharp, Dull (knee is sharp on the left). Timing: Constant. Modifying factor(s): medications, ice, heating pad, rest. Vitals:  height is 5' 10"  (1.778 m) and weight is 200 lb (90.7 kg). His temporal temperature is 98.2 F (36.8 C). His blood pressure is 128/90 and his pulse is 83. His respiration is 16 and oxygen saturation is 98%.   Reason for encounter: medication management.   The patient indicates doing well with the current medication regimen. No adverse reactions or side effects reported to the medications.   RTCB: 11/08/2021  Pharmacotherapy Assessment  Analgesic: Tramadol 50 mg, 1 tab PO BID (100 mg/day of tramadol) MME/day: 10 mg/day.   Monitoring: [place] PMP: PDMP reviewed during this encounter.       Pharmacotherapy: No side-effects or adverse reactions reported. Compliance: No problems identified. Effectiveness: Clinically acceptable.  Janett Billow, RN  05/08/2021  1:52 PM  Sign when Signing Visit Nursing Pain Medication Assessment:  Safety precautions to be maintained throughout the outpatient stay will include: orient to surroundings, keep bed in low position, maintain call bell within reach at all times, provide assistance with transfer out of bed and ambulation.  Medication Inspection Compliance: Samuel Roberts did not comply with our request to bring his pills to be counted. He was reminded that bringing the medication bottles, even when empty, is a requirement.  Medication: None brought in. Pill/Patch Count: None available to be  counted. Bottle Appearance: No container available. Did  not bring bottle(s) to appointment. Filled Date: N/A Last Medication intake:  Today    UDS:  Summary  Date Value Ref Range Status  11/13/2020 Note  Final    Comment:    ==================================================================== [employer] Select 13 (MW) ==================================================================== Test                             Result       Flag       Units  Drug Present and Declared for Prescription Verification   Tramadol                       >2212        EXPECTED   ng/mg creat   O-Desmethyltramadol            >2212        EXPECTED   ng/mg creat   N-Desmethyltramadol            1663         EXPECTED   ng/mg creat    Source of tramadol is a prescription medication. O-desmethyltramadol    and N-desmethyltramadol are expected metabolites of tramadol.  ==================================================================== Test                      Result    Flag   Units      Ref Range   Creatinine              226              mg/dL      >=20 ==================================================================== Declared Medications:  The flagging and interpretation on this report are based on the  following declared medications.  Unexpected results may arise from  inaccuracies in the declared medications.   **Note: The testing scope of this panel includes these medications:   Tramadol (Ultram)   **Note: The testing scope of this panel does not include the  following reported medications:   Finasteride (Propecia)  Mesalamine  Pantoprazole (Protonix) ==================================================================== For clinical consultation, please call [phone]. ====================================================================      ROS  Constitutional: Denies any fever or chills Gastrointestinal: No reported hemesis, hematochezia, vomiting, or acute GI  distress Musculoskeletal: Denies any acute onset joint swelling, redness, loss of ROM, or weakness Neurological: No reported episodes of acute onset apraxia, aphasia, dysarthria, agnosia, amnesia, paralysis, loss of coordination, or loss of consciousness  Medication Review  finasteride, mesalamine, pantoprazole, and traMADol  History Review  Allergy: Mr. Salomon has No Known Allergies. Drug: Mr. Dieppa  reports no history of drug use. Alcohol:  reports no history of alcohol use. Tobacco:  reports that he has never smoked. He has never used smokeless tobacco. Social: Mr. Gancarz  reports that he has never smoked. He has never used smokeless tobacco. He reports that he does not drink alcohol and does not use drugs. 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 Surgical: Mr. Stickels  has a past surgical history that includes Knee arthroscopy and arthrotomy; Tonsillectomy; LEFT HEART CATH AND CORONARY ANGIOGRAPHY (Left, 09/09/2016); Joint replacement (Right, 02/17/2018); and LEFT HEART CATH AND CORONARY ANGIOGRAPHY (Left, 07/25/2020). Family: family history includes Cancer in his father; Cirrhosis in his brother; Diabetes in his mother, sister, and sister; Drug abuse in his brother and brother; Heart disease in his brother and brother; Heart failure in his  father and mother.  Laboratory Chemistry Profile   Renal Lab Results  Component Value Date   BUN 16 07/02/2020   CREATININE 0.74 07/02/2020   GFRAA >60 11/25/2019   GFRNONAA >60 07/02/2020    Hepatic Lab Results  Component Value Date   AST 29 07/02/2020   ALT 20 07/02/2020   ALBUMIN 3.9 07/02/2020   ALKPHOS 43 07/02/2020   LIPASE 27 07/02/2020    Electrolytes Lab Results  Component Value Date   NA 135 07/02/2020   K 3.5 07/02/2020   CL 105 07/02/2020   CALCIUM 8.8 (L) 07/02/2020   MG 2.4 (H) 06/24/2017    Bone Lab  Results  Component Value Date   25OHVITD1 37 06/24/2017   25OHVITD2 2.2 06/24/2017   25OHVITD3 35 06/24/2017    Inflammation (CRP: Acute Phase) (ESR: Chronic Phase) Lab Results  Component Value Date   CRP 2.8 06/24/2017   ESRSEDRATE 11 06/24/2017         Note: Above Lab results reviewed.  Recent Imaging Review  US Venous Img Lower Unilateral Left (DVT) CLINICAL DATA:  Left knee pain.  EXAM: LEFT LOWER EXTREMITY VENOUS DOPPLER ULTRASOUND  TECHNIQUE: Gray-scale sonography with graded compression, as well as color Doppler and duplex ultrasound were performed to evaluate the lower extremity deep venous systems from the level of the common femoral vein and including the common femoral, femoral, profunda femoral, popliteal and calf veins including the posterior tibial, peroneal and gastrocnemius veins when visible. The superficial great saphenous vein was also interrogated. Spectral Doppler was utilized to evaluate flow at rest and with distal augmentation maneuvers in the common femoral, femoral and popliteal veins.  COMPARISON:  None.  FINDINGS: Contralateral Common Femoral Vein: Respiratory phasicity is normal and symmetric with the symptomatic side. No evidence of thrombus. Normal compressibility.  Common Femoral Vein: No evidence of thrombus. Normal compressibility, respiratory phasicity and response to augmentation.  Saphenofemoral Junction: No evidence of thrombus. Normal compressibility and flow on color Doppler imaging.  Profunda Femoral Vein: No evidence of thrombus. Normal compressibility and flow on color Doppler imaging.  Femoral Vein: No evidence of thrombus. Normal compressibility, respiratory phasicity and response to augmentation.  Popliteal Vein: No evidence of thrombus. Normal compressibility, respiratory phasicity and response to augmentation.  Calf Veins: No evidence of thrombus. Normal compressibility and flow on color Doppler  imaging.  Superficial Great Saphenous Vein: No evidence of thrombus. Normal compressibility.  Venous Reflux:  None.  Other Findings: No evidence of superficial thrombophlebitis or abnormal fluid collection.  IMPRESSION: No evidence of left lower extremity deep venous thrombosis.  Electronically Signed   By: Aletta Edouard M.D.   On: 02/11/2021 15:26 Note: Reviewed        Physical Exam  General appearance: Well nourished, well developed, and well hydrated. In no apparent acute distress Mental status: Alert, oriented x 3 (person, place, & time)       Respiratory: No evidence of acute respiratory distress Eyes: PERLA Vitals: BP 128/90 (BP Location: Right Arm, Patient Position: Sitting, Cuff Size: Normal)    Pulse 83    Temp 98.2 F (36.8 C) (Temporal)    Resp 16    Ht 5' 10"  (1.778 m)    Wt 200 lb (90.7 kg)    SpO2 98%    BMI 28.70 kg/m  BMI: Estimated body mass index is 28.7 kg/m as calculated from the following:   Height as of this encounter: 5' 10"  (1.778 m).   Weight as of this encounter: 200  lb (90.7 kg). Ideal: Ideal body weight: 73 kg (160 lb 15 oz) Adjusted ideal body weight: 80.1 kg (176 lb 9 oz)  Assessment   Status Diagnosis  Controlled Controlled Controlled 1. Chronic pain syndrome   2. Chronic neck pain (1ry area of Pain) (Bilateral) (L>R)   3. Chronic shoulder pain (2ry area of Pain) (Left)   4. Occipital neuralgia (3ry area of Pain) (Left)   5. Chronic low back pain (4th area of Pain) (Right) without sciatica   6. Lumbar facet joint syndrome (Bilateral)   7. Pharmacologic therapy   8. Chronic use of opiate for therapeutic purpose   9. Encounter for medication management   10. Encounter for chronic pain management      Updated Problems: Problem  Primary Osteoarthritis of First Carpometacarpal Joints, Bilateral  Gastroesophageal Reflux Disease Without Esophagitis  Anxiety  Panic Attack  Hyperlipidemia, Mixed    Plan of Care  Problem-specific:   No problem-specific Assessment & Plan notes found for this encounter.  Mr. SHAUNE WESTFALL has a current medication list which includes the following long-term medication(s): pantoprazole and [START ON 05/12/2021] tramadol.  Pharmacotherapy (Medications Ordered): Meds ordered this encounter  Medications   traMADol (ULTRAM) 50 MG tablet    Sig: Take 1 tablet (50 mg total) by mouth 2 (two) times daily. Each refill must last 30 days.    Dispense:  60 tablet    Refill:  5    DO NOT: delete (not duplicate); no partial-fill (will deny script to complete), no refill request (F/U required). DISPENSE: 1 day early if closed on fill date. WARN: No CNS-depressants within 8 hrs of med.   Orders:  No orders of the defined types were placed in this encounter.  Follow-up plan:   Return in about 6 months (around 11/08/2021) for Eval-day (M,W), (F2F), (MM).     Interventional management options:  Considering:   Diagnostic left-sided cervical facet nerve block  Possible left-sided cervical facet RFA  Diagnostic left suprascapular nerve block  Diagnostic trigger point injection  Diagnostic right lumbar facet nerve block  Diagnostic right lumbar facet RFA  Diagnostic Left knee Hyalgan series  Diagnostic Right knee genicular nerve block  Possible right knee genicular RFA    Palliative PRN treatment(s):   Diagnostic left-sided CESI #3 (PRN)     Recent Visits No visits were found meeting these conditions. Showing recent visits within past 90 days and meeting all other requirements Today's Visits Date Type Provider Dept  05/08/21 Office Visit Milinda Pointer, MD [facility]  Showing today's visits and meeting all other requirements Future Appointments No visits were found meeting these conditions. Showing future appointments within next 90 days and meeting all other requirements  I discussed the assessment and treatment plan with the patient. The patient was provided an opportunity  to ask questions and all were answered. The patient agreed with the plan and demonstrated an understanding of the instructions.  Patient advised to call back or seek an in-person evaluation if the symptoms or condition worsens.  Duration of encounter: 30 minutes.  Note by: Gaspar Cola, MD Date: 05/08/2021; Time: 2:41 PM

## 2021-05-08 ENCOUNTER — Ambulatory Visit: Payer: BC Managed Care – PPO | Attending: Pain Medicine | Admitting: Pain Medicine

## 2021-05-08 ENCOUNTER — Encounter: Payer: Self-pay | Admitting: Pain Medicine

## 2021-05-08 ENCOUNTER — Other Ambulatory Visit: Payer: Self-pay

## 2021-05-08 VITALS — BP 128/90 | HR 83 | Temp 98.2°F | Resp 16 | Ht 70.0 in | Wt 200.0 lb

## 2021-05-08 DIAGNOSIS — G894 Chronic pain syndrome: Secondary | ICD-10-CM | POA: Diagnosis present

## 2021-05-08 DIAGNOSIS — M542 Cervicalgia: Secondary | ICD-10-CM | POA: Insufficient documentation

## 2021-05-08 DIAGNOSIS — Z79891 Long term (current) use of opiate analgesic: Secondary | ICD-10-CM | POA: Diagnosis present

## 2021-05-08 DIAGNOSIS — M25512 Pain in left shoulder: Secondary | ICD-10-CM | POA: Diagnosis present

## 2021-05-08 DIAGNOSIS — M5481 Occipital neuralgia: Secondary | ICD-10-CM | POA: Insufficient documentation

## 2021-05-08 DIAGNOSIS — M545 Low back pain, unspecified: Secondary | ICD-10-CM | POA: Diagnosis present

## 2021-05-08 DIAGNOSIS — G8929 Other chronic pain: Secondary | ICD-10-CM | POA: Diagnosis present

## 2021-05-08 DIAGNOSIS — Z79899 Other long term (current) drug therapy: Secondary | ICD-10-CM | POA: Insufficient documentation

## 2021-05-08 DIAGNOSIS — M47816 Spondylosis without myelopathy or radiculopathy, lumbar region: Secondary | ICD-10-CM | POA: Insufficient documentation

## 2021-05-08 MED ORDER — TRAMADOL HCL 50 MG PO TABS: 50.0000 mg | ORAL_TABLET | Freq: Two times a day (BID) | ORAL | 5 refills | Status: DC

## 2021-05-08 NOTE — Patient Instructions (Signed)
____________________________________________________________________________________________  Medication Rules  Purpose: To inform patients, and their family members, of our rules and regulations.  Applies to: All patients receiving prescriptions (written or electronic).  Pharmacy of record: Pharmacy where electronic prescriptions will be sent. If written prescriptions are taken to a different pharmacy, please inform the nursing staff. The pharmacy listed in the electronic medical record should be the one where you would like electronic prescriptions to be sent.  Electronic prescriptions: In compliance with the [place] (STOP) Act of 2017 (Session Lanny Cramp [phone]), effective April 21, 2018, all controlled substances must be electronically prescribed. Calling prescriptions to the pharmacy will cease to exist.  Prescription refills: Only during scheduled appointments. Applies to all prescriptions.  NOTE: The following applies primarily to controlled substances (Opioid* Pain Medications).   Type of encounter (visit): For patients receiving controlled substances, face-to-face visits are required. (Not an option or up to the patient.)  Patient's responsibilities: Pain Pills: Bring all pain pills to every appointment (except for procedure appointments). Pill Bottles: Bring pills in original pharmacy bottle. Always bring the newest bottle. Bring bottle, even if empty. Medication refills: You are responsible for knowing and keeping track of what medications you take and those you need refilled. The day before your appointment: write a list of all prescriptions that need to be refilled. The day of the appointment: give the list to the admitting nurse. Prescriptions will be written only during appointments. No prescriptions will be written on procedure days. If you forget a medication: it will not be "Called in", "Faxed", or "electronically sent". You will  need to get another appointment to get these prescribed. No early refills. Do not call asking to have your prescription filled early. Prescription Accuracy: You are responsible for carefully inspecting your prescriptions before leaving our office. Have the discharge nurse carefully go over each prescription with you, before taking them home. Make sure that your name is accurately spelled, that your address is correct. Check the name and dose of your medication to make sure it is accurate. Check the number of pills, and the written instructions to make sure they are clear and accurate. Make sure that you are given enough medication to last until your next medication refill appointment. Taking Medication: Take medication as prescribed. When it comes to controlled substances, taking less pills or less frequently than prescribed is permitted and encouraged. Never take more pills than instructed. Never take medication more frequently than prescribed.  Inform other Doctors: Always inform, all of your healthcare providers, of all the medications you take. Pain Medication from other Providers: You are not allowed to accept any additional pain medication from any other Doctor or Healthcare provider. There are two exceptions to this rule. (see below) In the event that you require additional pain medication, you are responsible for notifying us, as stated below. Cough Medicine: Often these contain an opioid, such as codeine or hydrocodone. Never accept or take cough medicine containing these opioids if you are already taking an opioid* medication. The combination may cause respiratory failure and death. Medication Agreement: You are responsible for carefully reading and following our Medication Agreement. This must be signed before receiving any prescriptions from our practice. Safely store a copy of your signed Agreement. Violations to the Agreement will result in no further prescriptions. (Additional copies of our  Medication Agreement are available upon request.) Laws, Rules, & Regulations: All patients are expected to follow all Federal and [employer], [employer], Rules, [employer] [facility]. Ignorance of  the Laws does not constitute a valid excuse.  Illegal drugs and Controlled Substances: The use of illegal substances (including, but not limited to marijuana and its derivatives) and/or the illegal use of any controlled substances is strictly prohibited. Violation of this rule may result in the immediate and permanent discontinuation of any and all prescriptions being written by our practice. The use of any illegal substances is prohibited. Adopted CDC guidelines & recommendations: Target dosing levels will be at or below 60 MME/day. Use of benzodiazepines** is not recommended.  Exceptions: There are only two exceptions to the rule of not receiving pain medications from other Healthcare Providers. Exception #1 (Emergencies): In the event of an emergency (i.e.: accident requiring emergency care), you are allowed to receive additional pain medication. However, you are responsible for: As soon as you are able, call our office [phone], at any time of the day or night, and leave a message stating your name, the date and nature of the emergency, and the name and dose of the medication prescribed. In the event that your call is answered by a member of our staff, make sure to document and save the date, time, and the name of the person that took your information.  Exception #2 (Planned Surgery): In the event that you are scheduled by another doctor or dentist to have any type of surgery or procedure, you are allowed (for a period no longer than 30 days), to receive additional pain medication, for the acute post-op pain. However, in this case, you are responsible for picking up a copy of our "Post-op Pain Management for Surgeons" handout, and giving it to your surgeon or dentist. This document is available at our office, and  does not require an appointment to obtain it. Simply go to our office during business hours (Monday-Thursday from 8:00 AM to 4:00 PM) (Friday 8:00 AM to 12:00 Noon) or if you have a scheduled appointment with [place], prior to your surgery, and ask for it by name. In addition, you are responsible for: calling our office [phone], at any time of the day or night, and leaving a message stating your name, name of your surgeon, type of surgery, and date of procedure or surgery. Failure to comply with your responsibilities may result in termination of therapy involving the controlled substances. Medication Agreement Violation. Following the above rules, including your responsibilities will help you in avoiding a Medication Agreement Violation (Breaking your Pain Medication Contract).  *Opioid medications include: morphine, codeine, oxycodone, oxymorphone, hydrocodone, hydromorphone, meperidine, tramadol, tapentadol, buprenorphine, fentanyl, methadone. **Benzodiazepine medications include: diazepam (Valium), alprazolam (Xanax), clonazepam (Klonopine), lorazepam (Ativan), clorazepate (Tranxene), chlordiazepoxide (Librium), estazolam (Prosom), oxazepam (Serax), temazepam (Restoril), triazolam (Halcion) (Last updated: 01/16/2021) ____________________________________________________________________________________________  ____________________________________________________________________________________________  Medication Recommendations and Reminders  Applies to: All patients receiving prescriptions (written and/or electronic).  Medication Rules & Regulations: These rules and regulations exist for your safety and that of others. They are not flexible and neither are we. Dismissing or ignoring them will be considered "non-compliance" with medication therapy, resulting in complete and irreversible termination of such therapy. (See document titled "Medication Rules" for more details.) In all conscience,  because of safety reasons, we cannot continue providing a therapy where the patient does not follow instructions.  Pharmacy of record:  Definition: This is the pharmacy where your electronic prescriptions will be sent.  We do not endorse any particular pharmacy, however, we have experienced problems with [facility] not securing enough medication supply for the community. We do not restrict you  in your choice of pharmacy. However, once we write for your prescriptions, we will NOT be re-sending more prescriptions to fix restricted supply problems created by your pharmacy, or your insurance.  The pharmacy listed in the electronic medical record should be the one where you want electronic prescriptions to be sent. If you choose to change pharmacy, simply notify our nursing staff.  Recommendations: Keep all of your pain medications in a safe place, under lock and key, even if you live alone. We will NOT replace lost, stolen, or damaged medication. After you fill your prescription, take 1 week's worth of pills and put them away in a safe place. You should keep a separate, properly labeled bottle for this purpose. The remainder should be kept in the original bottle. Use this as your primary supply, until it runs out. Once it's gone, then you know that you have 1 week's worth of medicine, and it is time to come in for a prescription refill. If you do this correctly, it is unlikely that you will ever run out of medicine. To make sure that the above recommendation works, it is very important that you make sure your medication refill appointments are scheduled at least 1 week before you run out of medicine. To do this in an effective manner, make sure that you do not leave the office without scheduling your next medication management appointment. Always ask the nursing staff to show you in your prescription , when your medication will be running out. Then arrange for the receptionist to get you a return appointment,  at least 7 days before you run out of medicine. Do not wait until you have 1 or 2 pills left, to come in. This is very poor planning and does not take into consideration that we may need to cancel appointments due to bad weather, sickness, or emergencies affecting our staff. DO NOT ACCEPT A "Partial Fill": If for any reason your pharmacy does not have enough pills/tablets to completely fill or refill your prescription, do not allow for a "partial fill". The law allows the pharmacy to complete that prescription within 72 hours, without requiring a new prescription. If they do not fill the rest of your prescription within those 72 hours, you will need a separate prescription to fill the remaining amount, which we will NOT provide. If the reason for the partial fill is your insurance, you will need to talk to the pharmacist about payment alternatives for the remaining tablets, but again, DO NOT ACCEPT A PARTIAL FILL, unless you can trust your pharmacist to obtain the remainder of the pills within 72 hours.  Prescription refills and/or changes in medication(s):  Prescription refills, and/or changes in dose or medication, will be conducted only during scheduled medication management appointments. (Applies to both, written and electronic prescriptions.) No refills on procedure days. No medication will be changed or started on procedure days. No changes, adjustments, and/or refills will be conducted on a procedure day. Doing so will interfere with the diagnostic portion of the procedure. No phone refills. No medications will be "called into the pharmacy". No Fax refills. No weekend refills. No Holliday refills. No after hours refills.  Remember:  Business hours are:  Monday to Thursday 8:00 AM to 4:00 PM Provider's Schedule: Milinda Pointer, MD - Appointments are:  Medication management: Monday and Wednesday 8:00 AM to 4:00 PM Procedure day: Tuesday and Thursday 7:30 AM to 4:00 PM Gillis Santa, MD -  Appointments are:  Medication management: Tuesday and Thursday 8:00  AM to 4:00 PM Procedure day: Monday and Wednesday 7:30 AM to 4:00 PM (Last update: 11/09/2019) ____________________________________________________________________________________________  ____________________________________________________________________________________________  CBD (cannabidiol) & Delta-8 (Delta-8 tetrahydrocannabinol) WARNING  Intro: Cannabidiol (CBD) and tetrahydrocannabinol (THC), are two natural compounds found in plants of the Cannabis genus. They can both be extracted from hemp or cannabis. Hemp and cannabis come from the Cannabis sativa plant. Both compounds interact with your bodys endocannabinoid system, but they have very different effects. CBD does not produce the high sensation associated with cannabis. Delta-8 tetrahydrocannabinol, also known as delta-8 THC, is a psychoactive substance found in the Cannabis sativa plant, of which marijuana and hemp are two varieties. THC is responsible for the high associated with the illicit use of marijuana.  Applicable to: All individuals currently taking or considering taking CBD (cannabidiol) and, more important, all patients taking opioid analgesic controlled substances (pain medication). (Example: oxycodone; oxymorphone; hydrocodone; hydromorphone; morphine; methadone; tramadol; tapentadol; fentanyl; buprenorphine; butorphanol; dextromethorphan; meperidine; codeine; etc.)  Legal status: CBD remains a Schedule I drug prohibited for any use. CBD is illegal with one exception. In the [place], CBD has a limited Transport planner (FDA) approval for the treatment of two specific types of epilepsy disorders. Only one CBD product has been approved by the FDA for this purpose: "Epidiolex". FDA is aware that some companies are marketing products containing cannabis and cannabis-derived compounds in ways that violate the [employer], Drug and Cosmetic Act  [facility] Act) and that may put the health and safety of consumers at risk. The FDA, a Federal agency, has not enforced the CBD status since 2018.   Legality: Some manufacturers ship CBD products nationally, which is illegal. Often such products are sold online and are therefore available throughout the country. CBD is openly sold in head shops and health food stores in some states where such sales have not been explicitly legalized. Selling unapproved products with unsubstantiated therapeutic claims is not only a violation of the law, but also can put patients at risk, as these products have not been proven to be safe or effective. Federal illegality makes it difficult to conduct research on CBD.  Reference: "FDA Regulation of Cannabis and Cannabis-Derived Products, Including Cannabidiol (CBD)" - [employer]  Warning: CBD is not FDA approved and has not undergo the same manufacturing controls as prescription drugs.  This means that the purity and safety of available CBD may be questionable. Most of the time, despite manufacturer's claims, it is contaminated with THC (delta-9-tetrahydrocannabinol - the chemical in marijuana responsible for the "HIGH").  When this is the case, the [facility] contaminant will trigger a positive urine drug screen (UDS) test for Marijuana (carboxy-THC). Because a positive UDS for any illicit substance is a violation of our medication agreement, your opioid analgesics (pain medicine) may be permanently discontinued. The FDA recently put out a warning about 5 things that everyone should be aware of regarding Delta-8 THC: Delta-8 THC products have not been evaluated or approved by the FDA for safe use and may be marketed in ways that put the public health at risk. The FDA has received adverse event reports involving delta-8 THC-containing products. Delta-8 THC has  psychoactive and intoxicating effects. Delta-8 THC manufacturing often involve use of potentially harmful chemicals to create the concentrations of delta-8 THC claimed in the marketplace. The final delta-8 THC product may have potentially harmful by-products (contaminants) due to the chemicals used in the process. Manufacturing of delta-8 THC products may occur in uncontrolled or unsanitary settings, which may  lead to the presence of unsafe contaminants or other potentially harmful substances. Delta-8 THC products should be kept out of the reach of children and pets.  MORE ABOUT CBD  General Information: CBD was discovered in [birth year] and it is a derivative of the cannabis sativa genus plants (Marijuana and Hemp). It is one of the 113 identified substances found in Marijuana. It accounts for up to 40% of the plant's extract. As of 2018, preliminary clinical studies on CBD included research for the treatment of anxiety, movement disorders, and pain. CBD is available and consumed in multiple forms, including inhalation of smoke or vapor, as an aerosol spray, and by mouth. It may be supplied as an oil containing CBD, capsules, dried cannabis, or as a liquid solution. CBD is thought not to be as psychoactive as THC (delta-9-tetrahydrocannabinol - the chemical in marijuana responsible for the "HIGH"). Studies suggest that CBD may interact with different biological target receptors in the body, including cannabinoid and other neurotransmitter receptors. As of 2018 the mechanism of action for its biological effects has not been determined.  Side-effects   Adverse reactions: Dry mouth, diarrhea, decreased appetite, fatigue, drowsiness, malaise, weakness, sleep disturbances, and others.  Drug interactions: CBC may interact with other medications such as blood-thinners. (Last update: 01/18/2021) ____________________________________________________________________________________________   ____________________________________________________________________________________________  Drug Holidays (Slow)  What is a "Drug Holiday"? Drug Holiday: is the name given to the period of time during which a patient stops taking a medication(s) for the purpose of eliminating tolerance to the drug.  Benefits Improved effectiveness of opioids. Decreased opioid dose needed to achieve benefits. Improved pain with lesser dose.  What is tolerance? Tolerance: is the progressive decreased in effectiveness of a drug due to its repetitive use. With repetitive use, the body gets use to the medication and as a consequence, it loses its effectiveness. This is a common problem seen with opioid pain medications. As a result, a larger dose of the drug is needed to achieve the same effect that used to be obtained with a smaller dose.  How long should a "Drug Holiday" last? You should stay off of the pain medicine for at least 14 consecutive days. (2 weeks)  Should I stop the medicine "cold [place]"? No. You should always coordinate with your Pain Specialist so that he/she can provide you with the correct medication dose to make the transition as smoothly as possible.  How do I stop the medicine? Slowly. You will be instructed to decrease the daily amount of pills that you take by one (1) pill every seven (7) days. This is called a "slow downward taper" of your dose. For example: if you normally take four (4) pills per day, you will be asked to drop this dose to three (3) pills per day for seven (7) days, then to two (2) pills per day for seven (7) days, then to one (1) per day for seven (7) days, and at the end of those last seven (7) days, this is when the "Drug Holiday" would start.   Will I have withdrawals? By doing a "slow downward taper" like this one, it is unlikely that you will experience any significant withdrawal symptoms. Typically, what triggers withdrawals is the sudden stop of a high dose  opioid therapy. Withdrawals can usually be avoided by slowly decreasing the dose over a prolonged period of time. If you do not follow these instructions and decide to stop your medication abruptly, withdrawals may be possible.  What are withdrawals? Withdrawals: refers  to the wide range of symptoms that occur after stopping or dramatically reducing opiate drugs after heavy and prolonged use. Withdrawal symptoms do not occur to patients that use low dose opioids, or those who take the medication sporadically. Contrary to benzodiazepine (example: Valium, Xanax, etc.) or alcohol withdrawals (Delirium Tremens), opioid withdrawals are not lethal. Withdrawals are the physical manifestation of the body getting rid of the excess receptors.  Expected Symptoms Early symptoms of withdrawal may include: Agitation Anxiety Muscle aches Increased tearing Insomnia Runny nose Sweating Yawning  Late symptoms of withdrawal may include: Abdominal cramping Diarrhea Dilated pupils Goose bumps Nausea Vomiting  Will I experience withdrawals? Due to the slow nature of the taper, it is very unlikely that you will experience any.  What is a slow taper? Taper: refers to the gradual decrease in dose.  (Last update: 11/09/2019) ____________________________________________________________________________________________

## 2021-05-08 NOTE — Progress Notes (Signed)
Nursing Pain Medication Assessment:  Safety precautions to be maintained throughout the outpatient stay will include: orient to surroundings, keep bed in low position, maintain call bell within reach at all times, provide assistance with transfer out of bed and ambulation.  Medication Inspection Compliance: Mr. Mella did not comply with our request to bring his pills to be counted. He was reminded that bringing the medication bottles, even when empty, is a requirement.  Medication: None brought in. Pill/Patch Count: None available to be counted. Bottle Appearance: No container available. Did not bring bottle(s) to appointment. Filled Date: N/A Last Medication intake:  Today

## 2021-06-04 IMAGING — CR DG CHEST 2V
1 series · 2 of 2 positions shown · non-contrast
Comparison: 09/05/2016

CLINICAL DATA: Chest pain

EXAM:
CHEST - 2 VIEW

[Series 1: dg chest 2 view · 0.14mm/px · 2 of 2 slices shown]
[im 1/2]
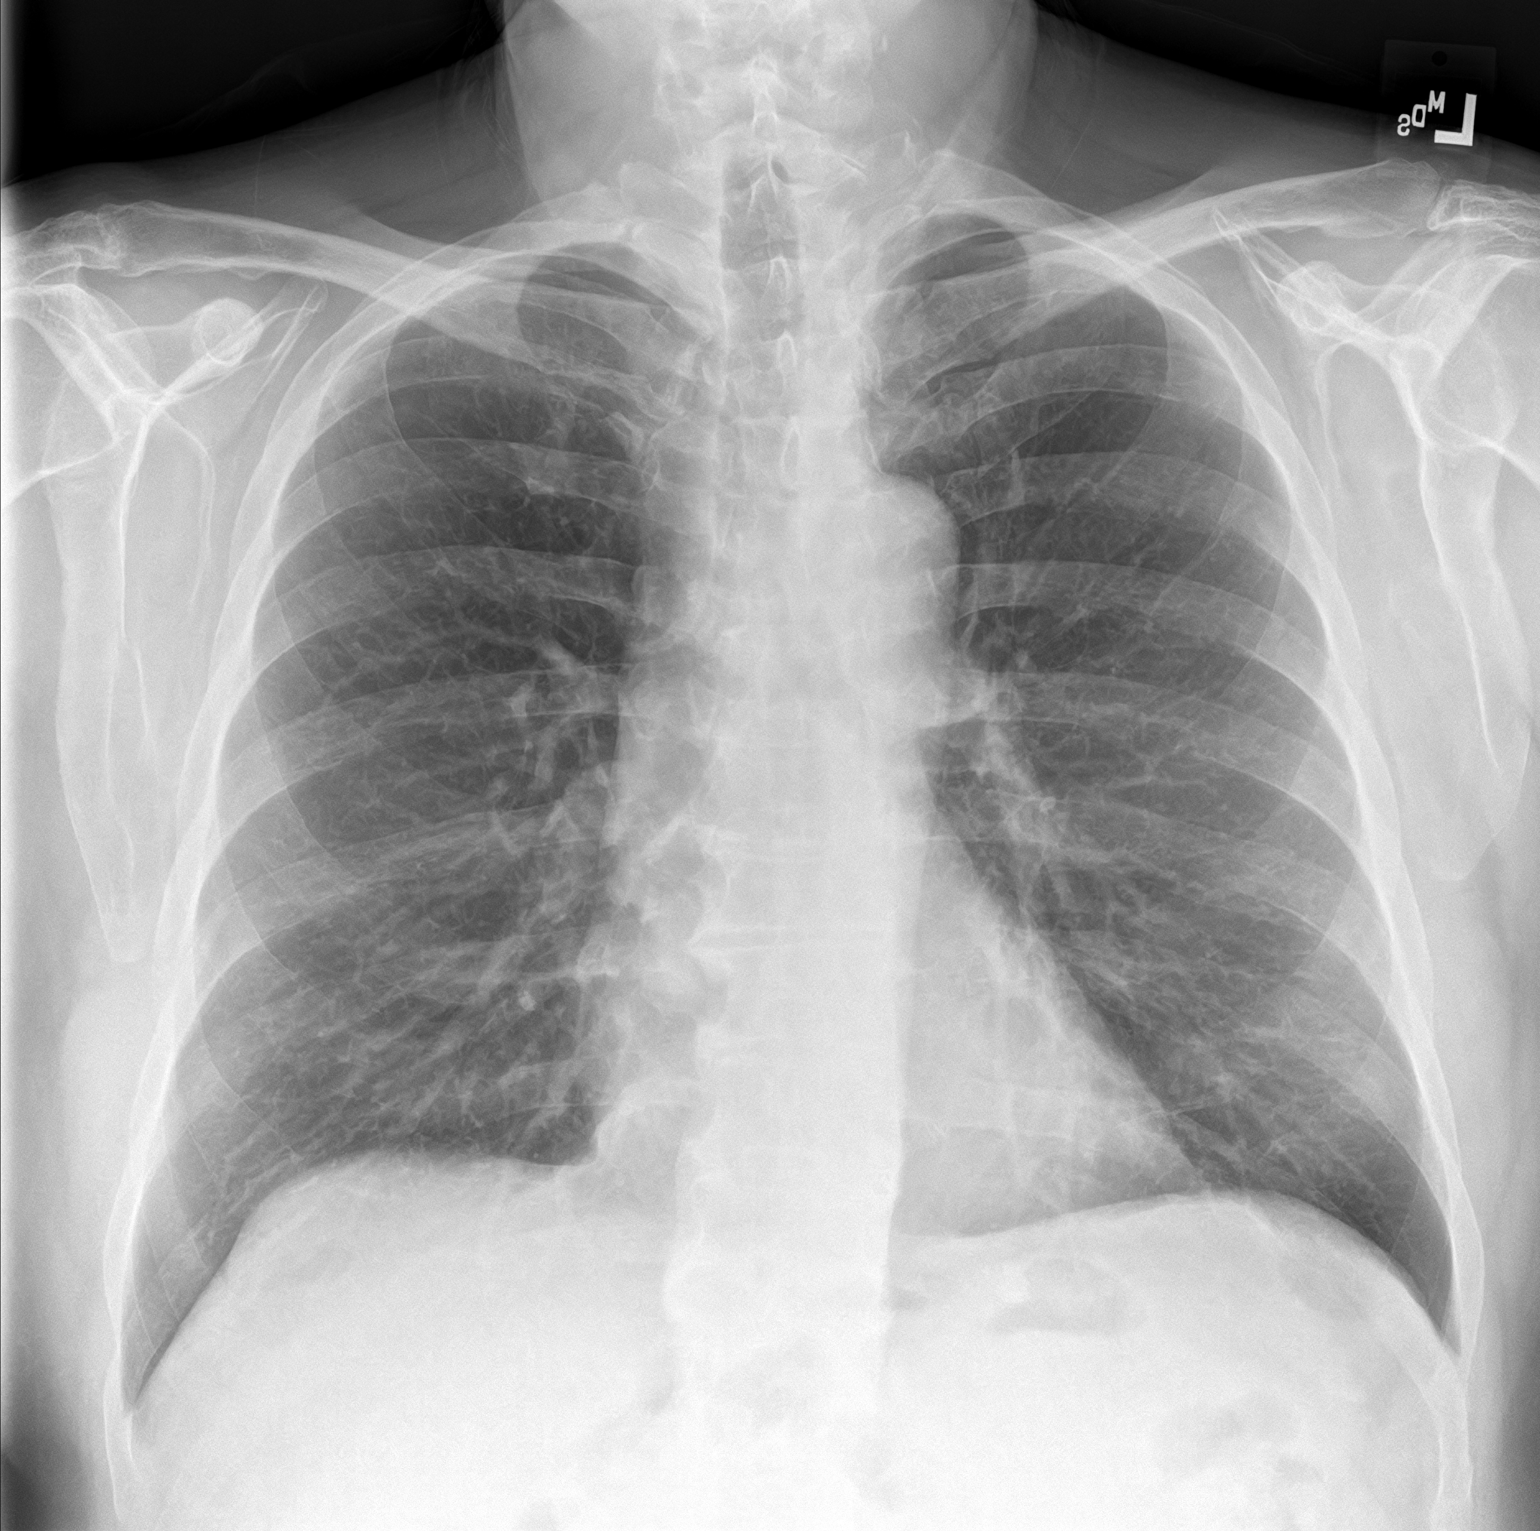
[im 2/2]
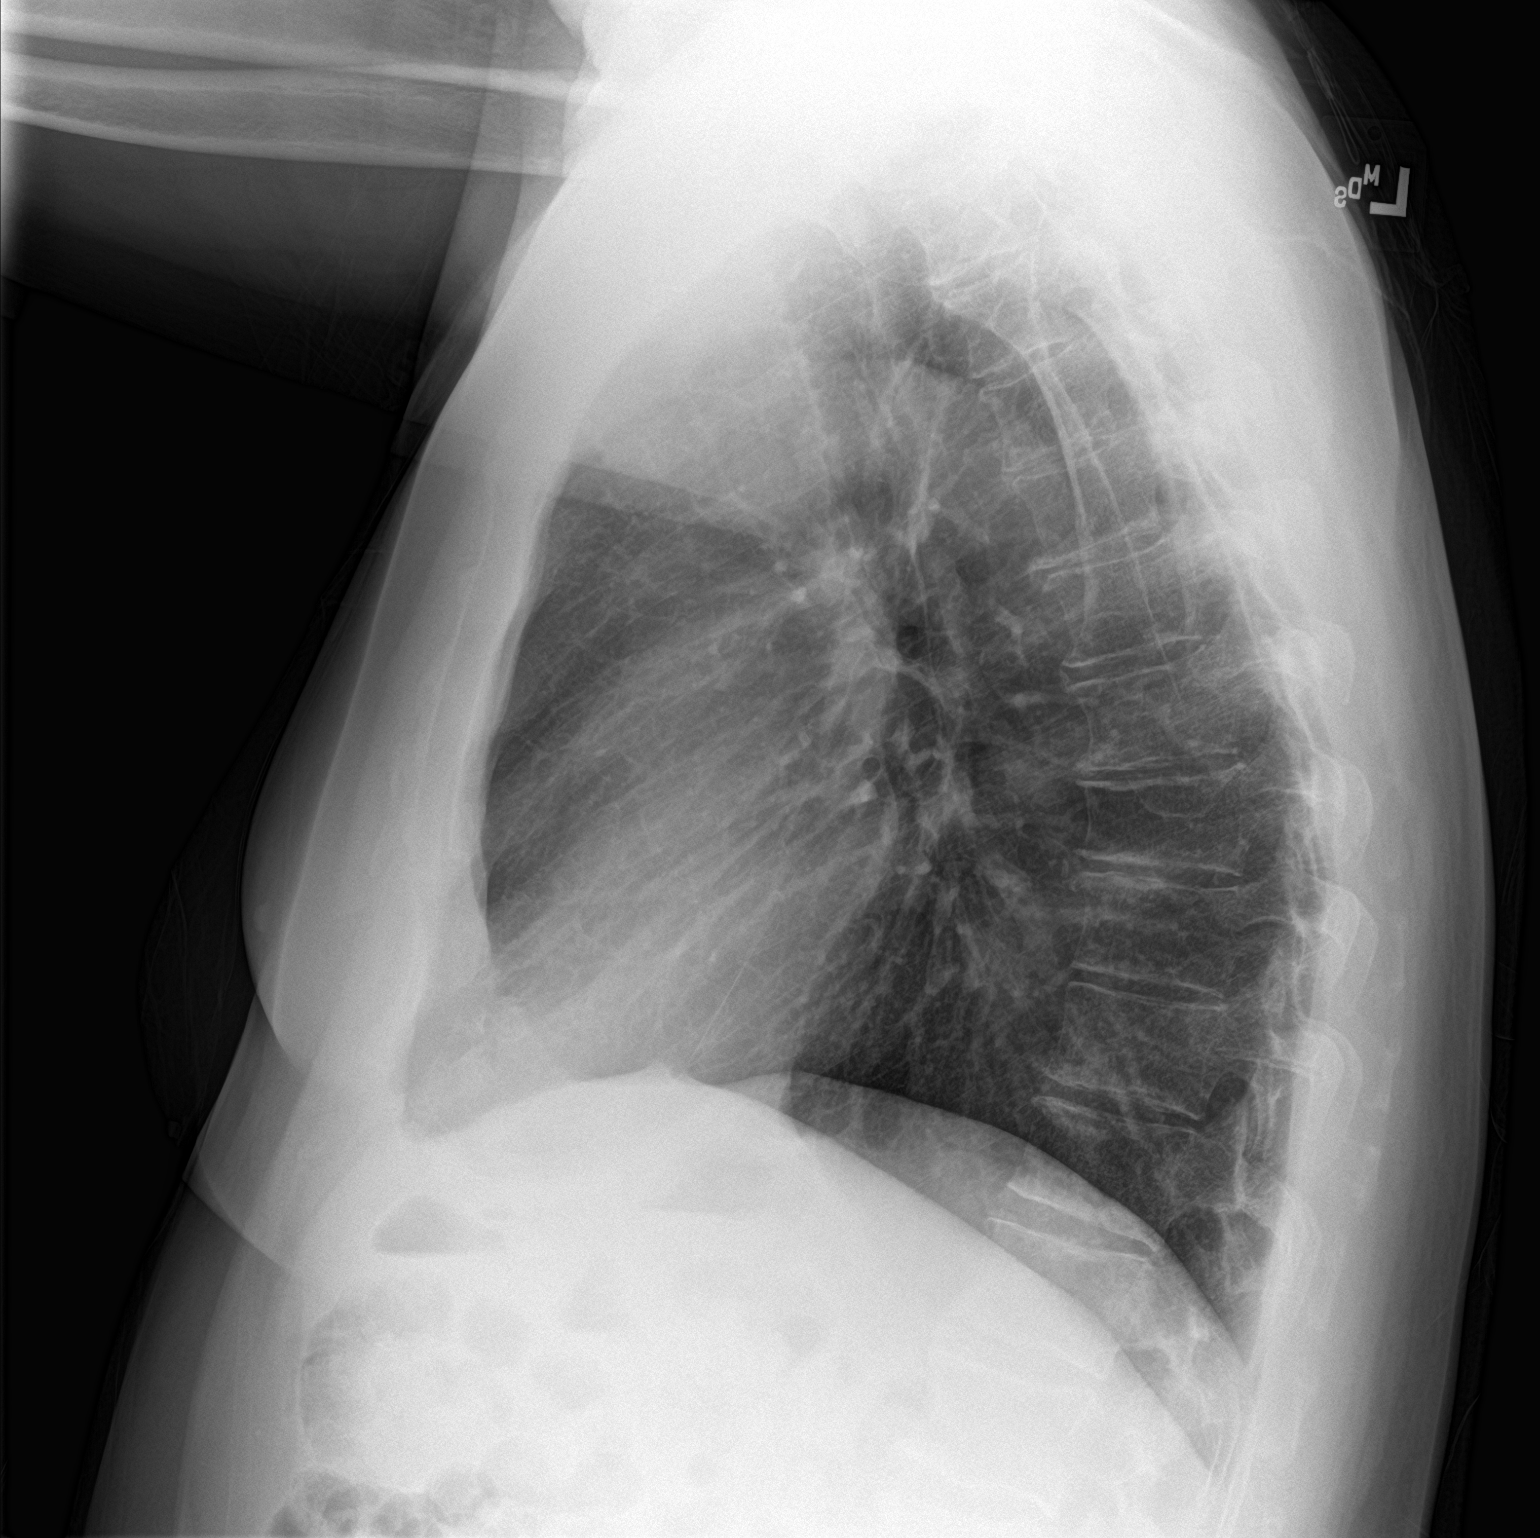

[2 of 2 positions shown; findings below may reference images not displayed]

FINDINGS: Cardiac shadow is within normal limits. The lungs are well aerated
bilaterally. No focal infiltrate is noted. No sizable effusion is
seen. No bony abnormality is noted.
IMPRESSION: No active cardiopulmonary disease.

## 2021-10-27 NOTE — Progress Notes (Unsigned)
PROVIDER NOTE: Information contained herein reflects review and annotations entered in association with encounter. Interpretation of such information and data should be left to medically-trained personnel. Information provided to patient can be located elsewhere in the medical record under "Patient Instructions". Document created using STT-dictation technology, any transcriptional errors that may result from process are unintentional.    Patient: Samuel Roberts  Service Category: E/M  Provider: Gaspar Cola, MD  DOB: [birth year]  DOS: 10/30/2021  Specialty: Interventional Pain Management  MRN: [id number]  Setting: Ambulatory outpatient  PCP: [name], MD  Type: Established Patient    Referring Provider: [name], MD  Location: Office  Delivery: Face-to-face     HPI  [name], a 66 y.o. year old male, is here today because of his No primary diagnosis found.. [name] primary complain today is No chief complaint on file. Last encounter: My last encounter with him was on 05/08/2021. Pertinent problems: 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 Pain Assessment: Severity of   is reported as a  /10. Location:    / . Onset:  . Quality:  . Timing:  . Modifying factor(s):  Marland Kitchen Vitals:  vitals were not taken for this visit.   Reason for encounter:  *** . ***  Pharmacotherapy Assessment  Analgesic: Tramadol 50 mg, 1 tab PO BID (100 mg/day of tramadol) MME/day: 10 mg/day.   Monitoring: [place] PMP: PDMP reviewed during this encounter.       Pharmacotherapy: No side-effects or adverse reactions reported. Compliance: No problems identified. Effectiveness: Clinically acceptable.  No notes on file  UDS:  Summary  Date Value Ref Range Status  11/13/2020 Note  Final    Comment:    ==================================================================== [employer] Select 13 (MW) ==================================================================== Test                             Result       Flag       Units  Drug Present and Declared for Prescription Verification   Tramadol                       >2212        EXPECTED   ng/mg creat   O-Desmethyltramadol            >2212        EXPECTED   ng/mg creat   N-Desmethyltramadol            1663         EXPECTED   ng/mg creat    Source of tramadol is a prescription medication. O-desmethyltramadol  and N-desmethyltramadol are expected metabolites of tramadol.  ==================================================================== Test                      Result    Flag   Units      Ref Range   Creatinine              226              mg/dL      >=20 ==================================================================== Declared Medications:  The flagging and interpretation on this report are based on the  following declared medications.  Unexpected results may arise from  inaccuracies in the  declared medications.   **Note: The testing scope of this panel includes these medications:   Tramadol (Ultram)   **Note: The testing scope of this panel does not include the  following reported medications:   Finasteride (Propecia)  Mesalamine  Pantoprazole (Protonix) ==================================================================== For clinical consultation, please call [phone]. ====================================================================      ROS  Constitutional: Denies any fever or chills Gastrointestinal: No reported hemesis, hematochezia, vomiting, or acute GI distress Musculoskeletal: Denies any acute onset joint swelling, redness, loss of ROM, or weakness Neurological: No reported episodes of acute onset apraxia, aphasia, dysarthria, agnosia, amnesia, paralysis, loss of coordination, or loss of consciousness  Medication Review  finasteride, mesalamine, pantoprazole, and traMADol  History Review  Allergy: Mr. Labrador has No Known Allergies. Drug: Mr. Etchison  reports no history of drug use. Alcohol:  reports no history of alcohol use. Tobacco:  reports that he has never smoked. He has never used smokeless tobacco. Social: Mr. Mantz  reports that he has never smoked. He has never used smokeless tobacco. He reports that he does not drink alcohol and does not use drugs. Medical:  has a past medical history of BPH (benign prostatic hyperplasia), Colitis, ulcerative ([place]), GERD (gastroesophageal reflux disease), and Hypertension. Surgical: Mr. Mian  has a past surgical history that includes Knee arthroscopy and arthrotomy; Tonsillectomy; LEFT HEART CATH AND CORONARY ANGIOGRAPHY (Left, 09/09/2016); Joint replacement (Right, 02/17/2018); and LEFT HEART CATH AND CORONARY ANGIOGRAPHY (Left, 07/25/2020). Family: family history includes Cancer in his father; Cirrhosis in his brother; Diabetes in his mother, sister, and sister; Drug abuse in his brother and brother;  Heart disease in his brother and brother; Heart failure in his father and mother.  Laboratory Chemistry Profile   Renal Lab Results  Component Value Date   BUN 16 07/02/2020   CREATININE 0.74 07/02/2020   GFRAA >60 11/25/2019   GFRNONAA >60 07/02/2020    Hepatic Lab Results  Component Value Date   AST 29 07/02/2020   ALT 20 07/02/2020   ALBUMIN 3.9 07/02/2020   ALKPHOS 43 07/02/2020   LIPASE 27 07/02/2020    Electrolytes Lab Results  Component Value Date   NA 135 07/02/2020   K 3.5 07/02/2020   CL 105 07/02/2020   CALCIUM 8.8 (L) 07/02/2020   MG 2.4 (H) 06/24/2017    Bone Lab Results  Component Value Date   25OHVITD1 37 06/24/2017   25OHVITD2 2.2 06/24/2017   25OHVITD3 35 06/24/2017    Inflammation (CRP: Acute Phase) (ESR: Chronic Phase) Lab Results  Component Value Date   CRP 2.8 06/24/2017   ESRSEDRATE 11 06/24/2017         Note: Above Lab results reviewed.  Recent Imaging Review  US Venous Img Lower Unilateral Left (DVT) CLINICAL DATA:  Left knee pain.  EXAM: LEFT LOWER EXTREMITY VENOUS DOPPLER ULTRASOUND  TECHNIQUE: Gray-scale  sonography with graded compression, as well as color Doppler and duplex ultrasound were performed to evaluate the lower extremity deep venous systems from the level of the common femoral vein and including the common femoral, femoral, profunda femoral, popliteal and calf veins including the posterior tibial, peroneal and gastrocnemius veins when visible. The superficial great saphenous vein was also interrogated. Spectral Doppler was utilized to evaluate flow at rest and with distal augmentation maneuvers in the common femoral, femoral and popliteal veins.  COMPARISON:  None.  FINDINGS: Contralateral Common Femoral Vein: Respiratory phasicity is normal and symmetric with the symptomatic side. No evidence of thrombus. Normal compressibility.  Common Femoral Vein: No evidence of thrombus. Normal compressibility,  respiratory phasicity and response to augmentation.  Saphenofemoral Junction: No evidence of thrombus. Normal compressibility and flow on color Doppler imaging.  Profunda Femoral Vein: No evidence of thrombus. Normal compressibility and flow on color Doppler imaging.  Femoral Vein: No evidence of thrombus. Normal compressibility, respiratory phasicity and response to augmentation.  Popliteal Vein: No evidence of thrombus. Normal compressibility, respiratory phasicity and response to augmentation.  Calf Veins: No evidence of thrombus. Normal compressibility and flow on color Doppler imaging.  Superficial Great Saphenous Vein: No evidence of thrombus. Normal compressibility.  Venous Reflux:  None.  Other Findings: No evidence of superficial thrombophlebitis or abnormal fluid collection.  IMPRESSION: No evidence of left lower extremity deep venous thrombosis.  Electronically Signed   By: Aletta Edouard M.D.   On: 02/11/2021 15:26 Note: Reviewed        Physical Exam  General appearance: Well nourished, well developed, and well hydrated. In no apparent acute distress Mental status: Alert, oriented x 3 (person, place, & time)       Respiratory: No evidence of acute respiratory distress Eyes: PERLA Vitals: There were no vitals taken for this visit. BMI: Estimated body mass index is 28.7 kg/m as calculated from the following:   Height as of 05/08/21: 5' 10"  (1.778 m).   Weight as of 05/08/21: 200 lb (90.7 kg). Ideal: Patient weight not recorded  Assessment   Diagnosis Status  No diagnosis found. Controlled Controlled Controlled   Updated Problems: No problems updated.  Plan of Care  Problem-specific:  No problem-specific Assessment & Plan notes found for this encounter.  Mr. ELIZAH MIERZWA has a current medication list which includes the following long-term medication(s): pantoprazole and tramadol.  Pharmacotherapy (Medications Ordered): No orders of the defined  types were placed in this encounter.  Orders:  No orders of the defined types were placed in this encounter.  Follow-up plan:   No follow-ups on file.     Interventional management options:  Considering:   Diagnostic left-sided cervical facet nerve block  Possible left-sided cervical facet RFA  Diagnostic left suprascapular nerve block  Diagnostic trigger point injection  Diagnostic right lumbar facet nerve block  Diagnostic right lumbar facet RFA  Diagnostic Left knee Hyalgan series  Diagnostic Right knee genicular nerve block  Possible right knee genicular RFA    Palliative PRN treatment(s):   Diagnostic left-sided CESI #3 (PRN)      Recent Visits No visits were found meeting these conditions. Showing recent visits within past 90 days and meeting all other requirements Future Appointments Date Type Provider Dept  10/30/21 Appointment Milinda Pointer, MD [facility]  Showing future appointments within next 90 days and meeting all other requirements  I discussed the assessment and treatment plan with the patient. The patient was provided an opportunity to ask  questions and all were answered. The patient agreed with the plan and demonstrated an understanding of the instructions.  Patient advised to call back or seek an in-person evaluation if the symptoms or condition worsens.  Duration of encounter: *** minutes.  Total time on encounter, as per AMA guidelines included both the face-to-face and non-face-to-face time personally spent by the physician and/or other qualified health care professional(s) on the day of the encounter (includes time in activities that require the physician or other qualified health care professional and does not include time in activities normally performed by clinical staff). Physician's time may include the following activities when performed: preparing to see the patient (eg, review of tests, pre-charting review of records) obtaining  and/or reviewing separately obtained history performing a medically appropriate examination and/or evaluation counseling and educating the patient/family/caregiver ordering medications, tests, or procedures referring and communicating with other health care professionals (when not separately reported) documenting clinical information in the electronic or other health record independently interpreting results (not separately reported) and communicating results to the patient/ family/caregiver care coordination (not separately reported)  Note by: Gaspar Cola, MD Date: 10/30/2021; Time: 3:58 PM

## 2021-10-30 ENCOUNTER — Encounter: Payer: Self-pay | Admitting: Pain Medicine

## 2021-10-30 ENCOUNTER — Ambulatory Visit: Payer: BC Managed Care – PPO | Attending: Pain Medicine | Admitting: Pain Medicine

## 2021-10-30 VITALS — BP 130/89 | HR 81 | Temp 97.2°F | Ht 70.0 in | Wt 200.0 lb

## 2021-10-30 DIAGNOSIS — M25512 Pain in left shoulder: Secondary | ICD-10-CM | POA: Insufficient documentation

## 2021-10-30 DIAGNOSIS — G894 Chronic pain syndrome: Secondary | ICD-10-CM | POA: Diagnosis not present

## 2021-10-30 DIAGNOSIS — Z79899 Other long term (current) drug therapy: Secondary | ICD-10-CM | POA: Insufficient documentation

## 2021-10-30 DIAGNOSIS — G8929 Other chronic pain: Secondary | ICD-10-CM | POA: Insufficient documentation

## 2021-10-30 DIAGNOSIS — Z79891 Long term (current) use of opiate analgesic: Secondary | ICD-10-CM | POA: Insufficient documentation

## 2021-10-30 DIAGNOSIS — M542 Cervicalgia: Secondary | ICD-10-CM | POA: Diagnosis not present

## 2021-10-30 DIAGNOSIS — M5481 Occipital neuralgia: Secondary | ICD-10-CM | POA: Diagnosis not present

## 2021-10-30 DIAGNOSIS — M47816 Spondylosis without myelopathy or radiculopathy, lumbar region: Secondary | ICD-10-CM | POA: Insufficient documentation

## 2021-10-30 DIAGNOSIS — M545 Low back pain, unspecified: Secondary | ICD-10-CM | POA: Diagnosis present

## 2021-10-30 MED ORDER — TRAMADOL HCL 50 MG PO TABS: 50.0000 mg | ORAL_TABLET | Freq: Two times a day (BID) | ORAL | 5 refills | Status: DC

## 2021-10-30 NOTE — Progress Notes (Signed)
Nursing Pain Medication Assessment:  Safety precautions to be maintained throughout the outpatient stay will include: orient to surroundings, keep bed in low position, maintain call bell within reach at all times, provide assistance with transfer out of bed and ambulation.  Medication Inspection Compliance: Pill count conducted under aseptic conditions, in front of the patient. Neither the pills nor the bottle was removed from the patient's sight at any time. Once count was completed pills were immediately returned to the patient in their original bottle.  Medication: Tramadol (Ultram) Pill/Patch Count:  22 of 60 pills remain Pill/Patch Appearance: Markings consistent with prescribed medication Bottle Appearance: Standard pharmacy container. Clearly labeled. Filled Date: 6 / 24 / 2023 Last Medication intake:  TodaySafety precautions to be maintained throughout the outpatient stay will include: orient to surroundings, keep bed in low position, maintain call bell within reach at all times, provide assistance with transfer out of bed and ambulation.

## 2021-10-30 NOTE — Patient Instructions (Signed)

## 2021-11-05 LAB — TOXASSURE SELECT 13 (MW), URINE

## 2022-01-10 IMAGING — CR DG CHEST 2V
1 series · 2 of 2 positions shown · non-contrast
Comparison: 11/25/2019

CLINICAL DATA: Chest pain

EXAM:
CHEST - 2 VIEW

[Series 1: w chest pa · 0.14mm/px · 2 of 2 slices shown]
[im 1/2]
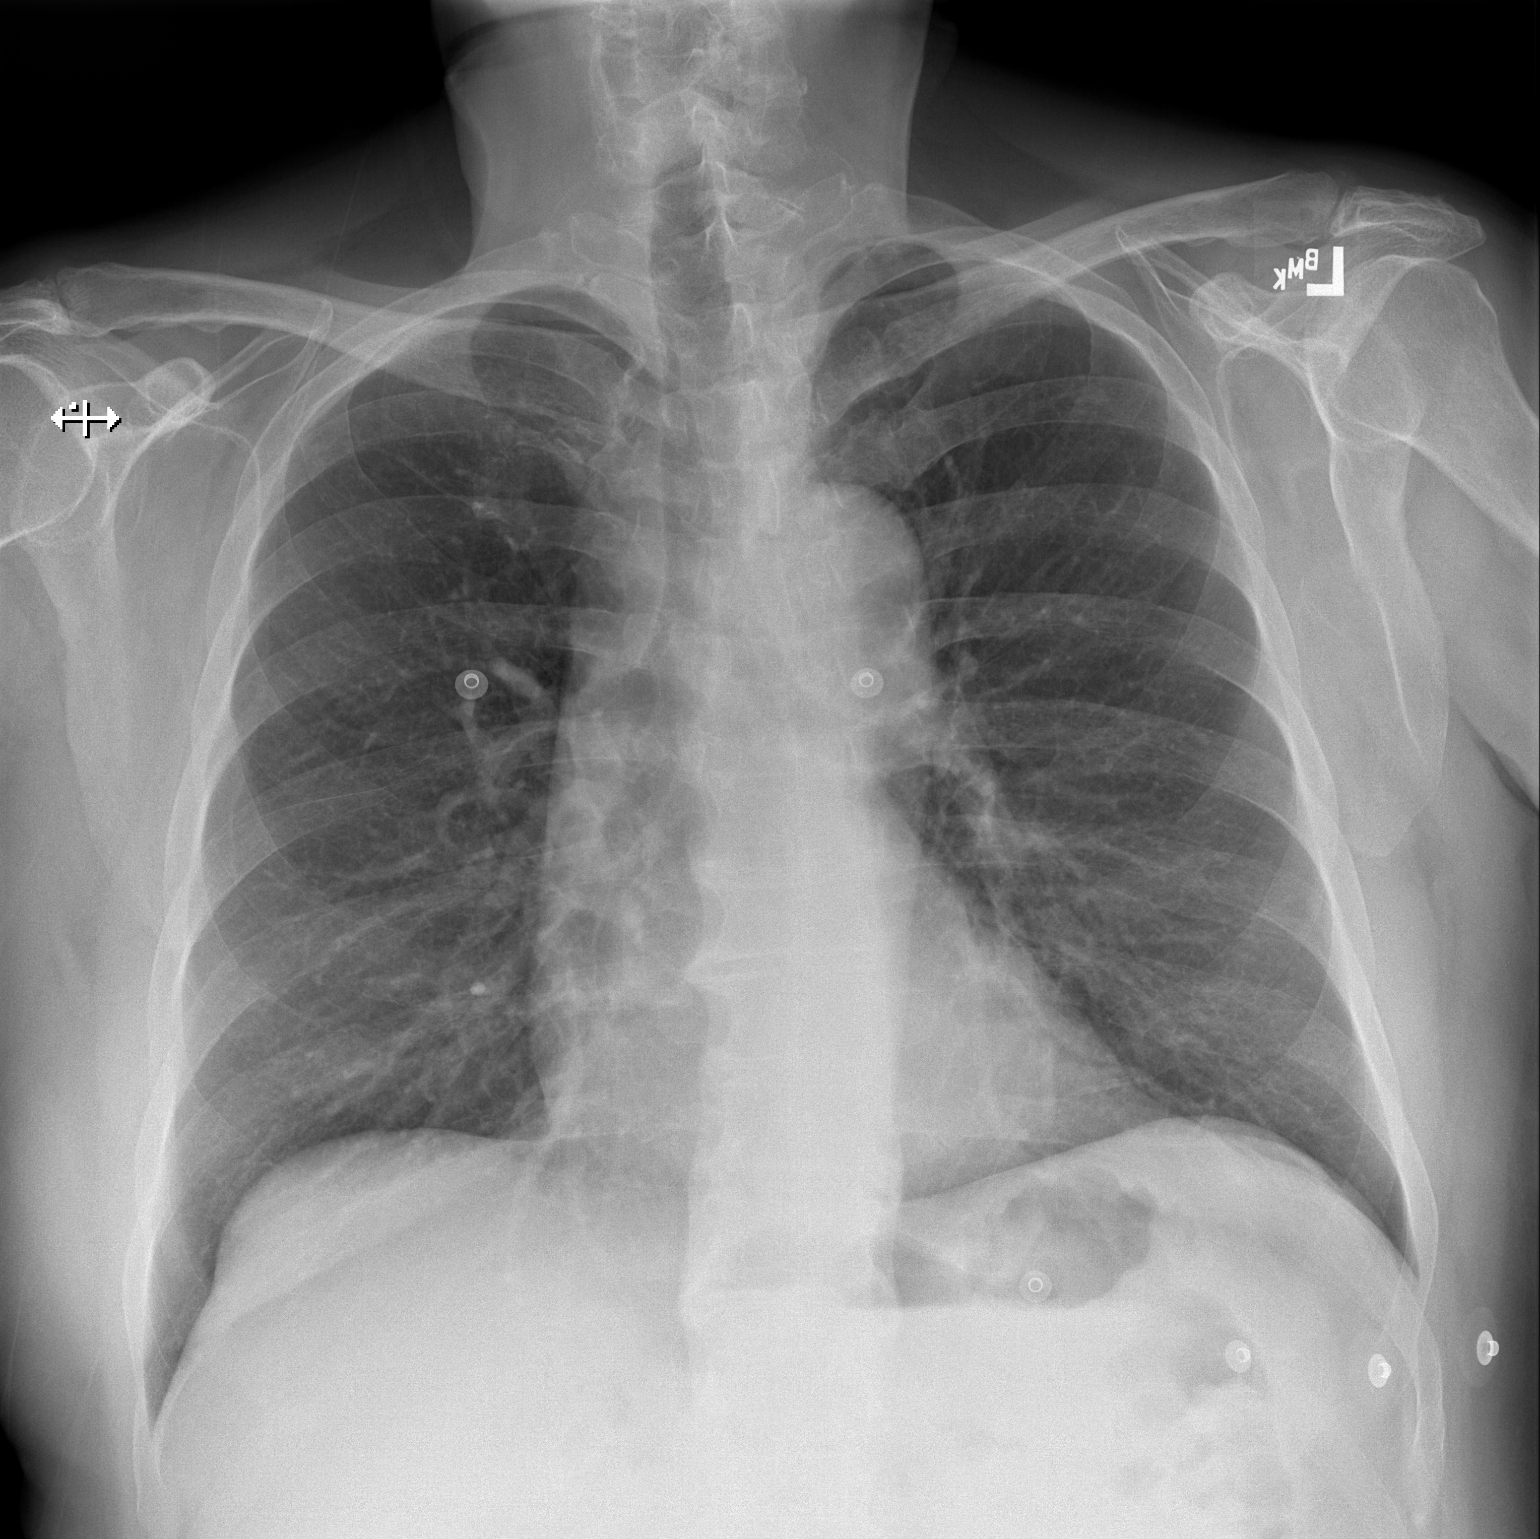
[im 2/2]
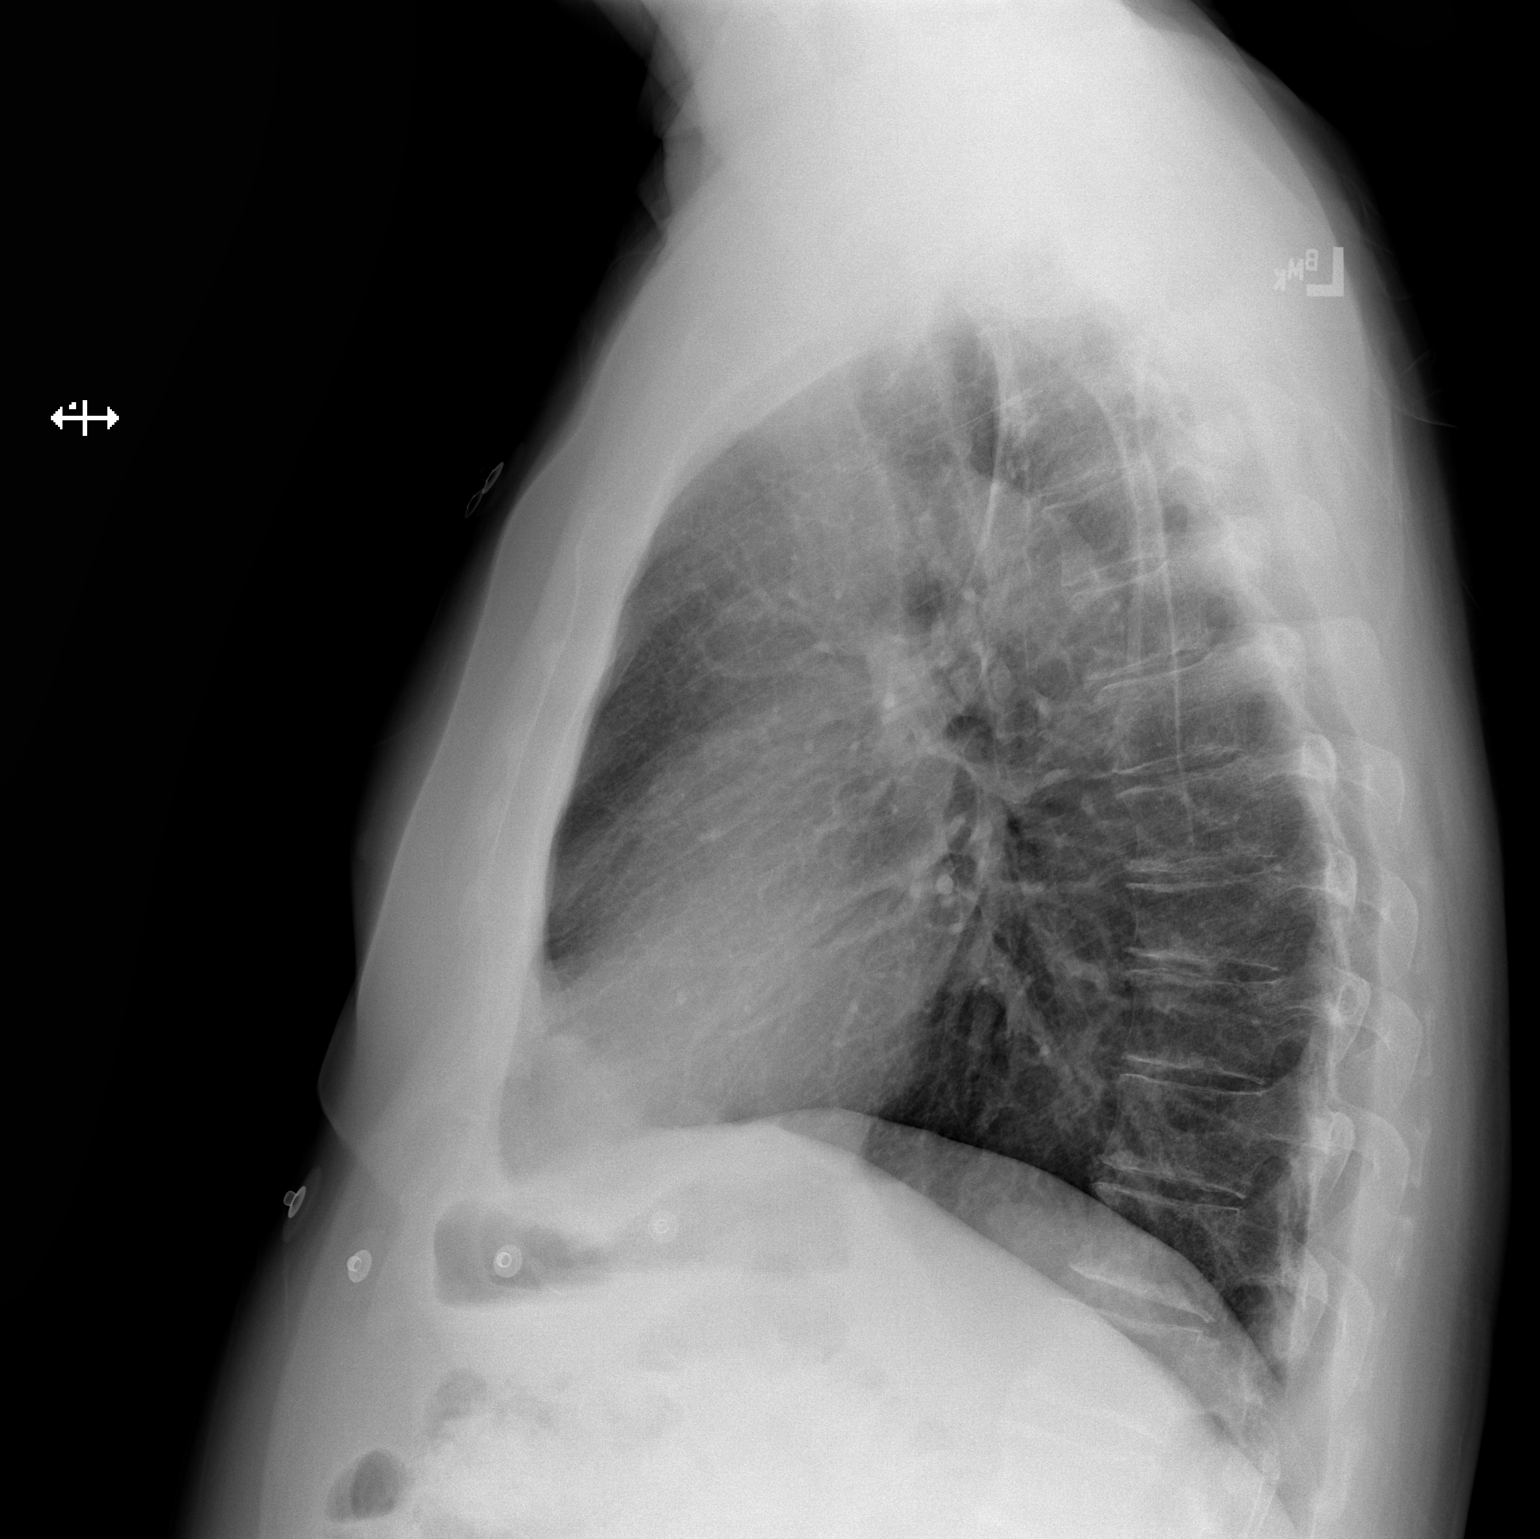

[2 of 2 positions shown; findings below may reference images not displayed]

FINDINGS: The heart size and mediastinal contours are within normal limits.
Both lungs are clear. Mild chronic wedging of mid to lower thoracic
vertebra
IMPRESSION: No active cardiopulmonary disease.

## 2022-03-27 ENCOUNTER — Other Ambulatory Visit: Payer: Self-pay | Admitting: Specialist

## 2022-03-27 NOTE — H&P (Signed)
PREOPERATIVE H&P  Chief Complaint: M18.12 Left primary osteoarth of first carpometacarp joints  HPI: Samuel Roberts is a 66 y.o. male who presents for preoperative history and physical with a diagnosis of M18.12 left primary osteoarth of first carpometacarp joints. Symptoms are rated as moderate to severe, and have been worsening.  He has failed conservative treatment including injections, splinting, and medications.  This is significantly impairing activities of daily living.  He has elected for surgical management.   Past Medical History:  Diagnosis Date   BPH (benign prostatic hyperplasia)    Colitis, ulcerative ([facility])    GERD (gastroesophageal reflux disease)    Hypertension    Past Surgical History:  Procedure Laterality Date   JOINT REPLACEMENT Right 02/17/2018   pins and tendon replacement.    KNEE ARTHROSCOPY AND ARTHROTOMY     LEFT HEART CATH AND CORONARY ANGIOGRAPHY Left 09/09/2016   Procedure: Left Heart Cath and Coronary Angiography;  Surgeon: Yolonda Kida, MD;  Location: [facility];  Service: Cardiovascular;  Laterality: Left;   LEFT HEART CATH AND CORONARY ANGIOGRAPHY Left 07/25/2020   Procedure: LEFT HEART CATH AND CORONARY ANGIOGRAPHY;  Surgeon: Yolonda Kida, MD;  Location: [facility];  Service: Cardiovascular;  Laterality: Left;   TONSILLECTOMY     Social History   Socioeconomic History   Marital status: Married    Spouse name: Not on file   Number of children: Not on file   Years of education: Not on file   Highest education level: Not on file  Occupational History   Not on file  Tobacco Use   Smoking status: Never   Smokeless tobacco: Never  Substance and Sexual Activity   Alcohol use: No   Drug use: No   Sexual activity: Not on file  Other Topics Concern   Not on file  Social History Narrative   Not on file   Social Determinants of Health   Financial Resource Strain: Not on file  Food Insecurity: Not on file   Transportation Needs: Not on file  Physical Activity: Not on file  Stress: Not on file  Social Connections: Not on file   Family History  Problem Relation Age of Onset   Heart failure Mother    Diabetes Mother    Heart failure Father    Cancer Father    Diabetes Sister    Cirrhosis Brother    Heart disease Brother    Heart disease Brother    Drug abuse Brother    Drug abuse Brother    Diabetes Sister    No Known Allergies Prior to Admission medications   Medication Sig Start Date End Date Taking? Authorizing Provider  finasteride (PROPECIA) 1 MG tablet Take 5 mg by mouth every other day.    [provider]  mesalamine (LIALDA) 1.2 g EC tablet Take 2.4 g by mouth daily with breakfast.    [provider]  pantoprazole (PROTONIX) 40 MG tablet Take 40 mg by mouth every other day.  08/04/16   [provider]  traMADol (ULTRAM) 50 MG tablet Take 1 tablet (50 mg total) by mouth 2 (two) times daily. Each refill must last 30 days. 11/08/21 05/07/22  Milinda Pointer, MD     Positive ROS: All other systems have been reviewed and were otherwise negative with the exception of those mentioned in the HPI and as above.  Physical Exam: General: Alert, no acute distress Cardiovascular: No pedal edema. Heart is regular and without murmur.  Respiratory: No cyanosis, no use of accessory musculature. Lungs are clear. GI: No organomegaly, abdomen is soft and non-tender Skin: No lesions in the area of chief complaint Neurologic: Sensation intact distally Psychiatric: Patient is competent for consent with normal mood and affect Lymphatic: No axillary or cervical lymphadenopathy  MUSCULOSKELETAL: Left thumb shows severe arthritis at the [facility] joint.  There is a positive grind test.  His pinch is weak.  Skins intact.  Neurovascular status is normal.  Median nerve compression is negative.  There is subluxation of the metacarpal.  The patient has a good palmaris longus tendon  for a graft.  Assessment: M18.12 left primary osteoarthritis of first carpometacarp joints  Plan: Plan for Procedure(s): ARTHROPLASTY, INTERPOSITION/INTERCARPAL/CMC JOINTS (SURG)  The risks benefits and alternatives were discussed with the patient including but not limited to the risks of nonoperative treatment, versus surgical intervention including infection, bleeding, nerve injury,  blood clots, cardiopulmonary complications, morbidity, mortality, among others, and they were willing to proceed.   Park Breed, MD [phone]   03/27/2022 10:26 AM

## 2022-04-10 ENCOUNTER — Inpatient Hospital Stay: Admission: RE | Admit: 2022-04-10 | Payer: BC Managed Care – PPO | Source: Ambulatory Visit

## 2022-04-17 ENCOUNTER — Ambulatory Visit: Admission: RE | Admit: 2022-04-17 | Payer: BC Managed Care – PPO | Source: Home / Self Care | Admitting: Specialist

## 2022-04-17 ENCOUNTER — Encounter: Admission: RE | Payer: Self-pay | Source: Home / Self Care

## 2022-04-17 SURGERY — CARPOMETACARPAL (CMC) FUSION OF THUMB
Anesthesia: General | Site: Thumb | Laterality: Left

## 2022-04-23 ENCOUNTER — Ambulatory Visit: Payer: BC Managed Care – PPO | Attending: Pain Medicine | Admitting: Pain Medicine

## 2022-04-23 ENCOUNTER — Encounter: Payer: Self-pay | Admitting: Pain Medicine

## 2022-04-23 VITALS — BP 142/97 | HR 88 | Temp 96.6°F | Ht 70.0 in | Wt 200.0 lb

## 2022-04-23 DIAGNOSIS — G8929 Other chronic pain: Secondary | ICD-10-CM

## 2022-04-23 DIAGNOSIS — Z79899 Other long term (current) drug therapy: Secondary | ICD-10-CM

## 2022-04-23 DIAGNOSIS — M25512 Pain in left shoulder: Secondary | ICD-10-CM | POA: Diagnosis not present

## 2022-04-23 DIAGNOSIS — Z79891 Long term (current) use of opiate analgesic: Secondary | ICD-10-CM

## 2022-04-23 DIAGNOSIS — M47816 Spondylosis without myelopathy or radiculopathy, lumbar region: Secondary | ICD-10-CM | POA: Diagnosis present

## 2022-04-23 DIAGNOSIS — M545 Low back pain, unspecified: Secondary | ICD-10-CM | POA: Diagnosis present

## 2022-04-23 DIAGNOSIS — M5481 Occipital neuralgia: Secondary | ICD-10-CM | POA: Diagnosis not present

## 2022-04-23 DIAGNOSIS — M542 Cervicalgia: Secondary | ICD-10-CM | POA: Diagnosis not present

## 2022-04-23 DIAGNOSIS — G894 Chronic pain syndrome: Secondary | ICD-10-CM

## 2022-04-23 MED ORDER — TRAMADOL HCL 50 MG PO TABS: 50.0000 mg | ORAL_TABLET | Freq: Two times a day (BID) | ORAL | 5 refills | Status: DC

## 2022-04-23 NOTE — Progress Notes (Signed)
PROVIDER NOTE: Information contained herein reflects review and annotations entered in association with encounter. Interpretation of such information and data should be left to medically-trained personnel. Information provided to patient can be located elsewhere in the medical record under "Patient Instructions". Document created using STT-dictation technology, any transcriptional errors that may result from process are unintentional.    Patient: Samuel Roberts  Service Category: E/M  Provider: Gaspar Cola, MD  DOB: 08-08-1955  DOS: 04/23/2022  Referring Provider: [name], MD  MRN: [id number]  Specialty: Interventional Pain Management  PCP: [name], MD  Type: Established Patient  Setting: Ambulatory outpatient    Location: Office  Delivery: Face-to-face     HPI  Samuel Roberts, a 67 y.o. year Roberts male, is here today because of his Chronic pain syndrome [G89.4]. Samuel Roberts primary complain today is Hand Burn (thumb) Last encounter: My last encounter with him was on 10/30/2021. 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 Pain Assessment: Severity of Chronic pain is reported as a 8 /10. Location: Finger (Comment which one) (thumb left) Left/Denies. Onset: More than a month ago. Quality: Throbbing, Aching. Timing: Constant. Modifying factor(s): Meds and hand support. Vitals:  height is _0  (1.778 m) and weight is 200 lb (90.7 kg). His temperature is 96.6 F (35.9 C) (abnormal). His blood pressure is 142/97 (abnormal) and his pulse is 88. His oxygen saturation is 100%.  BMI: Estimated body mass index is 28.7 kg/m as calculated from the following:   Height as of this encounter: _1  (1.778 m).   Weight as of this encounter: 200 lb (90.7 kg).  Reason for encounter: medication management.  The patient indicates doing well with the current medication regimen. No adverse reactions or side effects reported to the medications.   PMP: 6/6 refills written on 10/30/2021 filled.  Last: 04/09/2022.  RTCB: 11/05/2022   Last interventional therapy: 02/09/2020 consisted of right lumbar facet MBB.  Pharmacotherapy Assessment  Analgesic: Tramadol 50 mg, 1 tab PO BID (100 mg/day of tramadol) MME/day: 10 mg/day.   Monitoring: [place] PMP: PDMP reviewed during this encounter.       Pharmacotherapy: No side-effects or adverse reactions reported. Compliance: No problems identified. Effectiveness: Clinically acceptable.  Chauncey Fischer, RN  04/23/2022  9:11 AM  Sign when Signing Visit Nursing Pain Medication Assessment:  Safety precautions to be maintained throughout the outpatient stay will include: orient to surroundings, keep bed in low position, maintain call bell within reach at all times, provide assistance with transfer out of bed and ambulation.  Medication Inspection Compliance: Pill count conducted under aseptic conditions,  in front of the patient. Neither the pills nor  the bottle was removed from the patient's sight at any time. Once count was completed pills were immediately returned to the patient in their original bottle.  Medication: Tramadol (Ultram) Pill/Patch Count:  28 of 60 pills remain Pill/Patch Appearance: Markings consistent with prescribed medication Bottle Appearance: Standard pharmacy container. Clearly labeled. Filled Date: 104 / 20 / 2023 Last Medication intake:  TodaySafety precautions to be maintained throughout the outpatient stay will include: orient to surroundings, keep bed in low position, maintain call bell within reach at all times, provide assistance with transfer out of bed and ambulation.     No results found for: "CBDTHCR" No results found for: "D8THCCBX" No results found for: "D9THCCBX"  UDS:  Summary  Date Value Ref Range Status  10/30/2021 Note  Final    Comment:    ==================================================================== [employer] Select 13 (MW) ==================================================================== Test                             Result       Flag       Units  Drug Present and Declared for Prescription Verification   Tramadol                       >2688        EXPECTED   ng/mg creat   O-Desmethyltramadol            >2688        EXPECTED   ng/mg creat   N-Desmethyltramadol            702          EXPECTED   ng/mg creat    Source of tramadol is a prescription medication. O-desmethyltramadol    and N-desmethyltramadol are expected metabolites of tramadol.  Drug Present not Declared for Prescription Verification   Alpha-hydroxyalprazolam        27           UNEXPECTED ng/mg creat    Alpha-hydroxyalprazolam is an expected metabolite of alprazolam.    Source of alprazolam is a scheduled prescription medication.  ==================================================================== Test                      Result    Flag   Units      Ref Range    Creatinine              186              mg/dL      >=20 ==================================================================== Declared Medications:  The flagging and interpretation on this report are based on the  following declared medications.  Unexpected results may arise from  inaccuracies in the declared medications.   **Note: The testing scope of this panel includes these medications:   Tramadol (Ultram)   **Note: The testing scope of this panel does not include the  following reported medications:   Finasteride (Propecia)  Mesalamine (Lialda)  Pantoprazole (Protonix) ==================================================================== For clinical consultation, please call [phone]. ====================================================================       ROS  Constitutional: Denies any fever or chills Gastrointestinal: No reported hemesis, hematochezia, vomiting, or acute GI distress Musculoskeletal: Denies any acute onset joint swelling, redness, loss of ROM, or weakness Neurological: No reported episodes of acute onset apraxia, aphasia, dysarthria, agnosia, amnesia, paralysis, loss of coordination, or loss of consciousness  Medication Review  finasteride, mesalamine, pantoprazole, and traMADol  History Review  Allergy: Samuel Roberts has  No Known Allergies. Drug: Samuel Roberts  reports no history of drug use. Alcohol:  reports no history of alcohol use. Tobacco:  reports that he has never smoked. He has never used smokeless tobacco. Social: Samuel Roberts  reports that he has never smoked. He has never used smokeless tobacco. He reports that he does not drink alcohol and does not use drugs. 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 Surgical: Samuel Roberts  has a past surgical history that includes Knee arthroscopy and arthrotomy; Tonsillectomy; LEFT HEART  CATH AND CORONARY ANGIOGRAPHY (Left, 09/09/2016); Joint replacement (Right, 02/17/2018); and LEFT HEART CATH AND CORONARY ANGIOGRAPHY (Left, 07/25/2020). Family: family history includes Cancer in his father; Cirrhosis in his brother; Diabetes in his mother, sister, and sister; Drug abuse in his brother and brother; Heart disease in his brother and brother; Heart failure in his father and mother.  Laboratory Chemistry Profile   Renal Lab Results  Component Value Date   BUN 16 07/02/2020   CREATININE 0.74 07/02/2020   GFRAA >60 11/25/2019   GFRNONAA >60 07/02/2020    Hepatic Lab Results  Component Value Date   AST 29 07/02/2020   ALT 20 07/02/2020   ALBUMIN 3.9 07/02/2020   ALKPHOS 43 07/02/2020   LIPASE 27 07/02/2020    Electrolytes Lab Results  Component Value Date   NA 135 07/02/2020   K 3.5 07/02/2020   CL 105 07/02/2020   CALCIUM 8.8 (L) 07/02/2020   MG 2.4 (H) 06/24/2017    Bone Lab Results  Component Value Date   25OHVITD1 37 06/24/2017   25OHVITD2 2.2 06/24/2017   25OHVITD3 35 06/24/2017    Inflammation (CRP: Acute Phase) (ESR: Chronic Phase) Lab Results  Component Value Date   CRP 2.8 06/24/2017   ESRSEDRATE 11 06/24/2017         Note: Above Lab results reviewed.  Recent Imaging Review  US Venous Img Lower Unilateral Left (DVT) CLINICAL DATA:  Left knee pain.  EXAM: LEFT LOWER EXTREMITY VENOUS DOPPLER ULTRASOUND  TECHNIQUE: Gray-scale sonography with graded compression, as well as color Doppler and duplex ultrasound were performed to evaluate the lower extremity deep venous systems from the level of the common femoral vein and including the common femoral, femoral, profunda femoral, popliteal and calf veins including the posterior tibial, peroneal and gastrocnemius veins when visible. The superficial great saphenous vein was also interrogated. Spectral Doppler was utilized to evaluate flow at rest and with distal augmentation maneuvers in the common  femoral, femoral and popliteal veins.  COMPARISON:  None.  FINDINGS: Contralateral Common Femoral Vein: Respiratory phasicity is normal and symmetric with the symptomatic side. No evidence of thrombus. Normal compressibility.  Common Femoral Vein: No evidence of thrombus. Normal compressibility, respiratory phasicity and response to augmentation.  Saphenofemoral Junction: No evidence of thrombus. Normal compressibility and flow on color Doppler imaging.  Profunda Femoral Vein: No evidence of thrombus. Normal compressibility and flow on color Doppler imaging.  Femoral Vein: No evidence of thrombus. Normal compressibility, respiratory phasicity and response to augmentation.  Popliteal Vein: No evidence of thrombus. Normal compressibility, respiratory phasicity and response to augmentation.  Calf Veins: No evidence of thrombus. Normal compressibility and flow on color Doppler imaging.  Superficial Great Saphenous Vein: No evidence of thrombus. Normal compressibility.  Venous Reflux:  None.  Other Findings: No evidence of superficial thrombophlebitis or abnormal fluid collection.  IMPRESSION: No evidence of left lower extremity deep venous  thrombosis.  Electronically Signed   By: Aletta Edouard M.D.   On: 02/11/2021 15:26 Note: Reviewed        Physical Exam  General appearance: Well nourished, well developed, and well hydrated. In no apparent acute distress Mental status: Alert, oriented x 3 (person, place, & time)       Respiratory: No evidence of acute respiratory distress Eyes: PERLA Vitals: BP (!) 142/97   Pulse 88   Temp (!) 96.6 F (35.9 C)   Ht _0  (1.778 m)   Wt 200 lb (90.7 kg)   SpO2 100%   BMI 28.70 kg/m  BMI: Estimated body mass index is 28.7 kg/m as calculated from the following:   Height as of this encounter: _1  (1.778 m).   Weight as of this encounter: 200 lb (90.7 kg). Ideal: Ideal body weight: 73 kg (160 lb 15 oz) Adjusted ideal body  weight: 80.1 kg (176 lb 9 oz)  Assessment   Diagnosis Status  1. Chronic pain syndrome   2. Chronic neck pain (1ry area of Pain) (Bilateral) (L>R)   3. Chronic shoulder pain (2ry area of Pain) (Left)   4. Occipital neuralgia (3ry area of Pain) (Left)   5. Chronic low back pain (4th area of Pain) (Right) without sciatica   6. Lumbar facet joint syndrome (Bilateral)   7. Pharmacologic therapy   8. Chronic use of opiate for therapeutic purpose   9. Encounter for medication management   10. Encounter for chronic pain management    Controlled Controlled Controlled   Updated Problems: Problem  Crohn's Disease of Colon Without Complication (Hcc)    Plan of Care  Problem-specific:  No problem-specific Assessment & Plan notes found for this encounter.  Samuel Roberts has a current medication list which includes the following long-term medication(s): pantoprazole and [START ON 05/09/2022] tramadol.  Pharmacotherapy (Medications Ordered): Meds ordered this encounter  Medications   traMADol (ULTRAM) 50 MG tablet    Sig: Take 1 tablet (50 mg total) by mouth 2 (two) times daily. Each refill must last 30 days.    Dispense:  60 tablet    Refill:  5    DO NOT: delete (not duplicate); no partial-fill (will deny script to complete), no refill request (F/U required). DISPENSE: 1 day early if closed on fill date. WARN: No CNS-depressants within 8 hrs of med.   Orders:  No orders of the defined types were placed in this encounter.  Follow-up plan:   Return in about 28 weeks (around 11/05/2022) for Eval-day (M,W), (F2F), (MM).     Interventional Therapies  Risk Factors  Considerations:     Planned  Pending:   See above for possible orders   Under consideration:   Diagnostic left-sided CESI #3  Diagnostic right lumbar facet MBB #2    Completed:   Diagnostic right lumbar facet MBB x1 (02/09/2020) (100/100/100/90-100)  Diagnostic/therapeutic right PSIS TPI/MNB x1 (01/05/2020)  (100/100/0/0)  Therapeutic left C7-T1 cervical ESI x2 (10/29/2017) (100/100/60/75)    Completed by other providers:   None at this time   Therapeutic  Palliative (PRN) options:   Palliative left-sided CESI #3 (PRN)  Palliative right lumbar facet MBB      Recent Visits No visits were found meeting these conditions. Showing recent visits within past 90 days and meeting all other requirements Today's Visits Date Type Provider Dept  04/23/22 Office Visit Milinda Pointer, MD [facility]  Showing today's visits and meeting all other requirements Future Appointments  No visits were found meeting these conditions. Showing future appointments within next 90 days and meeting all other requirements  I discussed the assessment and treatment plan with the patient. The patient was provided an opportunity to ask questions and all were answered. The patient agreed with the plan and demonstrated an understanding of the instructions.  Patient advised to call back or seek an in-person evaluation if the symptoms or condition worsens.  Duration of encounter: 35 minutes.  Total time on encounter, as per AMA guidelines included both the face-to-face and non-face-to-face time personally spent by the physician and/or other qualified health care professional(s) on the day of the encounter (includes time in activities that require the physician or other qualified health care professional and does not include time in activities normally performed by clinical staff). Physician's time may include the following activities when performed: Preparing to see the patient (e.g., pre-charting review of records, searching for previously ordered imaging, lab work, and nerve conduction tests) Review of prior analgesic pharmacotherapies. Reviewing PMP Interpreting ordered tests (e.g., lab work, imaging, nerve conduction tests) Performing post-procedure evaluations, including interpretation of diagnostic  procedures Obtaining and/or reviewing separately obtained history Performing a medically appropriate examination and/or evaluation Counseling and educating the patient/family/caregiver Ordering medications, tests, or procedures Referring and communicating with other health care professionals (when not separately reported) Documenting clinical information in the electronic or other health record Independently interpreting results (not separately reported) and communicating results to the patient/ family/caregiver Care coordination (not separately reported)  Note by: Gaspar Cola, MD Date: 04/23/2022; Time: 9:24 AM

## 2022-04-23 NOTE — Progress Notes (Signed)
Nursing Pain Medication Assessment:  Safety precautions to be maintained throughout the outpatient stay will include: orient to surroundings, keep bed in low position, maintain call bell within reach at all times, provide assistance with transfer out of bed and ambulation.  Medication Inspection Compliance: Pill count conducted under aseptic conditions, in front of the patient. Neither the pills nor the bottle was removed from the patient's sight at any time. Once count was completed pills were immediately returned to the patient in their original bottle.  Medication: Tramadol (Ultram) Pill/Patch Count:  28 of 60 pills remain Pill/Patch Appearance: Markings consistent with prescribed medication Bottle Appearance: Standard pharmacy container. Clearly labeled. Filled Date: 81 / 20 / 2023 Last Medication intake:  TodaySafety precautions to be maintained throughout the outpatient stay will include: orient to surroundings, keep bed in low position, maintain call bell within reach at all times, provide assistance with transfer out of bed and ambulation.

## 2022-08-22 IMAGING — US US EXTREM LOW VENOUS*L*
1 series · 13 of 24 positions shown · non-contrast
Comparison: None.

CLINICAL DATA: Left knee pain.



[Series 1: us venous img lower uni left (dvt) · portal-venous · 13 of 32 slices shown]
[im 1/32]
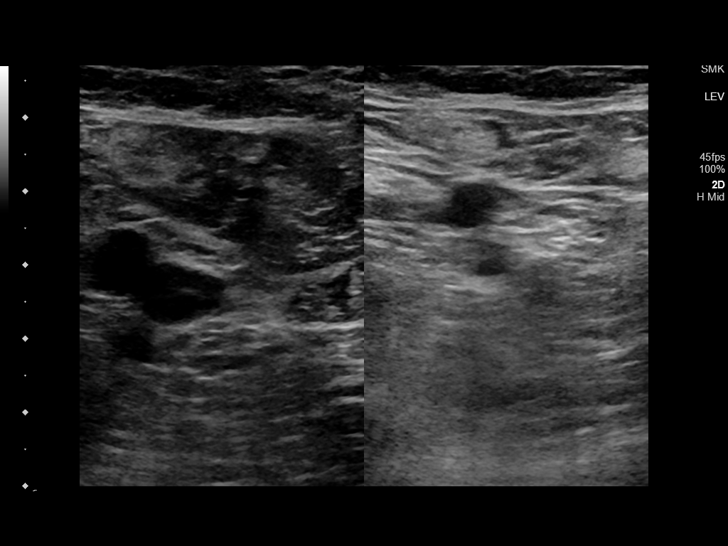
[im 3/32]
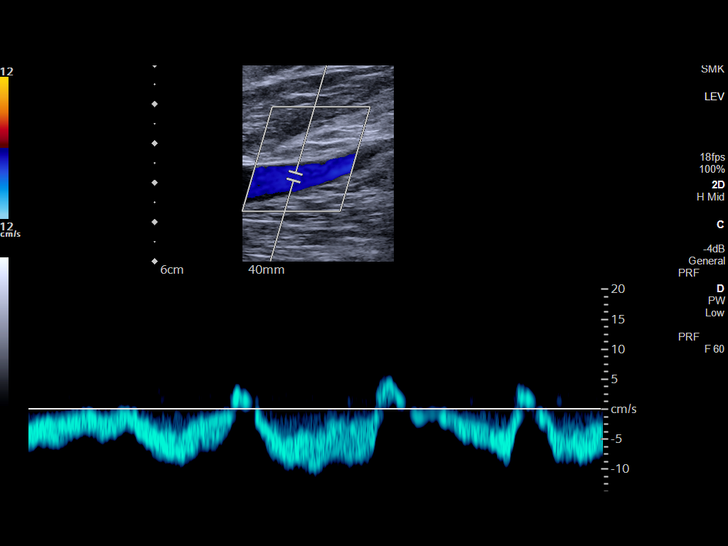
[im 6/32]
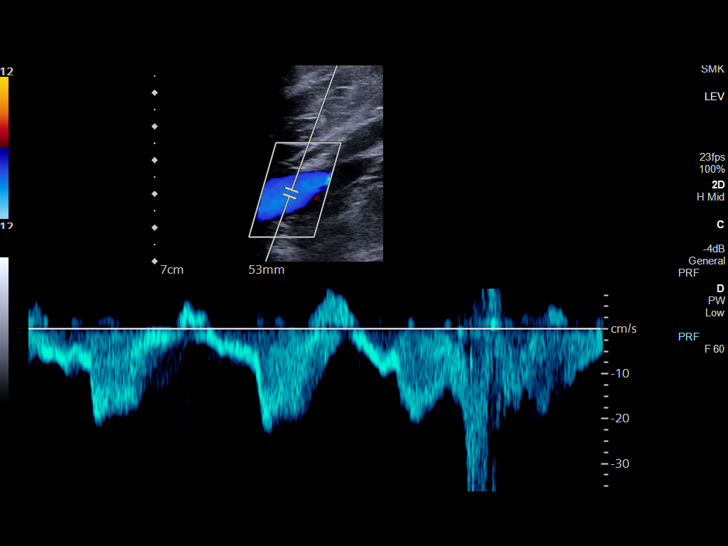
[im 9/32]
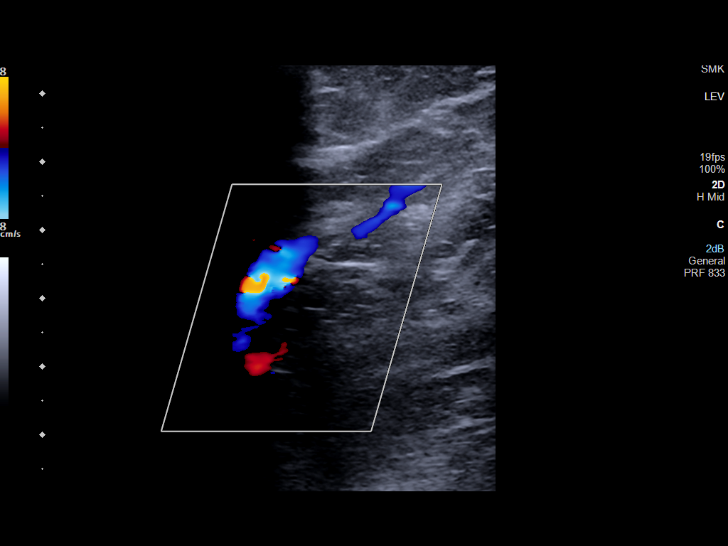
[im 11/32]
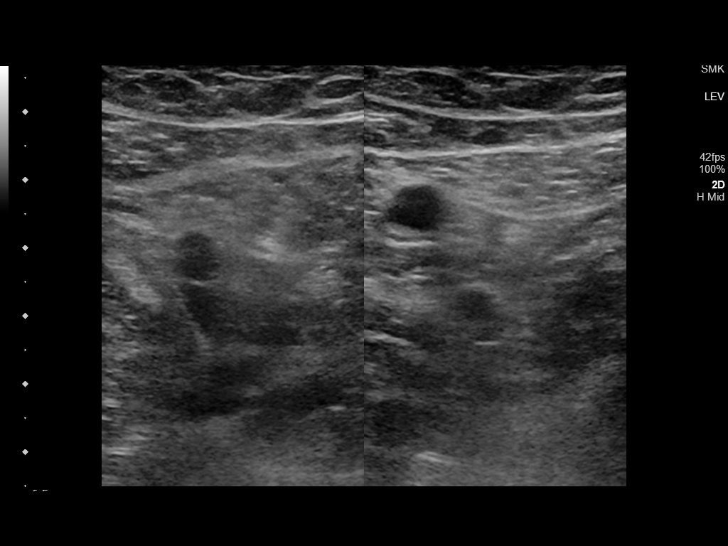
[im 14/32]
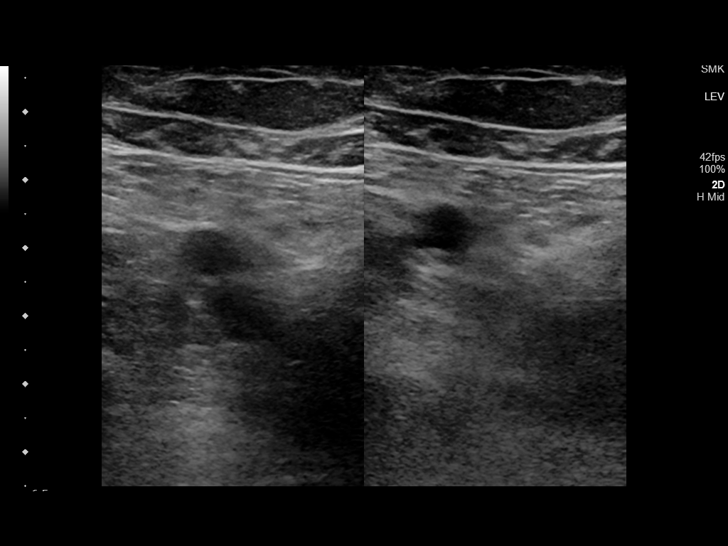
[im 17/32]
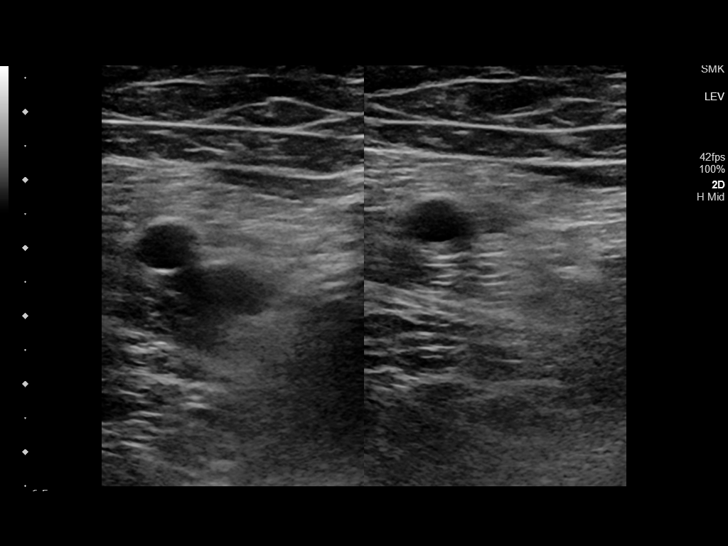
[im 18/32]
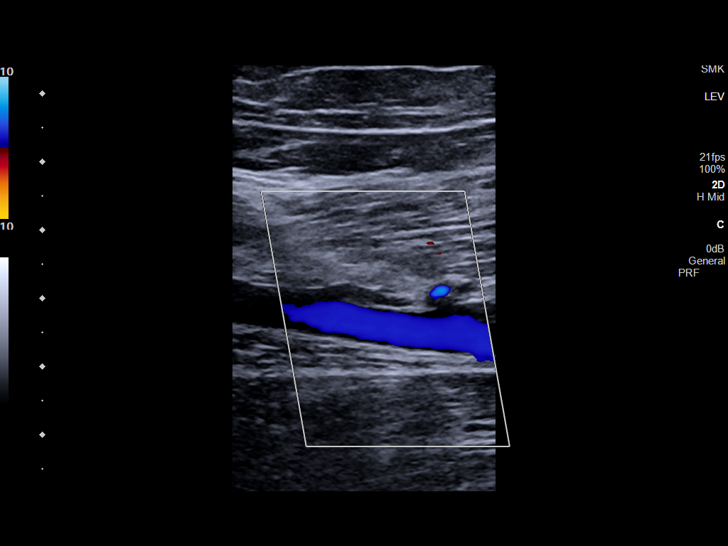
[im 21/32]
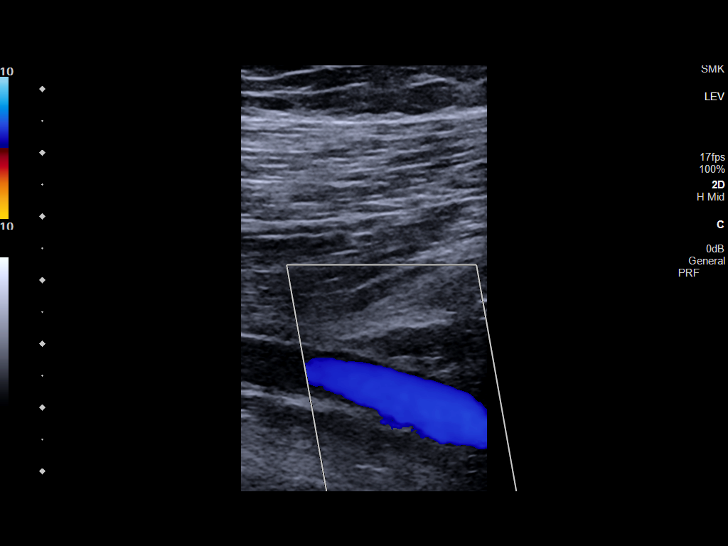
[im 23/32]
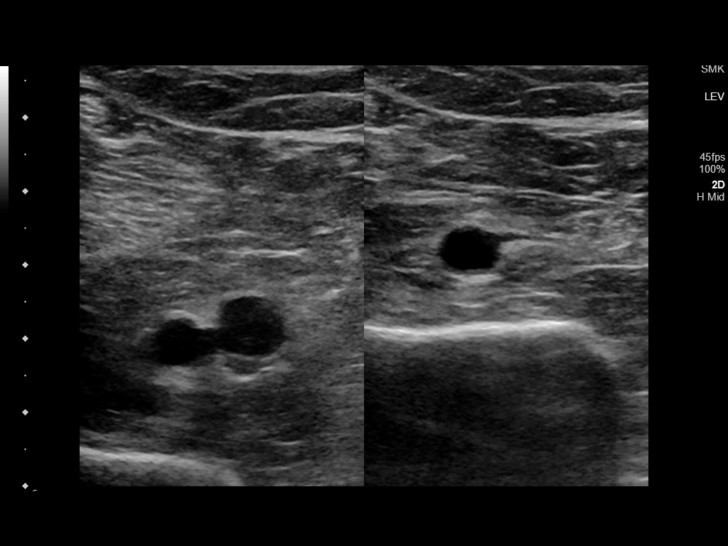
[im 26/32]
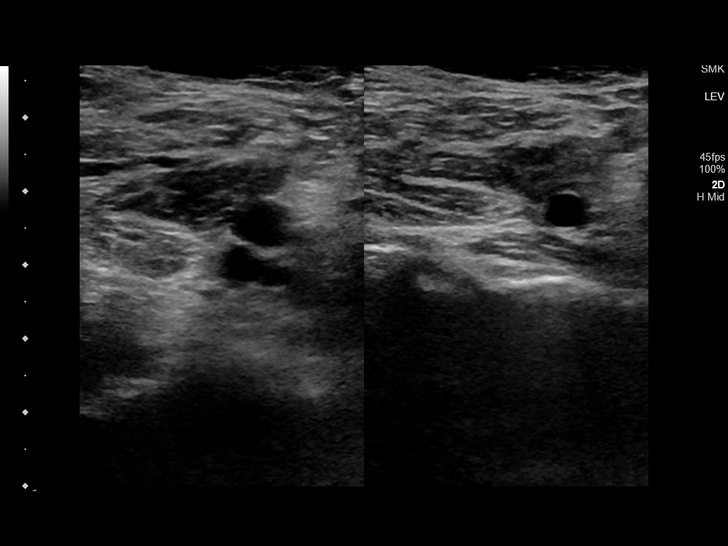
[im 29/32]
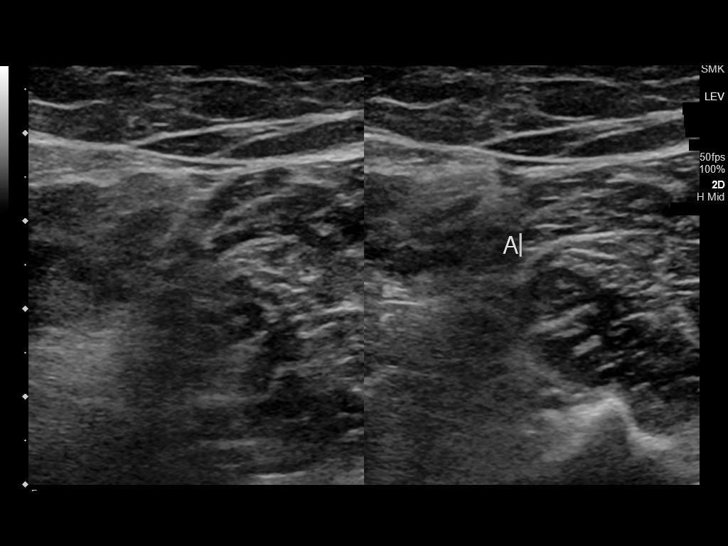
[im 32/32]
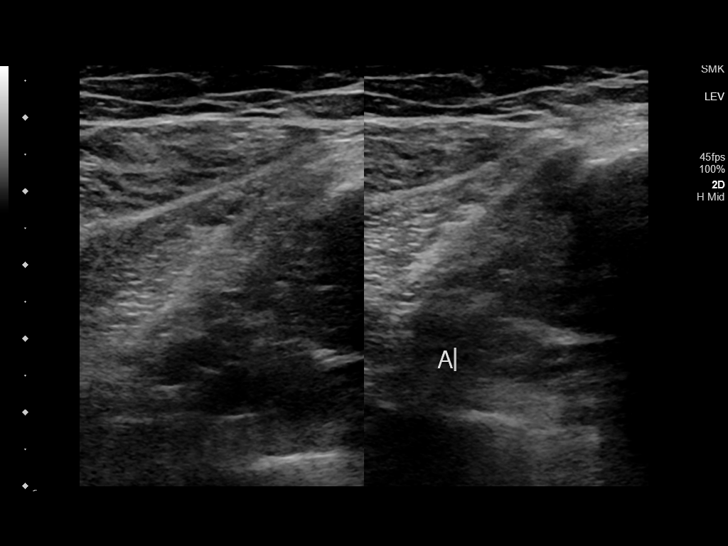

[13 of 24 positions shown; findings below may reference images not displayed]

FINDINGS: Contralateral Common Femoral Vein: Respiratory phasicity is normal
and symmetric with the symptomatic side. No evidence of thrombus.
Normal compressibility.

Common Femoral Vein: No evidence of thrombus. Normal
compressibility, respiratory phasicity and response to augmentation.

Saphenofemoral Junction: No evidence of thrombus. Normal
compressibility and flow on color Doppler imaging.

Profunda Femoral Vein: No evidence of thrombus. Normal
compressibility and flow on color Doppler imaging.

Femoral Vein: No evidence of thrombus. Normal compressibility,
respiratory phasicity and response to augmentation.

Popliteal Vein: No evidence of thrombus. Normal compressibility,
respiratory phasicity and response to augmentation.

Calf Veins: No evidence of thrombus. Normal compressibility and flow
on color Doppler imaging.

Superficial Great Saphenous Vein: No evidence of thrombus. Normal
compressibility.

Venous Reflux:  None.

Other Findings: No evidence of superficial thrombophlebitis or
abnormal fluid collection.
IMPRESSION: No evidence of left lower extremity deep venous thrombosis.

## 2022-10-28 NOTE — Patient Instructions (Signed)
____________________________________________________________________________________________  Opioid Pain Medication Update  To: All patients taking opioid pain medications. (I.e.: hydrocodone, hydromorphone, oxycodone, oxymorphone, morphine, codeine, methadone, tapentadol, tramadol, buprenorphine, fentanyl, etc.)  Re: Updated review of side effects and adverse reactions of opioid analgesics, as well as new information about long term effects of this class of medications.  Direct risks of long-term opioid therapy are not limited to opioid addiction and overdose. Potential medical risks include serious fractures, breathing problems during sleep, hyperalgesia, immunosuppression, chronic constipation, bowel obstruction, myocardial infarction, and tooth decay secondary to xerostomia.  Unpredictable adverse effects that can occur even if you take your medication correctly: Cognitive impairment, respiratory depression, and death. Most people think that if they take their medication "correctly", and "as instructed", that they will be safe. Nothing could be farther from the truth. In reality, a significant amount of recorded deaths associated with the use of opioids has occurred in individuals that had taken the medication for a long time, and were taking their medication correctly. The following are examples of how this can happen: Patient taking his/her medication for a long time, as instructed, without any side effects, is given a certain antibiotic or another unrelated medication, which in turn triggers a "Drug-to-drug interaction" leading to disorientation, cognitive impairment, impaired reflexes, respiratory depression or an untoward event leading to serious bodily harm or injury, including death.  Patient taking his/her medication for a long time, as instructed, without any side effects, develops an acute impairment of liver and/or kidney function. This will lead to a rapid inability of the body to  breakdown and eliminate their pain medication, which will result in effects similar to an "overdose", but with the same medicine and dose that they had always taken. This again may lead to disorientation, cognitive impairment, impaired reflexes, respiratory depression or an untoward event leading to serious bodily harm or injury, including death.  A similar problem will occur with patients as they grow older and their liver and kidney function begins to decrease as part of the aging process.  Background information: Historically, the original case for using long-term opioid therapy to treat chronic noncancer pain was based on safety assumptions that subsequent experience has called into question. In 1996, the American Pain Society and the American Academy of Pain Medicine issued a consensus statement supporting long-term opioid therapy. This statement acknowledged the dangers of opioid prescribing but concluded that the risk for addiction was low; respiratory depression induced by opioids was short-lived, occurred mainly in opioid-naive patients, and was antagonized by pain; tolerance was not a common problem; and efforts to control diversion should not constrain opioid prescribing. This has now proven to be wrong. Experience regarding the risks for opioid addiction, misuse, and overdose in community practice has failed to support these assumptions.  According to the Centers for Disease Control and Prevention, fatal overdoses involving opioid analgesics have increased sharply over the past decade. Currently, more than 96,700 people die from drug overdoses every year. Opioids are a factor in 7 out of every 10 overdose deaths. Deaths from drug overdose have surpassed motor vehicle accidents as the leading cause of death for individuals between the ages of 35 and 54.  Clinical data suggest that neuroendocrine dysfunction may be very common in both men and women, potentially causing hypogonadism, erectile  dysfunction, infertility, decreased libido, osteoporosis, and depression. Recent studies linked higher opioid dose to increased opioid-related mortality. Controlled observational studies reported that long-term opioid therapy may be associated with increased risk for cardiovascular events. Subsequent meta-analysis concluded   that the safety of long-term opioid therapy in elderly patients has not been proven.   Side Effects and adverse reactions: Common side effects: Drowsiness (sedation). Dizziness. Nausea and vomiting. Constipation. Physical dependence -- Dependence often manifests with withdrawal symptoms when opioids are discontinued or decreased. Tolerance -- As you take repeated doses of opioids, you require increased medication to experience the same effect of pain relief. Respiratory depression -- This can occur in healthy people, especially with higher doses. However, people with COPD, asthma or other lung conditions may be even more susceptible to fatal respiratory impairment.  Uncommon side effects: An increased sensitivity to feeling pain and extreme response to pain (hyperalgesia). Chronic use of opioids can lead to this. Delayed gastric emptying (the process by which the contents of your stomach are moved into your small intestine). Muscle rigidity. Immune system and hormonal dysfunction. Quick, involuntary muscle jerks (myoclonus). Arrhythmia. Itchy skin (pruritus). Dry mouth (xerostomia).  Long-term side effects: Chronic constipation. Sleep-disordered breathing (SDB). Increased risk of bone fractures. Hypothalamic-pituitary-adrenal dysregulation. Increased risk of overdose.  RISKS: Respiratory depression and death: Opioids increase the risk of respiratory depression and death.  Drug-to-drug interactions: Opioids are relatively contraindicated in combination with benzodiazepines, sleep inducers, and other central nervous system depressants. Other classes of medications  (i.e.: certain antibiotics and even over-the-counter medications) may also trigger or induce respiratory depression in some patients.  Medical conditions: Patients with pre-existing respiratory problems are at higher risk of respiratory failure and/or depression when in combination with opioid analgesics. Opioids are relatively contraindicated in some medical conditions such as central sleep apnea.   Fractures and Falls:  Opioids increase the risk and incidence of falls. This is of particular importance in elderly patients.  Endocrine System:  Long-term administration is associated with endocrine abnormalities (endocrinopathies). (Also known as Opioid-induced Endocrinopathy) Influences on both the hypothalamic-pituitary-adrenal axis?and the hypothalamic-pituitary-gonadal axis have been demonstrated with consequent hypogonadism and adrenal insufficiency in both sexes. Hypogonadism and decreased levels of dehydroepiandrosterone sulfate have been reported in men and women. Endocrine effects include: Amenorrhoea in women (abnormal absence of menstruation) Reduced libido in both sexes Decreased sexual function Erectile dysfunction in men Hypogonadisms (decreased testicular function with shrinkage of testicles) Infertility Depression and fatigue Loss of muscle mass Anxiety Depression Immune suppression Hyperalgesia Weight gain Anemia Osteoporosis Patients (particularly women of childbearing age) should avoid opioids. There is insufficient evidence to recommend routine monitoring of asymptomatic patients taking opioids in the long-term for hormonal deficiencies.  Immune System: Human studies have demonstrated that opioids have an immunomodulating effect. These effects are mediated via opioid receptors both on immune effector cells and in the central nervous system. Opioids have been demonstrated to have adverse effects on antimicrobial response and anti-tumour surveillance. Buprenorphine has  been demonstrated to have no impact on immune function.  Opioid Induced Hyperalgesia: Human studies have demonstrated that prolonged use of opioids can lead to a state of abnormal pain sensitivity, sometimes called opioid induced hyperalgesia (OIH). Opioid induced hyperalgesia is not usually seen in the absence of tolerance to opioid analgesia. Clinically, hyperalgesia may be diagnosed if the patient on long-term opioid therapy presents with increased pain. This might be qualitatively and anatomically distinct from pain related to disease progression or to breakthrough pain resulting from development of opioid tolerance. Pain associated with hyperalgesia tends to be more diffuse than the pre-existing pain and less defined in quality. Management of opioid induced hyperalgesia requires opioid dose reduction.  Cancer: Chronic opioid therapy has been associated with an increased risk of cancer   among noncancer patients with chronic pain. This association was more evident in chronic strong opioid users. Chronic opioid consumption causes significant pathological changes in the small intestine and colon. Epidemiological studies have found that there is a link between opium dependence and initiation of gastrointestinal cancers. Cancer is the second leading cause of death after cardiovascular disease. Chronic use of opioids can cause multiple conditions such as GERD, immunosuppression and renal damage as well as carcinogenic effects, which are associated with the incidence of cancers.   Mortality: Long-term opioid use has been associated with increased mortality among patients with chronic non-cancer pain (CNCP).  Prescription of long-acting opioids for chronic noncancer pain was associated with a significantly increased risk of all-cause mortality, including deaths from causes other than overdose.  Reference: Von Korff M, Kolodny A, Deyo RA, Chou R. Long-term opioid therapy reconsidered. Ann Intern Med. 2011  Sep 6;155(5):325-8. doi: 10.7326/[id number]. PMID: 21893626; PMCID: PMC3280085. Bedson J, Chen Y, Ashworth J, Hayward RA, Dunn KM, Jordan KP. Risk of adverse events in patients prescribed long-term opioids: A cohort study in the [place] Clinical Practice Research Datalink. Eur J Pain. 2019 May;23(5):908-922. doi: 10.1002/ejp.1357. Epub 2019 Jan 31. PMID: 30620116. Colameco S, Coren JS, Ciervo CA. Continuous opioid treatment for chronic noncancer pain: a time for moderation in prescribing. Postgrad Med. 2009 Jul;121(4):61-6. doi: 10.3810/pgm.2009.07.2032. PMID: 19641271. Chou R, Turner JA, Devine EB, Hansen RN, Sullivan SD, Blazina I, Dana T, Bougatsos C, Deyo RA. The effectiveness and risks of long-term opioid therapy for chronic pain: a systematic review for a National Institutes of Health Pathways to Prevention Workshop. Ann Intern Med. 2015 Feb 17;162(4):276-86. doi: 10.7326/M14-2559. PMID: 25581257. Warner M, Chen LH, Makuc DM. NCHS Data Brief No. 22. [place]: Centers for Disease Control and Prevention; 2009. Sep, Increase in Fatal Poisonings Involving Opioid Analgesics in the [place], 1999-2006. Song IA, Choi HR, Oh TK. Long-term opioid use and mortality in patients with chronic non-cancer pain: Ten-year follow-up study in [place] from 2010 through 2019. EClinicalMedicine. 2022 Jul 18;51:101558. doi: 10.1016/j.eclinm.2022.101558. PMID: 35875817; PMCID: PMC9304910. Huser, W., Schubert, T., Vogelmann, T. et al. All-cause mortality in patients with long-term opioid therapy compared with non-opioid analgesics for chronic non-cancer pain: a database study. BMC Med 18, 162 (2020). https://doi.org/10.1186/[email or web address] Rashidian H, Zendehdel K, Kamangar F, Malekzadeh R, Haghdoost AA. An Ecological Study of the Association between Opiate Use and Incidence of Cancers. Addict Health. 2016 Fall;8(4):252-260. PMID: 28819556; PMCID: PMC5554805.  Our Goal: Our goal is to control your  pain with means other than the use of opioid pain medications.  Our Recommendation: Talk to your physician about coming off of these medications. We can assist you with the tapering down and stopping these medicines. Based on the new information, even if you cannot completely stop the medication, a decrease in the dose may be associated with a lesser risk. Ask for other means of controlling the pain. Decrease or eliminate those factors that significantly contribute to your pain such as smoking, obesity, and a diet heavily tilted towards "inflammatory" nutrients.  Last Updated: 10/27/2022   ____________________________________________________________________________________________     ____________________________________________________________________________________________  Transfer of Pain Medication between Pharmacies  Re: 2023 DEA Clarification on existing regulation  Published on DEA Website: December 20, 2021  Title: Revised Regulation Allows DEA-Registered Pharmacies to Transfer Electronic Prescriptions at a Patient's Request DEA Headquarters Division - Public Information Office  "Patients now have the ability to request their electronic prescription be transferred to another pharmacy without having to go back to their practitioner to initiate the   request. This revised regulation went into effect on Monday, December 16, 2021.     At a patient's request, a DEA-registered retail pharmacy can now transfer an electronic prescription for a controlled substance (schedules II-V) to another DEA-registered retail pharmacy. Prior to this change, patients would have to go through their practitioner to cancel their prescription and have it re-issued to a different pharmacy. The process was taxing and time consuming for both patients and practitioners.    The Drug Enforcement Administration (DEA) published its intent to revise the process for transferring electronic prescriptions on March 09, 2020.  The final rule was published in the federal register on November 14, 2021 and went into effect 30 days later.  Under the final rule, a prescription can only be transferred once between pharmacies, and only if allowed under existing state or other applicable law. The prescription must remain in its electronic form; may not be altered in any way; and the transfer must be communicated directly between two licensed pharmacists. It's important to note, any authorized refills transfer with the original prescription, which means the entire prescription will be filled at the same pharmacy."    REFERENCES: 1. DEA website announcement https://www.dea.gov/stories/2023/2023-12/2021-09-01/revised-regulation-allows-dea-registered-pharmacies-transfer  2. Department of Justice website  https://www.govinfo.gov/content/pkg/FR-2021-11-14/pdf/2023-15847.pdf  3. DEPARTMENT OF JUSTICE Drug Enforcement Administration 21 CFR Part 1306 [Docket No. DEA-637] RIN [id number] "Transfer of Electronic Prescriptions for Schedules II-V Controlled Substances Between Pharmacies for Initial Filling"  ____________________________________________________________________________________________     _______________________________________________________________________  Medication Rules  Purpose: To inform patients, and their family members, of our medication rules and regulations.  Applies to: All patients receiving prescriptions from our practice (written or electronic).  Pharmacy of record: This is the pharmacy where your electronic prescriptions will be sent. Make sure we have the correct one.  Electronic prescriptions: In compliance with the [place] Strengthen Opioid Misuse Prevention (STOP) Act of 2017 (Session Law 2017-74/H243), effective April 21, 2018, all controlled substances must be electronically prescribed. Written prescriptions, faxing, or calling prescriptions to a pharmacy will no longer be  done.  Prescription refills: These will be provided only during in-person appointments. No medications will be renewed without a "face-to-face" evaluation with your provider. Applies to all prescriptions.  NOTE: The following applies primarily to controlled substances (Opioid* Pain Medications).   Type of encounter (visit): For patients receiving controlled substances, face-to-face visits are required. (Not an option and not up to the patient.)  Patient's responsibilities: Pain Pills: Bring all pain pills to every appointment (except for procedure appointments). Pill Bottles: Bring pills in original pharmacy bottle. Bring bottle, even if empty. Always bring the bottle of the most recent fill.  Medication refills: You are responsible for knowing and keeping track of what medications you are taking and when is it that you will need a refill. The day before your appointment: write a list of all prescriptions that need to be refilled. The day of the appointment: give the list to the admitting nurse. Prescriptions will be written only during appointments. No prescriptions will be written on procedure days. If you forget a medication: it will not be "Called in", "Faxed", or "electronically sent". You will need to get another appointment to get these prescribed. No early refills. Do not call asking to have your prescription filled early. Partial  or short prescriptions: Occasionally your pharmacy may not have enough pills to fill your prescription.  NEVER ACCEPT a partial fill or a prescription that is short of the total amount of pills that you were prescribed.    With controlled substances the law allows 72 hours for the pharmacy to complete the prescription.  If the prescription is not completed within 72 hours, the pharmacist will require a new prescription to be written. This means that you will be short on your medicine and we WILL NOT send another prescription to complete your original prescription.   Instead, request the pharmacy to send a carrier to a nearby branch to get enough medication to provide you with your full prescription. Prescription Accuracy: You are responsible for carefully inspecting your prescriptions before leaving our office. Have the discharge nurse carefully go over each prescription with you, before taking them home. Make sure that your name is accurately spelled, that your address is correct. Check the name and dose of your medication to make sure it is accurate. Check the number of pills, and the written instructions to make sure they are clear and accurate. Make sure that you are given enough medication to last until your next medication refill appointment. Taking Medication: Take medication as prescribed. When it comes to controlled substances, taking less pills or less frequently than prescribed is permitted and encouraged. Never take more pills than instructed. Never take the medication more frequently than prescribed.  Inform other Doctors: Always inform, all of your healthcare providers, of all the medications you take. Pain Medication from other Providers: You are not allowed to accept any additional pain medication from any other Doctor or Healthcare provider. There are two exceptions to this rule. (see below) In the event that you require additional pain medication, you are responsible for notifying us, as stated below. Cough Medicine: Often these contain an opioid, such as codeine or hydrocodone. Never accept or take cough medicine containing these opioids if you are already taking an opioid* medication. The combination may cause respiratory failure and death. Medication Agreement: You are responsible for carefully reading and following our Medication Agreement. This must be signed before receiving any prescriptions from our practice. Safely store a copy of your signed Agreement. Violations to the Agreement will result in no further prescriptions. (Additional copies of  our Medication Agreement are available upon request.) Laws, Rules, & Regulations: All patients are expected to follow all Federal and State Laws, Statutes, Rules, & Regulations. Ignorance of the Laws does not constitute a valid excuse.  Illegal drugs and Controlled Substances: The use of illegal substances (including, but not limited to marijuana and its derivatives) and/or the illegal use of any controlled substances is strictly prohibited. Violation of this rule may result in the immediate and permanent discontinuation of any and all prescriptions being written by our practice. The use of any illegal substances is prohibited. Adopted CDC guidelines & recommendations: Target dosing levels will be at or below 60 MME/day. Use of benzodiazepines** is not recommended.  Exceptions: There are only two exceptions to the rule of not receiving pain medications from other Healthcare Providers. Exception #1 (Emergencies): In the event of an emergency (i.e.: accident requiring emergency care), you are allowed to receive additional pain medication. However, you are responsible for: As soon as you are able, call our office [phone], at any time of the day or night, and leave a message stating your name, the date and nature of the emergency, and the name and dose of the medication prescribed. In the event that your call is answered by a member of our staff, make sure to document and save the date, time, and the name of the person that took your information.  Exception #2 (  Planned Surgery): In the event that you are scheduled by another doctor or dentist to have any type of surgery or procedure, you are allowed (for a period no longer than 30 days), to receive additional pain medication, for the acute post-op pain. However, in this case, you are responsible for picking up a copy of our "Post-op Pain Management for Surgeons" handout, and giving it to your surgeon or dentist. This document is available at our office,  and does not require an appointment to obtain it. Simply go to our office during business hours (Monday-Thursday from 8:00 AM to 4:00 PM) (Friday 8:00 AM to 12:00 Noon) or if you have a scheduled appointment with us, prior to your surgery, and ask for it by name. In addition, you are responsible for: calling our office [phone], at any time of the day or night, and leaving a message stating your name, name of your surgeon, type of surgery, and date of procedure or surgery. Failure to comply with your responsibilities may result in termination of therapy involving the controlled substances. Medication Agreement Violation. Following the above rules, including your responsibilities will help you in avoiding a Medication Agreement Violation ("Breaking your Pain Medication Contract").  Consequences:  Not following the above rules may result in permanent discontinuation of medication prescription therapy.  *Opioid medications include: morphine, codeine, oxycodone, oxymorphone, hydrocodone, hydromorphone, meperidine, tramadol, tapentadol, buprenorphine, fentanyl, methadone. **Benzodiazepine medications include: diazepam (Valium), alprazolam (Xanax), clonazepam (Klonopine), lorazepam (Ativan), clorazepate (Tranxene), chlordiazepoxide (Librium), estazolam (Prosom), oxazepam (Serax), temazepam (Restoril), triazolam (Halcion) (Last updated: 02/11/2022) ______________________________________________________________________    ______________________________________________________________________  Medication Recommendations and Reminders  Applies to: All patients receiving prescriptions (written and/or electronic).  Medication Rules & Regulations: You are responsible for reading, knowing, and following our "Medication Rules" document. These exist for your safety and that of others. They are not flexible and neither are we. Dismissing or ignoring them is an act of "non-compliance" that may result in  complete and irreversible termination of such medication therapy. For safety reasons, "non-compliance" will not be tolerated. As with the [place] fundamental legal principle of "ignorance of the law is no defense", we will accept no excuses for not having read and knowing the content of documents provided to you by our practice.  Pharmacy of record:  Definition: This is the pharmacy where your electronic prescriptions will be sent.  We do not endorse any particular pharmacy. It is up to you and your insurance to decide what pharmacy to use.  We do not restrict you in your choice of pharmacy. However, once we write for your prescriptions, we will NOT be re-sending more prescriptions to fix restricted supply problems created by your pharmacy, or your insurance.  The pharmacy listed in the electronic medical record should be the one where you want electronic prescriptions to be sent. If you choose to change pharmacy, simply notify our nursing staff. Changes will be made only during your regular appointments and not over the phone.  Recommendations: Keep all of your pain medications in a safe place, under lock and key, even if you live alone. We will NOT replace lost, stolen, or damaged medication. We do not accept "Police Reports" as proof of medications having been stolen. After you fill your prescription, take 1 week's worth of pills and put them away in a safe place. You should keep a separate, properly labeled bottle for this purpose. The remainder should be kept in the original bottle. Use this as your primary supply, until it runs out.   Once it's gone, then you know that you have 1 week's worth of medicine, and it is time to come in for a prescription refill. If you do this correctly, it is unlikely that you will ever run out of medicine. To make sure that the above recommendation works, it is very important that you make sure your medication refill appointments are scheduled at least 1 week before you  run out of medicine. To do this in an effective manner, make sure that you do not leave the office without scheduling your next medication management appointment. Always ask the nursing staff to show you in your prescription , when your medication will be running out. Then arrange for the receptionist to get you a return appointment, at least 7 days before you run out of medicine. Do not wait until you have 1 or 2 pills left, to come in. This is very poor planning and does not take into consideration that we may need to cancel appointments due to bad weather, sickness, or emergencies affecting our staff. DO NOT ACCEPT A "Partial Fill": If for any reason your pharmacy does not have enough pills/tablets to completely fill or refill your prescription, do not allow for a "partial fill". The law allows the pharmacy to complete that prescription within 72 hours, without requiring a new prescription. If they do not fill the rest of your prescription within those 72 hours, you will need a separate prescription to fill the remaining amount, which we will NOT provide. If the reason for the partial fill is your insurance, you will need to talk to the pharmacist about payment alternatives for the remaining tablets, but again, DO NOT ACCEPT A PARTIAL FILL, unless you can trust your pharmacist to obtain the remainder of the pills within 72 hours.  Prescription refills and/or changes in medication(s):  Prescription refills, and/or changes in dose or medication, will be conducted only during scheduled medication management appointments. (Applies to both, written and electronic prescriptions.) No refills on procedure days. No medication will be changed or started on procedure days. No changes, adjustments, and/or refills will be conducted on a procedure day. Doing so will interfere with the diagnostic portion of the procedure. No phone refills. No medications will be "called into the pharmacy". No Fax refills. No weekend  refills. No Holliday refills. No after hours refills.  Remember:  Business hours are:  Monday to Thursday 8:00 AM to 4:00 PM Provider's Schedule: Deseri Loss, MD - Appointments are:  Medication management: Monday and Wednesday 8:00 AM to 4:00 PM Procedure day: Tuesday and Thursday 7:30 AM to 4:00 PM Bilal Lateef, MD - Appointments are:  Medication management: Tuesday and Thursday 8:00 AM to 4:00 PM Procedure day: Monday and Wednesday 7:30 AM to 4:00 PM (Last update: 02/11/2022) ______________________________________________________________________   ____________________________________________________________________________________________  Naloxone Nasal Spray  Why am I receiving this medication? [place] STOP ACT requires that all patients taking high dose opioids or at risk of opioids respiratory depression, be prescribed an opioid reversal agent, such as Naloxone (AKA: Narcan).  What is this medication? NALOXONE (nal OX one) treats opioid overdose, which causes slow or shallow breathing, severe drowsiness, or trouble staying awake. Call emergency services after using this medication. You may need additional treatment. Naloxone works by reversing the effects of opioids. It belongs to a group of medications called opioid blockers.  COMMON BRAND NAME(S): Kloxxado, Narcan  What should I tell my care team before I take this medication? They need to know if you have   any of these conditions: Heart disease Substance use disorder An unusual or allergic reaction to naloxone, other medications, foods, dyes, or preservatives Pregnant or trying to get pregnant Breast-feeding  When to use this medication? This medication is to be used for the treatment of respiratory depression (less than 8 breaths per minute) secondary to opioid overdose.   How to use this medication? This medication is for use in the nose. Lay the person on their back. Support their neck with your hand  and allow the head to tilt back before giving the medication. The nasal spray should be given into 1 nostril. After giving the medication, move the person onto their side. Do not remove or test the nasal spray until ready to use. Get emergency medical help right away after giving the first dose of this medication, even if the person wakes up. You should be familiar with how to recognize the signs and symptoms of a narcotic overdose. If more doses are needed, give the additional dose in the other nostril. Talk to your care team about the use of this medication in children. While this medication may be prescribed for children as young as newborns for selected conditions, precautions do apply.  Naloxone Overdosage: If you think you have taken too much of this medicine contact a poison control center or emergency room at once.  NOTE: This medicine is only for you. Do not share this medicine with others.  What if I miss a dose? This does not apply.  What may interact with this medication? This is only used during an emergency. No interactions are expected during emergency use. This list may not describe all possible interactions. Give your health care provider a list of all the medicines, herbs, non-prescription drugs, or dietary supplements you use. Also tell them if you smoke, drink alcohol, or use illegal drugs. Some items may interact with your medicine.  What should I watch for while using this medication? Keep this medication ready for use in the case of an opioid overdose. Make sure that you have the phone number of your care team and local hospital ready. You may need to have additional doses of this medication. Each nasal spray contains a single dose. Some emergencies may require additional doses. After use, bring the treated person to the nearest hospital or call 911. Make sure the treating care team knows that the person has received a dose of this medication. You will receive additional  instructions on what to do during and after use of this medication before an emergency occurs.  What side effects may I notice from receiving this medication? Side effects that you should report to your care team as soon as possible: Allergic reactions--skin rash, itching, hives, swelling of the face, lips, tongue, or throat Side effects that usually do not require medical attention (report these to your care team if they continue or are bothersome): Constipation Dryness or irritation inside the nose Headache Increase in blood pressure Muscle spasms Stuffy nose Toothache This list may not describe all possible side effects. Call your doctor for medical advice about side effects. You may report side effects to FDA at 1-800-FDA-1088.  Where should I keep my medication? Because this is an emergency medication, you should keep it with you at all times.  Keep out of the reach of children and pets. Store between 20 and 25 degrees C (68 and 77 degrees F). Do not freeze. Throw away any unused medication after the expiration date. Keep in original box   until ready to use.  NOTE: This sheet is a summary. It may not cover all possible information. If you have questions about this medicine, talk to your doctor, pharmacist, or health care provider.   2023 Elsevier/Gold Standard (2020-12-14 00:00:00)  ____________________________________________________________________________________________   

## 2022-10-28 NOTE — Progress Notes (Unsigned)
PROVIDER NOTE: Information contained herein reflects review and annotations entered in association with encounter. Interpretation of such information and data should be left to medically-trained personnel. Information provided to patient can be located elsewhere in the medical record under "Patient Instructions". Document created using STT-dictation technology, any transcriptional errors that may result from process are unintentional.    Patient: Samuel Roberts  Service Category: E/M  Provider: Oswaldo Done, MD  DOB: 02/24/1956  DOS: 10/29/2022  Referring Provider: Dorothey Baseman, MD  MRN: [id number]  Specialty: Interventional Pain Management  PCP: Dorothey Baseman, MD  Type: Established Patient  Setting: Ambulatory outpatient    Location: Office  Delivery: Face-to-face     HPI  Samuel Roberts, a 67 y.o. year old male, is here today because of his Chronic pain syndrome [G89.4]. Mr. Kohles primary complain today is No chief complaint on file.  Pertinent problems: Mr. 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 Pain Assessment: Severity of   is reported as a  /10. Location:    / . Onset:  . Quality:  . Timing:  . Modifying factor(s):  Marland Kitchen Vitals:  vitals were not taken for this visit.  BMI: Estimated body mass index is 28.7 kg/m as calculated from the following:   Height as of 04/23/22: 5\' 10"  (1.778 m).   Weight as of 04/23/22: 200 lb (90.7 kg). Last encounter: 04/23/2022. Last procedure: Visit date not found.  Reason for encounter: medication management. ***  Routine UDS ordered today.   RTCB: 05/04/2023   Pharmacotherapy Assessment  Analgesic: Tramadol 50 mg, 1 tab PO BID (100 mg/day of tramadol) MME/day: 10 mg/day.   Monitoring: [place] PMP: PDMP reviewed during this encounter.       Pharmacotherapy: No side-effects or adverse reactions reported. Compliance: No problems identified. Effectiveness: Clinically acceptable.  No notes on file  No results found for: "CBDTHCR" No results found for: "D8THCCBX" No results found for: "D9THCCBX"  UDS:  Summary  Date Value Ref Range Status  10/30/2021 Note  Final    Comment:    ==================================================================== [employer] Select 13 (MW) ==================================================================== Test                             Result       Flag       Units  Drug Present and Declared for Prescription Verification   Tramadol                       >2688        EXPECTED   ng/mg creat   O-Desmethyltramadol            >  2688        EXPECTED   ng/mg creat   N-Desmethyltramadol            702          EXPECTED   ng/mg creat    Source of tramadol is a prescription medication. O-desmethyltramadol    and N-desmethyltramadol are expected metabolites of tramadol.  Drug Present not Declared for Prescription Verification   Alpha-hydroxyalprazolam        27            UNEXPECTED ng/mg creat    Alpha-hydroxyalprazolam is an expected metabolite of alprazolam.    Source of alprazolam is a scheduled prescription medication.  ==================================================================== Test                      Result    Flag   Units      Ref Range   Creatinine              186              mg/dL      >=16 ==================================================================== Declared Medications:  The flagging and interpretation on this report are based on the  following declared medications.  Unexpected results may arise from  inaccuracies in the declared medications.   **Note: The testing scope of this panel includes these medications:   Tramadol (Ultram)   **Note: The testing scope of this panel does not include the  following reported medications:   Finasteride (Propecia)  Mesalamine (Lialda)  Pantoprazole (Protonix) ==================================================================== For clinical consultation, please call [phone]. ====================================================================       ROS  Constitutional: Denies any fever or chills Gastrointestinal: No reported hemesis, hematochezia, vomiting, or acute GI distress Musculoskeletal: Denies any acute onset joint swelling, redness, loss of ROM, or weakness Neurological: No reported episodes of acute onset apraxia, aphasia, dysarthria, agnosia, amnesia, paralysis, loss of coordination, or loss of consciousness  Medication Review  finasteride, mesalamine, pantoprazole, and traMADol  History Review  Allergy: Samuel Roberts has No Known Allergies. Drug: Samuel Roberts  reports no history of drug use. Alcohol:  reports no history of alcohol use. Tobacco:  reports that he has never smoked. He has never used smokeless tobacco. Social: Samuel Roberts  reports that he has never smoked. He has never used smokeless tobacco. He reports that he does not drink alcohol and does not  use drugs. Medical:  has a past medical history of BPH (benign prostatic hyperplasia), Colitis, ulcerative (HCC), GERD (gastroesophageal reflux disease), and Hypertension. Surgical: Samuel Roberts  has a past surgical history that includes Knee arthroscopy and arthrotomy; Tonsillectomy; LEFT HEART CATH AND CORONARY ANGIOGRAPHY (Left, 09/09/2016); Joint replacement (Right, 02/17/2018); and LEFT HEART CATH AND CORONARY ANGIOGRAPHY (Left, 07/25/2020). Family: family history includes Cancer in his father; Cirrhosis in his brother; Diabetes in his mother, sister, and sister; Drug abuse in his brother and brother; Heart disease in his brother and brother; Heart failure in his father and mother.  Laboratory Chemistry Profile   Renal Lab Results  Component Value Date   BUN 16 07/02/2020   CREATININE 0.74 07/02/2020   GFRAA >60 11/25/2019   GFRNONAA >60 07/02/2020    Hepatic Lab Results  Component Value Date   AST 29 07/02/2020   ALT 20 07/02/2020   ALBUMIN 3.9 07/02/2020   ALKPHOS 43 07/02/2020   LIPASE 27 07/02/2020    Electrolytes Lab Results  Component Value Date   NA 135 07/02/2020   K 3.5  07/02/2020   CL 105 07/02/2020   CALCIUM 8.8 (L) 07/02/2020   MG 2.4 (H) 06/24/2017    Bone Lab Results  Component Value Date   25OHVITD1 37 06/24/2017   25OHVITD2 2.2 06/24/2017   25OHVITD3 35 06/24/2017    Inflammation (CRP: Acute Phase) (ESR: Chronic Phase) Lab Results  Component Value Date   CRP 2.8 06/24/2017   ESRSEDRATE 11 06/24/2017         Note: Above Lab results reviewed.  Recent Imaging Review  US Venous Img Lower Unilateral Left (DVT) CLINICAL DATA:  Left knee pain.  EXAM: LEFT LOWER EXTREMITY VENOUS DOPPLER ULTRASOUND  TECHNIQUE: Gray-scale sonography with graded compression, as well as color Doppler and duplex ultrasound were performed to evaluate the lower extremity deep venous systems from the level of the common femoral vein and including the common femoral, femoral,  profunda femoral, popliteal and calf veins including the posterior tibial, peroneal and gastrocnemius veins when visible. The superficial great saphenous vein was also interrogated. Spectral Doppler was utilized to evaluate flow at rest and with distal augmentation maneuvers in the common femoral, femoral and popliteal veins.  COMPARISON:  None.  FINDINGS: Contralateral Common Femoral Vein: Respiratory phasicity is normal and symmetric with the symptomatic side. No evidence of thrombus. Normal compressibility.  Common Femoral Vein: No evidence of thrombus. Normal compressibility, respiratory phasicity and response to augmentation.  Saphenofemoral Junction: No evidence of thrombus. Normal compressibility and flow on color Doppler imaging.  Profunda Femoral Vein: No evidence of thrombus. Normal compressibility and flow on color Doppler imaging.  Femoral Vein: No evidence of thrombus. Normal compressibility, respiratory phasicity and response to augmentation.  Popliteal Vein: No evidence of thrombus. Normal compressibility, respiratory phasicity and response to augmentation.  Calf Veins: No evidence of thrombus. Normal compressibility and flow on color Doppler imaging.  Superficial Great Saphenous Vein: No evidence of thrombus. Normal compressibility.  Venous Reflux:  None.  Other Findings: No evidence of superficial thrombophlebitis or abnormal fluid collection.  IMPRESSION: No evidence of left lower extremity deep venous thrombosis.  Electronically Signed   By: Irish Lack M.D.   On: 02/11/2021 15:26 Note: Reviewed        Physical Exam  General appearance: Well nourished, well developed, and well hydrated. In no apparent acute distress Mental status: Alert, oriented x 3 (person, place, & time)       Respiratory: No evidence of acute respiratory distress Eyes: PERLA Vitals: There were no vitals taken for this visit. BMI: Estimated body mass index is 28.7 kg/m  as calculated from the following:   Height as of 04/23/22: 5\' 10"  (1.778 m).   Weight as of 04/23/22: 200 lb (90.7 kg). Ideal: Patient weight not recorded  Assessment   Diagnosis Status  1. Chronic pain syndrome   2. Chronic neck pain (1ry area of Pain) (Bilateral) (L>R)   3. Chronic shoulder pain (2ry area of Pain) (Left)   4. Occipital neuralgia (3ry area of Pain) (Left)   5. Chronic low back pain (4th area of Pain) (Right) without sciatica   6. Lumbar facet joint syndrome (Bilateral)   7. Pharmacologic therapy   8. Chronic use of opiate for therapeutic purpose   9. Encounter for medication management   10. Encounter for chronic pain management    Controlled Controlled Controlled   Updated Problems: No problems updated.  Plan of Care  Problem-specific:  No problem-specific Assessment & Plan notes found for this encounter.  Mr. PAYTEN BEAUMIER has a current medication  list which includes the following long-term medication(s): pantoprazole and tramadol.  Pharmacotherapy (Medications Ordered): No orders of the defined types were placed in this encounter.  Orders:  No orders of the defined types were placed in this encounter.  Follow-up plan:   No follow-ups on file.      Interventional Therapies  Risk Factors  Considerations:     Planned  Pending:   See above for possible orders   Under consideration:   Diagnostic left-sided CESI #3  Diagnostic right lumbar facet MBB #2    Completed:   Diagnostic right lumbar facet MBB x1 (02/09/2020) (100/100/100/90-100)  Diagnostic/therapeutic right PSIS TPI/MNB x1 (01/05/2020) (100/100/0/0)  Therapeutic left C7-T1 cervical ESI x2 (10/29/2017) (100/100/60/75)    Completed by other providers:   None at this time   Therapeutic  Palliative (PRN) options:   Palliative left-sided CESI #3 (PRN)  Palliative right lumbar facet MBB        Recent Visits No visits were found meeting these conditions. Showing recent visits  within past 90 days and meeting all other requirements Future Appointments Date Type Provider Dept  10/29/22 Appointment Delano Metz, MD [facility]  Showing future appointments within next 90 days and meeting all other requirements  I discussed the assessment and treatment plan with the patient. The patient was provided an opportunity to ask questions and all were answered. The patient agreed with the plan and demonstrated an understanding of the instructions.  Patient advised to call back or seek an in-person evaluation if the symptoms or condition worsens.  Duration of encounter: *** minutes.  Total time on encounter, as per AMA guidelines included both the face-to-face and non-face-to-face time personally spent by the physician and/or other qualified health care professional(s) on the day of the encounter (includes time in activities that require the physician or other qualified health care professional and does not include time in activities normally performed by clinical staff). Physician's time may include the following activities when performed: Preparing to see the patient (e.g., pre-charting review of records, searching for previously ordered imaging, lab work, and nerve conduction tests) Review of prior analgesic pharmacotherapies. Reviewing PMP Interpreting ordered tests (e.g., lab work, imaging, nerve conduction tests) Performing post-procedure evaluations, including interpretation of diagnostic procedures Obtaining and/or reviewing separately obtained history Performing a medically appropriate examination and/or evaluation Counseling and educating the patient/family/caregiver Ordering medications, tests, or procedures Referring and communicating with other health care professionals (when not separately reported) Documenting clinical information in the electronic or other health record Independently interpreting results (not separately reported) and communicating  results to the patient/ family/caregiver Care coordination (not separately reported)  Note by: Oswaldo Done, MD Date: 10/29/2022; Time: 7:47 AM

## 2022-10-29 ENCOUNTER — Ambulatory Visit: Payer: BC Managed Care – PPO | Attending: Pain Medicine | Admitting: Pain Medicine

## 2022-10-29 ENCOUNTER — Encounter: Payer: Self-pay | Admitting: Pain Medicine

## 2022-10-29 VITALS — BP 140/99 | HR 71 | Temp 98.7°F | Resp 18 | Ht 70.0 in | Wt 200.0 lb

## 2022-10-29 DIAGNOSIS — M5481 Occipital neuralgia: Secondary | ICD-10-CM | POA: Insufficient documentation

## 2022-10-29 DIAGNOSIS — Z79891 Long term (current) use of opiate analgesic: Secondary | ICD-10-CM | POA: Diagnosis present

## 2022-10-29 DIAGNOSIS — G8929 Other chronic pain: Secondary | ICD-10-CM | POA: Diagnosis present

## 2022-10-29 DIAGNOSIS — Z79899 Other long term (current) drug therapy: Secondary | ICD-10-CM | POA: Diagnosis present

## 2022-10-29 DIAGNOSIS — M545 Low back pain, unspecified: Secondary | ICD-10-CM | POA: Diagnosis present

## 2022-10-29 DIAGNOSIS — G894 Chronic pain syndrome: Secondary | ICD-10-CM | POA: Diagnosis present

## 2022-10-29 DIAGNOSIS — M542 Cervicalgia: Secondary | ICD-10-CM | POA: Diagnosis not present

## 2022-10-29 DIAGNOSIS — M47816 Spondylosis without myelopathy or radiculopathy, lumbar region: Secondary | ICD-10-CM | POA: Diagnosis present

## 2022-10-29 DIAGNOSIS — M25512 Pain in left shoulder: Secondary | ICD-10-CM | POA: Insufficient documentation

## 2022-10-29 MED ORDER — TRAMADOL HCL 50 MG PO TABS: 50.0000 mg | ORAL_TABLET | Freq: Two times a day (BID) | ORAL | 5 refills | Status: DC

## 2022-10-29 MED ORDER — NALOXONE HCL 4 MG/0.1ML NA LIQD
1.0000 | NASAL | 0 refills | Status: AC | PRN
Start: 2022-10-29 — End: 2023-10-29

## 2022-10-29 NOTE — Progress Notes (Signed)
Nursing Pain Medication Assessment:  Safety precautions to be maintained throughout the outpatient stay will include: orient to surroundings, keep bed in low position, maintain call bell within reach at all times, provide assistance with transfer out of bed and ambulation.  Medication Inspection Compliance: Pill count conducted under aseptic conditions, in front of the patient. Neither the pills nor the bottle was removed from the patient's sight at any time. Once count was completed pills were immediately returned to the patient in their original bottle.  Medication: Tramadol (Ultram) Pill/Patch Count:  10 of 60 patches remain Pill/Patch Appearance: Markings consistent with prescribed medication Bottle Appearance: Standard pharmacy container. Clearly labeled. Filled Date: 6 / 77 / 2024 Last Medication intake:  Today

## 2022-10-31 LAB — TOXASSURE SELECT 13 (MW), URINE

## 2022-12-05 ENCOUNTER — Other Ambulatory Visit: Payer: Self-pay | Admitting: Internal Medicine

## 2022-12-05 DIAGNOSIS — R0789 Other chest pain: Secondary | ICD-10-CM

## 2022-12-09 ENCOUNTER — Ambulatory Visit
Admission: RE | Admit: 2022-12-09 | Discharge: 2022-12-09 | Disposition: A | Discharge status: Home / Self Care | Payer: Self-pay | Source: Ambulatory Visit | Attending: Internal Medicine | Admitting: Internal Medicine

## 2022-12-09 ENCOUNTER — Other Ambulatory Visit: Payer: Self-pay | Admitting: Internal Medicine

## 2022-12-09 DIAGNOSIS — R0789 Other chest pain: Secondary | ICD-10-CM | POA: Insufficient documentation

## 2022-12-16 ENCOUNTER — Telehealth (HOSPITAL_COMMUNITY): Payer: Self-pay | Admitting: *Deleted

## 2022-12-16 MED ORDER — METOPROLOL TARTRATE 100 MG PO TABS: ORAL_TABLET | ORAL | 0 refills | Status: AC

## 2022-12-16 NOTE — Telephone Encounter (Signed)
Reaching out to patient to offer assistance regarding upcoming cardiac imaging study; pt verbalizes understanding of appt date/time, parking situation and where to check in, pre-test NPO status and medications ordered, and verified current allergies; name and call back number provided for further questions should they arise  Larey Brick RN Navigator Cardiac Imaging [facility] [phone] office [phone] cell  Patient to hold his Imdur and metoprolol succiante and take 100mg  metoprolol tartrate two hours prior to his cardiac CT scan.

## 2022-12-17 ENCOUNTER — Ambulatory Visit
Admission: RE | Admit: 2022-12-17 | Discharge: 2022-12-17 | Disposition: A | Discharge status: Home / Self Care | Payer: Medicare HMO | Source: Ambulatory Visit | Attending: Internal Medicine | Admitting: Internal Medicine

## 2022-12-17 DIAGNOSIS — I2510 Atherosclerotic heart disease of native coronary artery without angina pectoris: Secondary | ICD-10-CM | POA: Diagnosis not present

## 2022-12-17 DIAGNOSIS — R0789 Other chest pain: Secondary | ICD-10-CM | POA: Diagnosis not present

## 2022-12-17 MED ORDER — IOHEXOL 350 MG/ML SOLN
80.0000 mL | Freq: Once | INTRAVENOUS | Status: AC | PRN
  Administered 2022-12-17: 80 mL via INTRAVENOUS

## 2022-12-17 MED ORDER — NITROGLYCERIN 0.4 MG SL SUBL
0.8000 mg | SUBLINGUAL_TABLET | Freq: Once | SUBLINGUAL | Status: AC
  Administered 2022-12-17: 0.8 mg via SUBLINGUAL
  Filled 2022-12-17: qty 25

## 2022-12-17 NOTE — Progress Notes (Signed)
Pt tolerated procedure well with no issues. Pt ABCs intact. Pt denies any complaints. Pt encouraged to drink plenty of water throughout the day. Pt ambulatory with steady gait.  

## 2023-04-27 NOTE — Progress Notes (Signed)
 PROVIDER NOTE: Information contained herein reflects review and annotations entered in association with encounter. Interpretation of such information and data should be left to medically-trained personnel. Information provided to patient can be located elsewhere in the medical record under Patient Instructions. Document created using STT-dictation technology, any transcriptional errors that may result from process are unintentional.    Patient: Samuel Roberts  Service Category: E/M  Provider: Eric DELENA Como, MD  DOB: 27-Jan-1956  DOS: 04/29/2023  Referring Provider: Glover Lenis, MD  MRN: [id number]  Specialty: Interventional Pain Management  PCP: Glover Lenis, MD  Type: Established Patient  Setting: Ambulatory outpatient    Location: Office  Delivery: Face-to-face     HPI  [name], a 68 y.o. year old male, is here today because of his No primary diagnosis found.. [name] primary complain today is Knee Pain (right) and Back Pain (lower)  Pertinent problems: Samuel Roberts has Carcinoma in situ of prostate; Arthropathy of knee (Right); Chronic neck pain (1ry area of Pain) (Bilateral) (L>R); Occipital neuralgia (3ry area of Pain) (Left); Chronic shoulder pain (2ry area of Pain) (Left); Chronic low back pain (4th area of Pain) (Right) without sciatica; Chronic knee pain (5th area of Pain) (Bilateral) (R>L); Chronic pain syndrome; Pain of left humerus; Chronic upper extremity pain (Left); DDD (degenerative disc disease), cervical; Cervical spondylosis; 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 Pain Assessment: Severity of Chronic pain is reported as a 5 /10. Location: Knee Right/denies. Onset: More than a month ago. Quality: Sore, Tightness, Sharp. Timing: Intermittent. Modifying factor(s): heating pad, ice. Vitals:  height is 5' 10 (1.778 m) and weight is 200 lb (90.7 kg). His temporal temperature is 97.2 F (36.2 C) (abnormal). His blood pressure is 140/96 (abnormal) and his pulse is 81. His respiration is 16 and oxygen saturation is 98%.  BMI: Estimated body mass index is 28.7 kg/m as calculated from the following:   Height as of this encounter: 5' 10 (1.778 m).   Weight as of this encounter: 200 lb (90.7 kg). Last encounter: 10/29/2022. Last procedure: Visit date not found.  Reason for encounter: medication management.  The patient indicates doing well with the current medication regimen. No adverse reactions or side effects reported to the medications.   Discussed the use of AI scribe software for clinical note transcription with the patient, who gave verbal consent to proceed.  History of Present Illness   The patient, with a history of chronic knee pain and intermittent lower back pain, reports no adverse reactions to his current medication regimen. The knee pain is constant, while the back pain is sporadic and localized to the right lower quadrant. The back pain does not radiate and is not associated with numbness or weakness in the lower extremities. The patient notes that the back pain is often triggered by bending over and resolves spontaneously.  The patient is on a daily regimen of Tramadol  for pain management, which he reports taking consistently. He denies any other issues with his medication. The patient's urine drug screening and prescription monitoring program checks are up-to-date and within normal limits.  He  brought his medication for pill count as required.     RTCB: 10/31/2023   Pharmacotherapy Assessment  Analgesic: Tramadol  50 mg, 1 tab PO BID (100 mg/day of tramadol ) MME/day: 10 mg/day.   Monitoring: [place] PMP: PDMP reviewed during this encounter.       Pharmacotherapy: No side-effects or adverse reactions reported. Compliance: No problems identified. Effectiveness: Clinically acceptable.  Shela Reda CROME, RN  04/29/2023  8:12 AM  Sign when Signing Visit Nursing Pain Medication Assessment:  Safety precautions to be maintained throughout the outpatient stay will include: orient to surroundings, keep bed in low position, maintain call bell within reach at all times, provide assistance with transfer out of bed and ambulation.  Medication Inspection Compliance: Pill count conducted under aseptic conditions, in front of the patient. Neither the pills nor the bottle was removed from the patient's sight at any time. Once count was completed pills were immediately returned to the patient in their original bottle.  Medication: Tramadol  (Ultram ) Pill/Patch Count:  9 of 60 pills remain Pill/Patch Appearance: Markings consistent with prescribed medication Bottle Appearance: Standard pharmacy container. Clearly labeled. Filled Date: 29 / 12 / 2024 Last Medication intake:  Today    No results found for: CBDTHCR No results found for: D8THCCBX No results found for: D9THCCBX  UDS:  Summary  Date Value Ref Range Status  10/29/2022 Note  Final    Comment:    ==================================================================== [employer] Select 13 (MW) ==================================================================== Test                             Result       Flag       Units  Drug Present and Declared for Prescription Verification   Tramadol                        >2041        EXPECTED   ng/mg creat   O-Desmethyltramadol            >2041        EXPECTED   ng/mg creat    N-Desmethyltramadol            804          EXPECTED   ng/mg creat    Source of tramadol  is a prescription medication. O-desmethyltramadol    and N-desmethyltramadol are expected metabolites of tramadol .  ==================================================================== Test                      Result    Flag   Units      Ref Range   Creatinine              245              mg/dL      >=79 ==================================================================== Declared Medications:  The flagging and interpretation on this report are based on the  following declared medications.  Unexpected results may arise from  inaccuracies in the declared medications.   **Note: The testing scope of this panel includes these medications:   Tramadol  (Ultram )   **Note: The testing scope of this panel does not include the  following reported medications:   Finasteride  (Propecia )  Mesalamine  (Lialda )  Naloxone  (Narcan )  Pantoprazole  (Protonix ) ==================================================================== For clinical consultation, please call [phone]. ====================================================================       ROS  Constitutional: Denies any fever or chills Gastrointestinal: No reported hemesis, hematochezia, vomiting, or  acute GI distress Musculoskeletal: Denies any acute onset joint swelling, redness, loss of ROM, or weakness Neurological: No reported episodes of acute onset apraxia, aphasia, dysarthria, agnosia, amnesia, paralysis, loss of coordination, or loss of consciousness  Medication Review  finasteride , mesalamine , metoprolol  tartrate, naloxone , pantoprazole , and traMADol   History Review  Allergy: Samuel Roberts has no known allergies. Drug: Samuel Roberts  reports no history of drug use. Alcohol:  reports no history of alcohol use. Tobacco:  reports that he has never smoked. He has never used smokeless tobacco. Social: Samuel Roberts  reports that he has never  smoked. He has never used smokeless tobacco. He reports that he does not drink alcohol and does not use drugs. Medical:  has a past medical history of BPH (benign prostatic hyperplasia), Colitis, ulcerative (HCC), GERD (gastroesophageal reflux disease), and Hypertension. Surgical: Samuel Roberts  has a past surgical history that includes Knee arthroscopy and arthrotomy; Tonsillectomy; LEFT HEART CATH AND CORONARY ANGIOGRAPHY (Left, 09/09/2016); Joint replacement (Right, 02/17/2018); and LEFT HEART CATH AND CORONARY ANGIOGRAPHY (Left, 07/25/2020). Family: family history includes Cancer in his father; Cirrhosis in his brother; Diabetes in his mother, sister, and sister; Drug abuse in his brother and brother; Heart disease in his brother and brother; Heart failure in his father and mother.  Laboratory Chemistry Profile   Renal Lab Results  Component Value Date   BUN 16 07/02/2020   CREATININE 0.74 07/02/2020   GFRAA >60 11/25/2019   GFRNONAA >60 07/02/2020    Hepatic Lab Results  Component Value Date   AST 29 07/02/2020   ALT 20 07/02/2020   ALBUMIN 3.9 07/02/2020   ALKPHOS 43 07/02/2020   LIPASE 27 07/02/2020    Electrolytes Lab Results  Component Value Date   NA 135 07/02/2020   K 3.5 07/02/2020   CL 105 07/02/2020   CALCIUM  8.8 (L) 07/02/2020   MG 2.4 (H) 06/24/2017    Bone Lab Results  Component Value Date   25OHVITD1 37 06/24/2017   25OHVITD2 2.2 06/24/2017   25OHVITD3 35 06/24/2017    Inflammation (CRP: Acute Phase) (ESR: Chronic Phase) Lab Results  Component Value Date   CRP 2.8 06/24/2017   ESRSEDRATE 11 06/24/2017         Note: Above Lab results reviewed.  Recent Imaging Review  CT CORONARY MORPH W/CTA COR W/SCORE W/CA W/CM &/OR WO/CM Addendum: ADDENDUM REPORT: 12/26/2022 00:05   EXAM:  OVER-READ INTERPRETATION  CT CHEST   The following report is an over-read performed by radiologist Dr.  Fonda [facility] on 12/26/2022. This  over-read  does not include interpretation of cardiac or coronary  anatomy or pathology. The coronary CTA interpretation by the  cardiologist is attached.   COMPARISON:  12/09/2022.   FINDINGS:  Cardiovascular:  See findings discussed in the body of the report.   Mediastinum/Nodes: No suspicious adenopathy identified. Imaged  mediastinal structures are unremarkable.   Lungs/Pleura: Imaged lungs are clear. No pleural effusion or  pneumothorax.   Upper Abdomen: No acute abnormality.   Musculoskeletal: No chest wall abnormality. There are thoracic  degenerative changes.   IMPRESSION:  No significant extracardiac incidental findings.   Electronically Signed    By: Fonda Field M.D.    On: 12/26/2022 00:05 Narrative: CLINICAL DATA:  Chest pain  EXAM: Cardiac/Coronary  CTA  TECHNIQUE: The patient was scanned on a Siemens Somatom go.Top scanner.  : A retrospective scan was triggered in the ascending thoracic aorta. Axial non-contrast 3 mm slices were carried out through  the heart. The data set was analyzed on a dedicated work station and scored using the Agatson method. Gantry rotation speed was 66 msecs and collimation was .6 mm. 100mg  of metoprolol  and 0.8 mg of sl NTG was given. The 3D data set was reconstructed in 5% intervals of the 60-95 % of the R-R cycle. Diastolic phases were analyzed on a dedicated work station using MPR, MIP and VRT modes. The patient received 80 cc of contrast.  FINDINGS: Aorta:  Normal size.  No calcifications.  No dissection.  Aortic Valve:  Trileaflet.  No calcifications.  Coronary Arteries:  Normal coronary origin.  Right dominance.  RCA is a dominant artery. There is calcified plaque proximally causing mild stenosis (25-49%).  Left main gives rise to LAD and LCX arteries. LM has no disease.  LAD has calcified plaque in the proximal and mid segment causing minimal stenosis (<25%).  LCX is a non-dominant artery.  There is no  plaque.  Other findings:  Normal pulmonary vein drainage into the left atrium.  Normal left atrial appendage without a thrombus.  Normal size of the pulmonary artery.  IMPRESSION: 1. Coronary calcium  score previously calculated. Value of 364. This was 78th percentile for age and sex matched control.  2. Normal coronary origin with right dominance.  3. Mild proximal RCA stenosis (25%).  4. Minimal proximal LAD stenosis (<25%).  5. CAD-RADS 1. Minimal non-obstructive CAD (0-24%). Consider preventive therapy and risk factor modification.  Electronically Signed: By: Redell Cave M.D. On: 12/17/2022 14:50 Note: Reviewed        Physical Exam  General appearance: Well nourished, well developed, and well hydrated. In no apparent acute distress Mental status: Alert, oriented x 3 (person, place, & time)       Respiratory: No evidence of acute respiratory distress Eyes: PERLA Vitals: BP (!) 140/96   Pulse 81   Temp (!) 97.2 F (36.2 C) (Temporal)   Resp 16   Ht 5' 10 (1.778 m)   Wt 200 lb (90.7 kg)   SpO2 98%   BMI 28.70 kg/m  BMI: Estimated body mass index is 28.7 kg/m as calculated from the following:   Height as of this encounter: 5' 10 (1.778 m).   Weight as of this encounter: 200 lb (90.7 kg). Ideal: Ideal body weight: 73 kg (160 lb 15 oz) Adjusted ideal body weight: 80.1 kg (176 lb 9 oz)  Assessment   Diagnosis Status  1. Chronic pain syndrome   2. Pharmacologic therapy   3. Chronic neck pain (1ry area of Pain) (Bilateral) (L>R)   4. Chronic shoulder pain (2ry area of Pain) (Left)   5. Encounter for medication management   6. Encounter for chronic pain management   7. Occipital neuralgia (3ry area of Pain) (Left)   8. Chronic use of opiate for therapeutic purpose   9. Lumbar facet joint syndrome (Bilateral)   10. Chronic low back pain (4th area of Pain) (Right) without sciatica    Controlled Controlled Controlled   Updated Problems: No problems  updated.  Plan of Care  Problem-specific:  Assessment and Plan    Chronic Knee Pain   He manages his persistent, non-radiating knee pain with tramadol . We discussed the potential for tramadol  tolerance and advised taking periodic drug holidays. He will continue tramadol  as needed and implement two-week drug holidays periodically to prevent tolerance.  Intermittent Lower Back Pain   He experiences localized right lower back pain triggered by bending, which resolves spontaneously without radiation, numbness,  or weakness. We discussed monitoring the symptoms and considering further evaluation if the pain persists or worsens.  General Health Maintenance   He has no adverse reactions to current medications, and his urine drug screening is normal. We discussed prescription management and the follow-up plan. We will send his prescription to the pharmacy for six months and schedule a follow-up visit in six months, advising him to call if an earlier appointment is needed.       Samuel Roberts has a current medication list which includes the following long-term medication(s): metoprolol  tartrate, pantoprazole , and [START ON 05/04/2023] tramadol .  Pharmacotherapy (Medications Ordered): Meds ordered this encounter  Medications   traMADol  (ULTRAM ) 50 MG tablet    Sig: Take 1 tablet (50 mg total) by mouth 2 (two) times daily. Each refill must last 30 days.    Dispense:  60 tablet    Refill:  5    DO NOT: delete (not duplicate); no partial-fill (will deny script to complete), no refill request (F/U required). DISPENSE: 1 day early if closed on fill date. WARN: No CNS-depressants within 8 hrs of med.   Orders:  No orders of the defined types were placed in this encounter.  Follow-up plan:   Return in about 6 months (around 10/31/2023) for Eval-day (M,W), (F2F), (MM).      Interventional Therapies  Risk Factors  Considerations:     Planned  Pending:   See above for possible orders    Under consideration:   Diagnostic left-sided CESI #3  Diagnostic right lumbar facet MBB #2    Completed:   Diagnostic right lumbar facet MBB x1 (02/09/2020) (100/100/100/90-100)  Diagnostic/therapeutic right PSIS TPI/MNB x1 (01/05/2020) (100/100/0/0)  Therapeutic left C7-T1 cervical ESI x2 (10/29/2017) (100/100/60/75)    Completed by other providers:   None at this time   Therapeutic  Palliative (PRN) options:   Palliative left-sided CESI #3 (PRN)  Palliative right lumbar facet MBB       Recent Visits No visits were found meeting these conditions. Showing recent visits within past 90 days and meeting all other requirements Today's Visits Date Type Provider Dept  04/29/23 Office Visit Tanya Glisson, MD [facility]  Showing today's visits and meeting all other requirements Future Appointments No visits were found meeting these conditions. Showing future appointments within next 90 days and meeting all other requirements  I discussed the assessment and treatment plan with the patient. The patient was provided an opportunity to ask questions and all were answered. The patient agreed with the plan and demonstrated an understanding of the instructions.  Patient advised to call back or seek an in-person evaluation if the symptoms or condition worsens.  Duration of encounter: 30 minutes.  Total time on encounter, as per AMA guidelines included both the face-to-face and non-face-to-face time personally spent by the physician and/or other qualified health care professional(s) on the day of the encounter (includes time in activities that require the physician or other qualified health care professional and does not include time in activities normally performed by clinical staff). Physician's time may include the following activities when performed: Preparing to see the patient (e.g., pre-charting review of records, searching for previously ordered imaging, lab work, and  nerve conduction tests) Review of prior analgesic pharmacotherapies. Reviewing PMP Interpreting ordered tests (e.g., lab work, imaging, nerve conduction tests) Performing post-procedure evaluations, including interpretation of diagnostic procedures Obtaining and/or reviewing separately obtained history Performing a medically appropriate examination and/or evaluation Counseling and educating the patient/family/caregiver Ordering  medications, tests, or procedures Referring and communicating with other health care professionals (when not separately reported) Documenting clinical information in the electronic or other health record Independently interpreting results (not separately reported) and communicating results to the patient/ family/caregiver Care coordination (not separately reported)  Note by: Eric DELENA Como, MD Date: 04/29/2023; Time: 9:26 AM

## 2023-04-28 NOTE — Patient Instructions (Signed)

## 2023-04-29 ENCOUNTER — Ambulatory Visit: Payer: Medicare Other | Attending: Pain Medicine | Admitting: Pain Medicine

## 2023-04-29 DIAGNOSIS — M47816 Spondylosis without myelopathy or radiculopathy, lumbar region: Secondary | ICD-10-CM | POA: Diagnosis present

## 2023-04-29 DIAGNOSIS — M25512 Pain in left shoulder: Secondary | ICD-10-CM | POA: Diagnosis present

## 2023-04-29 DIAGNOSIS — M545 Low back pain, unspecified: Secondary | ICD-10-CM | POA: Insufficient documentation

## 2023-04-29 DIAGNOSIS — Z79899 Other long term (current) drug therapy: Secondary | ICD-10-CM | POA: Diagnosis present

## 2023-04-29 DIAGNOSIS — M5481 Occipital neuralgia: Secondary | ICD-10-CM | POA: Insufficient documentation

## 2023-04-29 DIAGNOSIS — Z79891 Long term (current) use of opiate analgesic: Secondary | ICD-10-CM | POA: Diagnosis present

## 2023-04-29 DIAGNOSIS — M542 Cervicalgia: Secondary | ICD-10-CM | POA: Diagnosis present

## 2023-04-29 DIAGNOSIS — G894 Chronic pain syndrome: Secondary | ICD-10-CM | POA: Diagnosis present

## 2023-04-29 DIAGNOSIS — G8929 Other chronic pain: Secondary | ICD-10-CM | POA: Insufficient documentation

## 2023-04-29 MED ORDER — TRAMADOL HCL 50 MG PO TABS
50.0000 mg | ORAL_TABLET | Freq: Two times a day (BID) | ORAL | 5 refills | Status: DC
Start: 2023-05-04 — End: 2023-10-20

## 2023-04-29 NOTE — Progress Notes (Signed)
 Nursing Pain Medication Assessment:  Safety precautions to be maintained throughout the outpatient stay will include: orient to surroundings, keep bed in low position, maintain call bell within reach at all times, provide assistance with transfer out of bed and ambulation.  Medication Inspection Compliance: Pill count conducted under aseptic conditions, in front of the patient. Neither the pills nor the bottle was removed from the patient's sight at any time. Once count was completed pills were immediately returned to the patient in their original bottle.  Medication: Tramadol  (Ultram ) Pill/Patch Count:  9 of 60 pills remain Pill/Patch Appearance: Markings consistent with prescribed medication Bottle Appearance: Standard pharmacy container. Clearly labeled. Filled Date: 56 / 12 / 2024 Last Medication intake:  Today

## 2023-10-21 ENCOUNTER — Ambulatory Visit: Payer: Medicare Other | Attending: Pain Medicine | Admitting: Nurse Practitioner

## 2023-10-21 DIAGNOSIS — G8929 Other chronic pain: Secondary | ICD-10-CM | POA: Insufficient documentation

## 2023-10-21 DIAGNOSIS — G894 Chronic pain syndrome: Secondary | ICD-10-CM | POA: Diagnosis not present

## 2023-10-21 DIAGNOSIS — M25512 Pain in left shoulder: Secondary | ICD-10-CM | POA: Diagnosis present

## 2023-10-21 DIAGNOSIS — M47816 Spondylosis without myelopathy or radiculopathy, lumbar region: Secondary | ICD-10-CM | POA: Insufficient documentation

## 2023-10-21 DIAGNOSIS — M545 Low back pain, unspecified: Secondary | ICD-10-CM | POA: Diagnosis present

## 2023-10-21 DIAGNOSIS — Z79899 Other long term (current) drug therapy: Secondary | ICD-10-CM | POA: Diagnosis present

## 2023-10-21 DIAGNOSIS — M5481 Occipital neuralgia: Secondary | ICD-10-CM | POA: Diagnosis not present

## 2023-10-21 DIAGNOSIS — Z79891 Long term (current) use of opiate analgesic: Secondary | ICD-10-CM | POA: Diagnosis present

## 2023-10-21 DIAGNOSIS — M542 Cervicalgia: Secondary | ICD-10-CM | POA: Diagnosis present

## 2023-10-21 MED ORDER — TRAMADOL HCL 50 MG PO TABS: 50.0000 mg | ORAL_TABLET | Freq: Two times a day (BID) | ORAL | 5 refills | Status: DC

## 2023-10-21 NOTE — Patient Instructions (Signed)
Medication Rules  Applies to: All patients receiving prescriptions (written or electronic).  Pharmacy of record: Pharmacy where electronic prescriptions will be sent. If written prescriptions are taken to a different pharmacy, please inform the nursing staff. The pharmacy listed in the electronic medical record should be the one where you would like electronic prescriptions to be sent.  Prescription refills: Only during scheduled appointments. Applies to both, written and electronic prescriptions.  NOTE: The following applies primarily to controlled substances (Opioid* Pain Medications).   Patient's responsibilities: Pain Pills: Bring all pain pills to every appointment (except for procedure appointments). Pill Bottles: Bring pills in original pharmacy bottle. Always bring newest bottle. Bring bottle, even if empty. Medication refills: You are responsible for knowing and keeping track of what medications you need refilled. The day before your appointment, write a list of all prescriptions that need to be refilled. Bring that list to your appointment and give it to the admitting nurse. Prescriptions will be written only during appointments. If you forget a medication, it will not be "Called in", "Faxed", or "electronically sent". You will need to get another appointment to get these prescribed. Prescription Accuracy: You are responsible for carefully inspecting your prescriptions before leaving our office. Have the discharge nurse carefully go over each prescription with you, before taking them home. Make sure that your name is accurately spelled, that your address is correct. Check the name and dose of your medication to make sure it is accurate. Check the number of pills, and the written instructions to make sure they are clear and accurate. Make sure that you are given enough medication to last until your next medication refill appointment. Taking Medication: Take medication as prescribed. Never  take more pills than instructed. Never take medication more frequently than prescribed. Taking less pills or less frequently is permitted and encouraged, when it comes to controlled substances (written prescriptions).  Inform other Doctors: Always inform, all of your healthcare providers, of all the medications you take. Pain Medication from other Providers: You are not allowed to accept any additional pain medication from any other Doctor or Healthcare provider. There are two exceptions to this rule. (see below) In the event that you require additional pain medication, you are responsible for notifying us, as stated below. Medication Agreement: You are responsible for carefully reading and following our Medication Agreement. This must be signed before receiving any prescriptions from our practice. Safely store a copy of your signed Agreement. Violations to the Agreement will result in no further prescriptions. (Additional copies of our Medication Agreement are available upon request.) Laws, Rules, & Regulations: All patients are expected to follow all Federal and State Laws, Statutes, Rules, & Regulations. Ignorance of the Laws does not constitute a valid excuse. The use of any illegal substances is prohibited. Adopted CDC guidelines & recommendations: Target dosing levels will be at or below 60 MME/day. Use of benzodiazepines** is not recommended.  Exceptions: There are only two exceptions to the rule of not receiving pain medications from other Healthcare Providers. Exception #1 (Emergencies): In the event of an emergency (i.e.: accident requiring emergency care), you are allowed to receive additional pain medication. However, you are responsible for: As soon as you are able, call our office [phone], at any time of the day or night, and leave a message stating your name, the date and nature of the emergency, and the name and dose of the medication prescribed. In the event that your call is answered  by a member of   our staff, make sure to document and save the date, time, and the name of the person that took your information.  Exception #2 (Planned Surgery): In the event that you are scheduled by another doctor or dentist to have any type of surgery or procedure, you are allowed (for a period no longer than 30 days), to receive additional pain medication, for the acute post-op pain. However, in this case, you are responsible for picking up a copy of our "Post-op Pain Management for Surgeons" handout, and giving it to your surgeon or dentist. This document is available at our office, and does not require an appointment to obtain it. Simply go to our office during business hours (Monday-Thursday from 8:00 AM to 4:00 PM) (Friday 8:00 AM to 12:00 Noon) or if you have a scheduled appointment with us, prior to your surgery, and ask for it by name. In addition, you will need to provide us with your name, name of your surgeon, type of surgery, and date of procedure or surgery.  *Opioid medications include: morphine, codeine, oxycodone, oxymorphone, hydrocodone, hydromorphone, meperidine, tramadol, tapentadol, buprenorphine, fentanyl, methadone. **Benzodiazepine medications include: diazepam (Valium), alprazolam (Xanax), clonazepam (Klonopine), lorazepam (Ativan), clorazepate (Tranxene), chlordiazepoxide (Librium), estazolam (Prosom), oxazepam (Serax), temazepam (Restoril), triazolam (Halcion) (Last updated: 06/18/2017)  

## 2023-10-21 NOTE — Progress Notes (Signed)
 PROVIDER NOTE: Interpretation of information contained herein should be left to medically-trained personnel. Specific patient instructions are provided elsewhere under Patient Instructions section of medical record. This document was created in part using AI and STT-dictation technology, any transcriptional errors that may result from this process are unintentional.  Patient: Samuel Roberts  Service: E/M   PCP: Samuel Lenis, MD  DOB: 1955-06-28  DOS: 10/21/2023  Provider: Emmy MARLA Blanch, NP  MRN: [id number]  Delivery: Face-to-face  Specialty: Interventional Pain Management  Type: Established Patient  Setting: Ambulatory outpatient facility  Specialty designation: 09  Referring Prov.: Samuel Lenis, MD  Location: Outpatient office facility       History of present illness (HPI) [name], a 68 y.o. year old male, is here today because of his No primary diagnosis found.. Samuel Roberts primary complain today is Back Pain (Lower right ) and Knee Pain  Pertinent problems: Samuel Roberts has Carcinoma in situ of prostate; Arthropathy of knee (Right); Chronic neck pain (1ry area of pain) (Bilateral) (L>R); Chronic low back pain (4th area of pain) (Right) without sciatica; Occipital neuralgia (3ry area of pain) (Left); Chronic shoulder pain(2ry area of pain) (Left); Osteoarthritis of knees (Bilateral); Tricompartment osteoarthritis of knee (Right); Cervicalgia, and Chronic pain syndrome on their pertinent problem list  Pain Assessment: Severity of Chronic pain is reported as a 5 /10. Location: Back Right, Lower/Denies. Onset: More than a month ago. Quality: Aching, Sharp. Timing: Constant. Modifying factor(s): Tramadol  and heating pad. Vitals:  height is 5' 10 (1.778 m) and weight is 200 lb (90.7 kg). His temporal temperature is 97.6 F (36.4 C). His blood pressure is 119/76 and his pulse is 71. His respiration is 16 and oxygen saturation is 97%.  BMI: Estimated body mass index is 28.7 kg/m as  calculated from the following:   Height as of this encounter: 5' 10 (1.778 m).   Weight as of this encounter: 200 lb (90.7 kg).  Last encounter: 04/29/2023 Last procedure: Visit date not found.  Reason for encounter: medication management.  The patient indicates doing well with current medication regimen.  No side effects or adverse reaction reported to medication.  The patient has a history of chronic knee and lower back pain; however the current pain management regimen has been effective in controlling symptoms and maintaining functional mobility.  Pharmacotherapy Assessment   Tramadol  (Ultram ) 50 mg by mouth 2 times daily as needed for pain. MME=20 Monitoring: [place] PMP: PDMP reviewed during this encounter.       Pharmacotherapy: No side-effects or adverse reactions reported. Compliance: No problems identified. Effectiveness: Clinically acceptable.  Bonner Norris, RN  10/21/2023  8:17 AM  Sign when Signing Visit Nursing Pain Medication Assessment:  Safety precautions to be maintained throughout the outpatient stay will include: orient to surroundings, keep bed in low position, maintain call bell within reach at all times, provide assistance with transfer out of bed and ambulation.    Medication Inspection Compliance: Pill count conducted under aseptic conditions, in front of the patient. Neither the pills nor the bottle was removed from the patient's sight at any time. Once count was completed pills were immediately returned to the patient in their original bottle.  Medication: Tramadol  (Ultram ) Pill/Patch Count: 18 of 60 pills/patches remain. Patient states he has 6 more pill sat home in his weekly pill box.  Pill/Patch Appearance: Markings consistent with prescribed medication Bottle Appearance: Standard pharmacy container. Clearly labeled. Filled Date: 06 / 14 / 2025 Last Medication intake:  Today  UDS:  Summary  Date Value Ref Range Status  10/29/2022 Note  Final    Comment:     ==================================================================== [employer] Select 13 (MW) ==================================================================== Test                             Result       Flag       Units  Drug Present and Declared for Prescription Verification   Tramadol                        >2041        EXPECTED   ng/mg creat   O-Desmethyltramadol            >2041        EXPECTED   ng/mg creat   N-Desmethyltramadol            804          EXPECTED   ng/mg creat    Source of tramadol  is a prescription medication. O-desmethyltramadol    and N-desmethyltramadol are expected metabolites of tramadol .  ==================================================================== Test                      Result    Flag   Units      Ref Range   Creatinine              245              mg/dL      >=79 ==================================================================== Declared Medications:  The flagging and interpretation on this report are based on the  following declared medications.  Unexpected results may arise from  inaccuracies in the declared medications.   **Note: The testing scope of this panel includes these medications:   Tramadol  (Ultram )   **Note: The testing scope of this panel does not include the  following reported medications:   Finasteride  (Propecia )  Mesalamine  (Lialda )  Naloxone  (Narcan )  Pantoprazole  (Protonix ) ==================================================================== For clinical consultation, please call [phone]. ====================================================================     No results found for: CBDTHCR No results found for: D8THCCBX No results found for: D9THCCBX  ROS  Constitutional: Denies any fever or chills Gastrointestinal: No reported hemesis, hematochezia, vomiting, or acute GI distress Musculoskeletal: Low back pain radiate down to right leg, knee pain  Neurological: No reported episodes of  acute onset apraxia, aphasia, dysarthria, agnosia, amnesia, paralysis, loss of coordination, or loss of consciousness  Medication Review  ALPRAZolam, finasteride , mesalamine , metoprolol  tartrate, naloxone , pantoprazole , rosuvastatin, and traMADol   History Review  Allergy: Samuel Roberts has no known allergies. Drug: Samuel Roberts  reports no history of drug use. Alcohol:  reports no history of alcohol use. Tobacco:  reports that he has never smoked. He has never used smokeless tobacco. Social: Samuel Roberts  reports that he has never smoked. He has never used smokeless tobacco. He reports that he does not drink alcohol and does not use drugs. Medical:  has a past medical history of BPH (benign prostatic hyperplasia), Colitis, ulcerative (HCC), GERD (gastroesophageal reflux disease), and Hypertension. Surgical: Samuel Roberts  has a past surgical history that includes Knee arthroscopy and arthrotomy; Tonsillectomy; LEFT HEART CATH AND CORONARY ANGIOGRAPHY (Left, 09/09/2016); Joint replacement (Right, 02/17/2018); and LEFT HEART CATH AND CORONARY ANGIOGRAPHY (Left, 07/25/2020). Family: family history includes Cancer in his father; Cirrhosis in his brother; Diabetes in his mother, sister, and sister; Drug abuse in his brother and brother;  Heart disease in his brother and brother; Heart failure in his father and mother.  Laboratory Chemistry Profile   Renal Lab Results  Component Value Date   BUN 16 07/02/2020   CREATININE 0.74 07/02/2020   GFRAA >60 11/25/2019   GFRNONAA >60 07/02/2020    Hepatic Lab Results  Component Value Date   AST 29 07/02/2020   ALT 20 07/02/2020   ALBUMIN 3.9 07/02/2020   ALKPHOS 43 07/02/2020   LIPASE 27 07/02/2020    Electrolytes Lab Results  Component Value Date   NA 135 07/02/2020   K 3.5 07/02/2020   CL 105 07/02/2020   CALCIUM  8.8 (L) 07/02/2020   MG 2.4 (H) 06/24/2017    Bone Lab Results  Component Value Date   25OHVITD1 37 06/24/2017   25OHVITD2 2.2 06/24/2017    25OHVITD3 35 06/24/2017    Inflammation (CRP: Acute Phase) (ESR: Chronic Phase) Lab Results  Component Value Date   CRP 2.8 06/24/2017   ESRSEDRATE 11 06/24/2017         Note: Above Lab results reviewed.  Recent Imaging Review  CT CORONARY MORPH W/CTA COR W/SCORE W/CA W/CM &/OR WO/CM Addendum: ADDENDUM REPORT: 12/26/2022 00:05   EXAM:  OVER-READ INTERPRETATION  CT CHEST   The following report is an over-read performed by radiologist Dr.  Fonda Mom [facility] on 12/26/2022. This  over-read does not include interpretation of cardiac or coronary  anatomy or pathology. The coronary CTA interpretation by the  cardiologist is attached.   COMPARISON:  12/09/2022.   FINDINGS:  Cardiovascular:  See findings discussed in the body of the report.   Mediastinum/Nodes: No suspicious adenopathy identified. Imaged  mediastinal structures are unremarkable.   Lungs/Pleura: Imaged lungs are clear. No pleural effusion or  pneumothorax.   Upper Abdomen: No acute abnormality.   Musculoskeletal: No chest wall abnormality. There are thoracic  degenerative changes.   IMPRESSION:  No significant extracardiac incidental findings.   Electronically Signed    By: Fonda Field M.D.    On: 12/26/2022 00:05 Narrative: CLINICAL DATA:  Chest pain  EXAM: Cardiac/Coronary  CTA  TECHNIQUE: The patient was scanned on a Siemens Somatom go.Top scanner.  : A retrospective scan was triggered in the ascending thoracic aorta. Axial non-contrast 3 mm slices were carried out through the heart. The data set was analyzed on a dedicated work station and scored using the Agatson method. Gantry rotation speed was 66 msecs and collimation was .6 mm. 100mg  of metoprolol  and 0.8 mg of sl NTG was given. The 3D data set was reconstructed in 5% intervals of the 60-95 % of the R-R cycle. Diastolic phases were analyzed on a dedicated work station using MPR, MIP and VRT modes. The  patient received 80 cc of contrast.  FINDINGS: Aorta:  Normal size.  No calcifications.  No dissection.  Aortic Valve:  Trileaflet.  No calcifications.  Coronary Arteries:  Normal coronary origin.  Right dominance.  RCA is a dominant artery. There is calcified plaque proximally causing mild stenosis (25-49%).  Left main gives rise to LAD and LCX arteries. LM has no disease.  LAD has calcified plaque in the proximal and mid segment causing minimal stenosis (<25%).  LCX is a non-dominant artery.  There is no plaque.  Other findings:  Normal pulmonary vein drainage into the left atrium.  Normal left atrial appendage without a thrombus.  Normal size of the pulmonary artery.  IMPRESSION: 1. Coronary calcium  score previously calculated. Value of 364. This was  78th percentile for age and sex matched control.  2. Normal coronary origin with right dominance.  3. Mild proximal RCA stenosis (25%).  4. Minimal proximal LAD stenosis (<25%).  5. CAD-RADS 1. Minimal non-obstructive CAD (0-24%). Consider preventive therapy and risk factor modification.  Electronically Signed: By: Redell Cave M.D. On: 12/17/2022 14:50 Note: Reviewed        Physical Exam  Vitals: BP 119/76 (Patient Position: Sitting, Cuff Size: Normal)   Pulse 71   Temp 97.6 F (36.4 C) (Temporal)   Resp 16   Ht 5' 10 (1.778 m)   Wt 200 lb (90.7 kg)   SpO2 97%   BMI 28.70 kg/m  BMI: Estimated body mass index is 28.7 kg/m as calculated from the following:   Height as of this encounter: 5' 10 (1.778 m).   Weight as of this encounter: 200 lb (90.7 kg). Ideal: Ideal body weight: 73 kg (160 lb 15 oz) Adjusted ideal body weight: 80.1 kg (176 lb 9 oz) General appearance: Well nourished, well developed, and well hydrated. In no apparent acute distress Mental status: Alert, oriented x 3 (person, place, & time)       Respiratory: No evidence of acute respiratory distress Eyes: PERLA Assessment    Diagnosis Status  1. Chronic pain syndrome   2. Pharmacologic therapy   3. Chronic neck pain (1ry area of Pain) (Bilateral) (L>R)   4. Chronic shoulder pain (2ry area of Pain) (Left)   5. Encounter for medication management   6. Encounter for chronic pain management   7. Occipital neuralgia (3ry area of Pain) (Left)   8. Chronic use of opiate for therapeutic purpose   9. Lumbar facet joint syndrome (Bilateral)   10. Chronic low back pain (4th area of Pain) (Right) without sciatica    Controlled Controlled Controlled   Updated Problems: No problems updated.  Plan of Care  Problem-specific:  Assessment and Plan We will continue on current medication regimen.  Prescribing drug monitoring (PDMP) reviewed; findings consistent with the use of prescribed medication and no evidence of narcotic misuse or abuse. Routine UDS ordered today.  Schedule follow-up in 6 months for medication management with Daria Mcmeekin, NP.  Other new issues or problems reported to this visit.   Samuel Roberts has a current medication list which includes the following long-term medication(s): metoprolol  tartrate, pantoprazole , rosuvastatin, and tramadol .  Pharmacotherapy (Medications Ordered): No orders of the defined types were placed in this encounter.  Orders:  No orders of the defined types were placed in this encounter.       No follow-ups on file.    Recent Visits No visits were found meeting these conditions. Showing recent visits within past 90 days and meeting all other requirements Today's Visits Date Type Provider Dept  10/21/23 Office Visit Sherol Sabas K, NP [facility]  Showing today's visits and meeting all other requirements Future Appointments No visits were found meeting these conditions. Showing future appointments within next 90 days and meeting all other requirements  I discussed the assessment and treatment plan with the patient. The patient was provided an  opportunity to ask questions and all were answered. The patient agreed with the plan and demonstrated an understanding of the instructions.  Patient advised to call back or seek an in-person evaluation if the symptoms or condition worsens.  Duration of encounter: 30 minutes.  Total time on encounter, as per AMA guidelines included both the face-to-face and non-face-to-face time personally spent by the  physician and/or other qualified health care professional(s) on the day of the encounter (includes time in activities that require the physician or other qualified health care professional and does not include time in activities normally performed by clinical staff). Physician's time may include the following activities when performed: Preparing to see the patient (e.g., pre-charting review of records, searching for previously ordered imaging, lab work, and nerve conduction tests) Review of prior analgesic pharmacotherapies. Reviewing PMP Interpreting ordered tests (e.g., lab work, imaging, nerve conduction tests) Performing post-procedure evaluations, including interpretation of diagnostic procedures Obtaining and/or reviewing separately obtained history Performing a medically appropriate examination and/or evaluation Counseling and educating the patient/family/caregiver Ordering medications, tests, or procedures Referring and communicating with other health care professionals (when not separately reported) Documenting clinical information in the electronic or other health record Independently interpreting results (not separately reported) and communicating results to the patient/ family/caregiver Care coordination (not separately reported)  Note by: Asaiah Scarber K Makesha Belitz, NP (TTS and AI technology used. I apologize for any typographical errors that were not detected and corrected.) Date: 10/21/2023; Time: 8:19 AM

## 2023-10-21 NOTE — Progress Notes (Signed)
 Nursing Pain Medication Assessment:  Safety precautions to be maintained throughout the outpatient stay will include: orient to surroundings, keep bed in low position, maintain call bell within reach at all times, provide assistance with transfer out of bed and ambulation.    Medication Inspection Compliance: Pill count conducted under aseptic conditions, in front of the patient. Neither the pills nor the bottle was removed from the patient's sight at any time. Once count was completed pills were immediately returned to the patient in their original bottle.  Medication: Tramadol  (Ultram ) Pill/Patch Count: 18 of 60 pills/patches remain. Patient states he has 6 more pill sat home in his weekly pill box.  Pill/Patch Appearance: Markings consistent with prescribed medication Bottle Appearance: Standard pharmacy container. Clearly labeled. Filled Date: 06 / 14 / 2025 Last Medication intake:  Today

## 2023-10-25 LAB — TOXASSURE SELECT 13 (MW), URINE

## 2023-11-04 ENCOUNTER — Other Ambulatory Visit: Payer: Self-pay | Admitting: Nurse Practitioner

## 2024-04-07 NOTE — Progress Notes (Unsigned)
 PROVIDER NOTE: Interpretation of information contained herein should be left to medically-trained personnel. Specific patient instructions are provided elsewhere under Patient Instructions section of medical record. This document was created in part using AI and STT-dictation technology, any transcriptional errors that may result from this process are unintentional.  Patient: Samuel Roberts  Service: E/M   PCP: Samuel Lenis, MD  DOB: 08-18-1955  DOS: 04/11/2024  Provider: Emmy MARLA Blanch, NP  MRN: [id number]  Delivery: Face-to-face  Specialty: Interventional Pain Management  Type: Established Patient  Setting: Ambulatory outpatient facility  Specialty designation: 09  Referring Prov.: Samuel Lenis, MD  Location: Outpatient office facility       History of present illness (HPI) Samuel Roberts, a 67 y.o. year old male, is here today because of his No primary diagnosis found.. Samuel Roberts primary complain today is No chief complaint on file.  Pertinent problems: Samuel Roberts has Carcinoma in situ of prostate; Arthropathy of knee (Right); Chronic neck pain (1ry area of Pain) (Bilateral) (L>R); Occipital neuralgia (3ry area of Pain) (Left); Chronic shoulder pain (2ry area of Pain) (Left); Chronic low back pain (4th area of Pain) (Right) without sciatica; Chronic knee pain (5th area of Pain) (Bilateral) (R>L); Chronic pain syndrome; Long term current use of opiate analgesic; Pharmacologic therapy; Disorder of skeletal system; Problems influencing health status; Pain of left humerus; Chronic upper extremity pain (Left); DDD (degenerative disc disease), cervical; Cervical spondylosis; 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  Pain Assessment: Severity of   is reported as a  /10. Location:    / . Onset:  . Quality:  . Timing:  . Modifying factor(s):  Samuel Roberts:  Roberts were not taken for this visit.  BMI: Estimated body mass index is 28.7 kg/m as calculated from the following:   Height as of 10/21/23: 5' 10 (1.778 m).   Weight as of 10/21/23: 200 lb (90.7 kg).  Last encounter: 10/21/2023. Last procedure: 10/21/2023.  Reason for encounter:  *** .   Discussed the use of AI scribe software for clinical note transcription with the patient, who gave verbal consent to proceed.  History of Present Illness           Pharmacotherapy Assessment   Tramadol  (Ultram ) 50 mg by mouth 2 times daily as needed for pain. MME=20 Monitoring: [place] PMP: PDMP reviewed during this encounter.       Pharmacotherapy: No side-effects or adverse reactions reported. Compliance: No problems identified. Effectiveness: Clinically acceptable.  No notes on file  UDS:  Summary  Date Value Ref Range Status  10/21/2023 FINAL  Final    Comment:    ==================================================================== [employer] (MW) ==================================================================== Test                             Result       Flag       Units  Drug Present and Declared for Prescription Verification   Tramadol                        >14706       EXPECTED   ng/mg creat  O-Desmethyltramadol            >85293       EXPECTED   ng/mg creat   N-Desmethyltramadol            1106         EXPECTED   ng/mg creat    Source of tramadol  is a prescription medication. O-desmethyltramadol    and N-desmethyltramadol are expected metabolites of tramadol .  Drug Absent but Declared for Prescription Verification   Alprazolam                     Not Detected UNEXPECTED ng/mg  creat ==================================================================== Test                      Result    Flag   Units      Ref Range   Creatinine              34               mg/dL      >=79 ==================================================================== Declared Medications:  The flagging and interpretation on this report are based on the  following declared medications.  Unexpected results may arise from  inaccuracies in the declared medications.   **Note: The testing scope of this panel includes these medications:   Alprazolam  Tramadol    **Note: The testing scope of this panel does not include the  following reported medications:   Calcium   Finasteride   Mesalamine   Metoprolol   Naloxone   Pantoprazole   Rosuvastatin ==================================================================== For clinical consultation, please call [phone]. ====================================================================     No results found for: CBDTHCR No results found for: D8THCCBX No results found for: D9THCCBX  ROS  Constitutional: Denies any fever or chills Gastrointestinal: No reported hemesis, hematochezia, vomiting, or acute GI distress Musculoskeletal: Denies any acute onset joint swelling, redness, loss of ROM, or weakness Neurological: No reported episodes of acute onset apraxia, aphasia, dysarthria, agnosia, amnesia, paralysis, loss of coordination, or loss of consciousness  Medication Review  ALPRAZolam, finasteride , mesalamine , metoprolol  tartrate, pantoprazole , rosuvastatin, and traMADol   History Review  Allergy: Samuel Roberts has no known allergies. Drug: Samuel Roberts  reports no history of drug use. Alcohol:  reports no history of alcohol use. Tobacco:  reports that he has never smoked. He has never used smokeless tobacco. Social: Samuel Roberts  reports that he has never smoked. He has never used smokeless tobacco. He reports that he does not drink  alcohol and does not use drugs. Medical:  has a past medical history of BPH (benign prostatic hyperplasia), Colitis, ulcerative (HCC), GERD (gastroesophageal reflux disease), and Hypertension. Surgical: Samuel Roberts  has a past surgical history that includes Knee arthroscopy and arthrotomy; Tonsillectomy; LEFT HEART CATH AND CORONARY ANGIOGRAPHY (Left, 09/09/2016); Joint replacement (Right, 02/17/2018); and LEFT HEART CATH AND CORONARY ANGIOGRAPHY (Left, 07/25/2020). Family: family history includes Cancer in his father; Cirrhosis in his brother; Diabetes in his mother, sister, and sister; Drug abuse in his brother and brother; Heart disease in his brother and brother; Heart failure in his father and mother.  Laboratory Chemistry Profile   Renal Lab Results  Component Value Date   BUN 16 07/02/2020   CREATININE 0.74 07/02/2020   GFRAA >60 11/25/2019   GFRNONAA >60 07/02/2020    Hepatic Lab Results  Component Value Date   AST 29 07/02/2020   ALT 20 07/02/2020   ALBUMIN 3.9 07/02/2020   ALKPHOS 43 07/02/2020   LIPASE 27 07/02/2020  Electrolytes Lab Results  Component Value Date   NA 135 07/02/2020   K 3.5 07/02/2020   CL 105 07/02/2020   CALCIUM  8.8 (L) 07/02/2020   MG 2.4 (H) 06/24/2017    Bone Lab Results  Component Value Date   25OHVITD1 37 06/24/2017   25OHVITD2 2.2 06/24/2017   25OHVITD3 35 06/24/2017    Inflammation (CRP: Acute Phase) (ESR: Chronic Phase) Lab Results  Component Value Date   CRP 2.8 06/24/2017   ESRSEDRATE 11 06/24/2017         Note: Above Lab results reviewed.  Recent Imaging Review  CT CORONARY MORPH W/CTA COR W/SCORE W/CA W/CM &/OR WO/CM Addendum: ADDENDUM REPORT: 12/26/2022 00:05   EXAM:  OVER-READ INTERPRETATION  CT CHEST   The following report is an over-read performed by radiologist Dr.  Fonda Mom [facility] on 12/26/2022. This  over-read does not include interpretation of cardiac or coronary  anatomy or pathology.  The coronary CTA interpretation by the  cardiologist is attached.   COMPARISON:  12/09/2022.   FINDINGS:  Cardiovascular:  See findings discussed in the body of the report.   Mediastinum/Nodes: No suspicious adenopathy identified. Imaged  mediastinal structures are unremarkable.   Lungs/Pleura: Imaged lungs are clear. No pleural effusion or  pneumothorax.   Upper Abdomen: No acute abnormality.   Musculoskeletal: No chest wall abnormality. There are thoracic  degenerative changes.   IMPRESSION:  No significant extracardiac incidental findings.   Electronically Signed    By: Fonda Field M.D.    On: 12/26/2022 00:05 Narrative: CLINICAL DATA:  Chest pain  EXAM: Cardiac/Coronary  CTA  TECHNIQUE: The patient was scanned on a Siemens Somatom go.Top scanner.  : A retrospective scan was triggered in the ascending thoracic aorta. Axial non-contrast 3 mm slices were carried out through the heart. The data set was analyzed on a dedicated work station and scored using the Agatson method. Gantry rotation speed was 66 msecs and collimation was .6 mm. 100mg  of metoprolol  and 0.8 mg of sl NTG was given. The 3D data set was reconstructed in 5% intervals of the 60-95 % of the R-R cycle. Diastolic phases were analyzed on a dedicated work station using MPR, MIP and VRT modes. The patient received 80 cc of contrast.  FINDINGS: Aorta:  Normal size.  No calcifications.  No dissection.  Aortic Valve:  Trileaflet.  No calcifications.  Coronary Arteries:  Normal coronary origin.  Right dominance.  RCA is a dominant artery. There is calcified plaque proximally causing mild stenosis (25-49%).  Left main gives rise to LAD and LCX arteries. LM has no disease.  LAD has calcified plaque in the proximal and mid segment causing minimal stenosis (<25%).  LCX is a non-dominant artery.  There is no plaque.  Other findings:  Normal pulmonary vein drainage into the left atrium.  Normal  left atrial appendage without a thrombus.  Normal size of the pulmonary artery.  IMPRESSION: 1. Coronary calcium  score previously calculated. Value of 364. This was 78th percentile for age and sex matched control.  2. Normal coronary origin with right dominance.  3. Mild proximal RCA stenosis (25%).  4. Minimal proximal LAD stenosis (<25%).  5. CAD-RADS 1. Minimal non-obstructive CAD (0-24%). Consider preventive therapy and risk factor modification.  Electronically Signed: By: Redell Cave M.D. On: 12/17/2022 14:50 Note: Reviewed        Physical Exam  Roberts: There were no Roberts taken for this visit. BMI: Estimated body mass index is 28.7 kg/m  as calculated from the following:   Height as of 10/21/23: 5' 10 (1.778 m).   Weight as of 10/21/23: 200 lb (90.7 kg). Ideal: Patient weight not recorded General appearance: Well nourished, well developed, and well hydrated. In no apparent acute distress Mental status: Alert, oriented x 3 (person, place, & time)       Respiratory: No evidence of acute respiratory distress Eyes: PERLA   Assessment   Diagnosis Status  1. Chronic pain syndrome   2. Pharmacologic therapy   3. Chronic neck pain (1ry area of Pain) (Bilateral) (L>R)   4. Chronic shoulder pain (2ry area of Pain) (Left)   5. Encounter for medication management   6. Encounter for chronic pain management   7. Occipital neuralgia (3ry area of Pain) (Left)   8. Chronic use of opiate for therapeutic purpose   9. Lumbar facet joint syndrome (Bilateral)   10. Chronic low back pain (4th area of Pain) (Right) without sciatica    Controlled Controlled Controlled   Updated Problems: No problems updated.  Plan of Care  Problem-specific:  Assessment and Plan            Mr. JACOBEY GURA has a current medication list which includes the following long-term medication(s): metoprolol  tartrate, pantoprazole , rosuvastatin, and tramadol .  Pharmacotherapy (Medications  Ordered): No orders of the defined types were placed in this encounter.  Orders:  No orders of the defined types were placed in this encounter.    {There is no content from the last Plan section.}   No follow-ups on file.    Recent Visits No visits were found meeting these conditions. Showing recent visits within past 90 days and meeting all other requirements Future Appointments Date Type Provider Dept  04/11/24 Appointment Euan Wandler K, NP [facility]  Showing future appointments within next 90 days and meeting all other requirements  I discussed the assessment and treatment plan with the patient. The patient was provided an opportunity to ask questions and all were answered. The patient agreed with the plan and demonstrated an understanding of the instructions.  Patient advised to call back or seek an in-person evaluation if the symptoms or condition worsens.  Duration of encounter: *** minutes.  Total time on encounter, as per AMA guidelines included both the face-to-face and non-face-to-face time personally spent by the physician and/or other qualified health care professional(s) on the day of the encounter (includes time in activities that require the physician or other qualified health care professional and does not include time in activities normally performed by clinical staff). Physician's time may include the following activities when performed: Preparing to see the patient (e.g., pre-charting review of records, searching for previously ordered imaging, lab work, and nerve conduction tests) Review of prior analgesic pharmacotherapies. Reviewing PMP Interpreting ordered tests (e.g., lab work, imaging, nerve conduction tests) Performing post-procedure evaluations, including interpretation of diagnostic procedures Obtaining and/or reviewing separately obtained history Performing a medically appropriate examination and/or evaluation Counseling and educating the  patient/family/caregiver Ordering medications, tests, or procedures Referring and communicating with other health care professionals (when not separately reported) Documenting clinical information in the electronic or other health record Independently interpreting results (not separately reported) and communicating results to the patient/ family/caregiver Care coordination (not separately reported)  Note by: Marysa Wessner K Amylia Collazos, NP (TTS and AI technology used. I apologize for any typographical errors that were not detected and corrected.) Date: 04/11/2024; Time: 8:05 AM

## 2024-04-11 ENCOUNTER — Encounter: Payer: Self-pay | Admitting: Nurse Practitioner

## 2024-04-11 ENCOUNTER — Ambulatory Visit: Attending: Nurse Practitioner | Admitting: Nurse Practitioner

## 2024-04-11 DIAGNOSIS — M545 Low back pain, unspecified: Secondary | ICD-10-CM | POA: Insufficient documentation

## 2024-04-11 DIAGNOSIS — M5481 Occipital neuralgia: Secondary | ICD-10-CM | POA: Diagnosis not present

## 2024-04-11 DIAGNOSIS — M25512 Pain in left shoulder: Secondary | ICD-10-CM | POA: Diagnosis not present

## 2024-04-11 DIAGNOSIS — G8929 Other chronic pain: Secondary | ICD-10-CM | POA: Diagnosis present

## 2024-04-11 DIAGNOSIS — Z79899 Other long term (current) drug therapy: Secondary | ICD-10-CM | POA: Diagnosis not present

## 2024-04-11 DIAGNOSIS — M47816 Spondylosis without myelopathy or radiculopathy, lumbar region: Secondary | ICD-10-CM | POA: Diagnosis not present

## 2024-04-11 DIAGNOSIS — Z79891 Long term (current) use of opiate analgesic: Secondary | ICD-10-CM | POA: Insufficient documentation

## 2024-04-11 DIAGNOSIS — M542 Cervicalgia: Secondary | ICD-10-CM | POA: Insufficient documentation

## 2024-04-11 DIAGNOSIS — G894 Chronic pain syndrome: Secondary | ICD-10-CM | POA: Diagnosis not present

## 2024-04-11 MED ORDER — TRAMADOL HCL 50 MG PO TABS: 50.0000 mg | ORAL_TABLET | Freq: Two times a day (BID) | ORAL | 5 refills | Status: AC

## 2024-04-11 NOTE — Progress Notes (Signed)
 Nursing Pain Medication Assessment:  Safety precautions to be maintained throughout the outpatient stay will include: orient to surroundings, keep bed in low position, maintain call bell within reach at all times, provide assistance with transfer out of bed and ambulation.  Medication Inspection Compliance: Pill count conducted under aseptic conditions, in front of the patient. Neither the pills nor the bottle was removed from the patient's sight at any time. Once count was completed pills were immediately returned to the patient in their original bottle.  Medication: Tramadol  (Ultram ) Pill/Patch Count: 43 of 60 pills/patches remain Pill/Patch Appearance: Markings consistent with prescribed medication Bottle Appearance: Standard pharmacy container. Clearly labeled. Filled Date: 66 / 71 / 2025 Last Medication intake:  Today

## 2024-10-03 ENCOUNTER — Encounter: Admitting: Nurse Practitioner
